# Patient Record
Sex: Female | Born: 1955
Health system: Southern US, Community
[De-identification: ages and names within clinical notes are randomized; demographics above are authoritative.]

## PROBLEM LIST (undated history)

## (undated) DIAGNOSIS — R32 Unspecified urinary incontinence: Secondary | ICD-10-CM

## (undated) DIAGNOSIS — I10 Essential (primary) hypertension: Secondary | ICD-10-CM

## (undated) DIAGNOSIS — E611 Iron deficiency: Secondary | ICD-10-CM

## (undated) DIAGNOSIS — Z6791 Unspecified blood type, Rh negative: Secondary | ICD-10-CM

## (undated) DIAGNOSIS — M1990 Unspecified osteoarthritis, unspecified site: Secondary | ICD-10-CM

## (undated) DIAGNOSIS — B019 Varicella without complication: Secondary | ICD-10-CM

## (undated) DIAGNOSIS — K219 Gastro-esophageal reflux disease without esophagitis: Secondary | ICD-10-CM

## (undated) HISTORY — DX: Unspecified osteoarthritis, unspecified site: M19.90

## (undated) HISTORY — PX: TUBAL LIGATION: SHX77

## (undated) HISTORY — DX: Varicella without complication: B01.9

## (undated) HISTORY — DX: Essential (primary) hypertension: I10

## (undated) HISTORY — DX: Unspecified blood type, rh negative: Z67.91

## (undated) HISTORY — DX: Gastro-esophageal reflux disease without esophagitis: K21.9

## (undated) HISTORY — DX: Iron deficiency: E61.1

## (undated) HISTORY — DX: Unspecified urinary incontinence: R32

---

## 2005-06-26 ENCOUNTER — Other Ambulatory Visit: Admission: RE | Admit: 2005-06-26 | Discharge: 2005-06-26 | Payer: Self-pay | Admitting: Family Medicine

## 2007-03-06 ENCOUNTER — Encounter: Admission: RE | Admit: 2007-03-06 | Discharge: 2007-03-06 | Payer: Self-pay | Admitting: Family Medicine

## 2007-04-02 ENCOUNTER — Encounter: Admission: RE | Admit: 2007-04-02 | Discharge: 2007-04-02 | Payer: Self-pay | Admitting: Family Medicine

## 2008-03-12 ENCOUNTER — Encounter: Admission: RE | Admit: 2008-03-12 | Discharge: 2008-03-12 | Payer: Self-pay | Admitting: Family Medicine

## 2009-03-14 ENCOUNTER — Encounter: Admission: RE | Admit: 2009-03-14 | Discharge: 2009-03-14 | Payer: Self-pay | Admitting: Family Medicine

## 2009-03-17 ENCOUNTER — Encounter: Admission: RE | Admit: 2009-03-17 | Discharge: 2009-03-17 | Payer: Self-pay | Admitting: Family Medicine

## 2010-03-20 ENCOUNTER — Encounter: Admission: RE | Admit: 2010-03-20 | Discharge: 2010-03-20 | Payer: Self-pay | Admitting: Family Medicine

## 2011-02-14 ENCOUNTER — Other Ambulatory Visit: Payer: Self-pay | Admitting: Family Medicine

## 2011-02-14 DIAGNOSIS — Z1231 Encounter for screening mammogram for malignant neoplasm of breast: Secondary | ICD-10-CM

## 2011-03-22 ENCOUNTER — Ambulatory Visit
Admission: RE | Admit: 2011-03-22 | Discharge: 2011-03-22 | Disposition: A | Discharge status: Home / Self Care | Payer: 59 | Source: Ambulatory Visit | Attending: Family Medicine | Admitting: Family Medicine

## 2011-03-22 DIAGNOSIS — Z1231 Encounter for screening mammogram for malignant neoplasm of breast: Secondary | ICD-10-CM

## 2011-03-23 ENCOUNTER — Other Ambulatory Visit: Payer: Self-pay | Admitting: Family Medicine

## 2011-03-23 DIAGNOSIS — N63 Unspecified lump in unspecified breast: Secondary | ICD-10-CM

## 2011-03-29 ENCOUNTER — Ambulatory Visit
Admission: RE | Admit: 2011-03-29 | Discharge: 2011-03-29 | Disposition: A | Discharge status: Home / Self Care | Payer: 59 | Source: Ambulatory Visit | Attending: Family Medicine | Admitting: Family Medicine

## 2011-03-29 ENCOUNTER — Other Ambulatory Visit: Payer: Self-pay | Admitting: Family Medicine

## 2011-03-29 DIAGNOSIS — N63 Unspecified lump in unspecified breast: Secondary | ICD-10-CM

## 2012-01-09 ENCOUNTER — Encounter: Payer: Self-pay | Admitting: Obstetrics and Gynecology

## 2012-01-09 ENCOUNTER — Ambulatory Visit (INDEPENDENT_AMBULATORY_CARE_PROVIDER_SITE_OTHER): Payer: 59 | Admitting: Obstetrics and Gynecology

## 2012-01-09 VITALS — BP 122/80 | Temp 98.9°F | Resp 16 | Ht 62.0 in | Wt 132.0 lb

## 2012-01-09 DIAGNOSIS — D219 Benign neoplasm of connective and other soft tissue, unspecified: Secondary | ICD-10-CM

## 2012-01-09 DIAGNOSIS — D259 Leiomyoma of uterus, unspecified: Secondary | ICD-10-CM

## 2012-01-09 NOTE — Patient Instructions (Addendum)
Call in 1 week for followup       Fibroids You have been diagnosed as having a fibroid. Fibroids are smooth muscle lumps (tumors) which can occur any place in a woman's body. They are usually in the womb (uterus). The most common problem (symptom) of fibroids is bleeding. Over time this may cause low red blood cells (anemia). Other symptoms include feelings of pressure and pain in the pelvis. The diagnosis (learning what is wrong) of fibroids is made by physical exam. Sometimes tests such as an ultrasound are used. This is helpful when fibroids are felt around the ovaries and to look for tumors. TREATMENT   Most fibroids do not need surgical or medical treatment. Sometimes a tissue sample (biopsy) of the lining of the uterus is done to rule out cancer. If there is no cancer and only a small amount of bleeding, the problem can be watched.   Hormonal treatment can improve the problem.   When surgery is needed, it can consist of removing the fibroid. Vaginal birth may not be possible after the removal of fibroids. This depends on where they are and the extent of surgery. When pregnancy occurs with fibroids it is usually normal.   Your caregiver can help decide which treatments are best for you.  HOME CARE INSTRUCTIONS   Do not use aspirin as this may increase bleeding problems.   If your periods (menses) are heavy, record the number of pads or tampons used per month. Bring this information to your caregiver. This can help them determine the best treatment for you.  SEEK IMMEDIATE MEDICAL CARE IF:  You have pelvic pain or cramps not controlled with medications, or experience a sudden increase in pain.   You have an increase of pelvic bleeding between and during menses.   You feel lightheaded or have fainting spells.   You develop worsening belly (abdominal) pain.  Document Released: 08/10/2000 Document Revised: 08/02/2011 Document Reviewed: 04/01/2008 [facility] Patient Information 2012  [place], [place].

## 2012-01-09 NOTE — Progress Notes (Signed)
Vag. Discharge:no Odor:no Fever:no Irreg.Periods:yes Dyspareunia:yes Dysuria:no Frequency:no Urgency:no Hematuria:no Kidney stones:no Constipation:no Diarrhea:no Rectal Bleeding: no Vomiting:no Nausea:no Pregnant:no Fibroids:yes Endometriosis:no Hx of Ovarian Cyst:no Hx IUD:no Hx STD-PID:no Appendectomy:no Gall Bladder Dz:no  Subjective:    Samantha Becker is a 56 y.o. female G4P4 who presents for annual exam.  The patient complains of a 3 month history of deep dyspareunia. She had had some vaginal dryness over the last year or so but had managed this symptom with vaginal lubrication. More recently however the patient has experienced severe pain on deep penetration. She saw her primary care physician at [facility] family practice who ordered an ultrasound that showed fibroids. Her last menstrual period begin on 113 and was very heavy and long her previous menstrual period had been in October of 2011. She had no intermenstrual bleeding she has noticed increased constipation since menopause began. She denies any rectal bleeding. She denies nausea or vomiting. She does have urinary frequency and some mild stress urinary incontinence.  The following portions of the patient's history were reviewed and updated as appropriate: allergies, current medications, past family history, past medical history, past social history, past surgical history and problem list.  Review of Systems Pertinent items are noted in HPI. Gastrointestinal:No change in bowel habits, no abdominal pain, no rectal bleeding Genitourinary:negative for dysuria, frequency, hematuria, nocturia and urinary incontinence    Objective:     BP 122/80  Temp 98.9 F (37.2 C)  Resp 16  Ht 5\' 2"  (1.575 m)  Wt 132 lb (59.875 kg)  BMI 24.14 kg/m2  Weight:  Wt Readings from Last 1 Encounters:  01/09/12 132 lb (59.875 kg)     BMI: Body mass index is 24.14 kg/(m^2). General Appearance: Alert, appropriate appearance for  age. No acute distress HEENT: Grossly normal Neck / Thyroid: Supple, no masses, nodes or enlargement Lungs: clear to auscultation bilaterally Back: No CVA tenderness Breast Exam: No masses or nodes.No dimpling, nipple retraction or discharge. Cardiovascular: Regular rate and rhythm. S1, S2, no murmur Gastrointestinal: Soft, non-tender, no masses or organomegaly Pelvic Exam: External genitalia: normal general appearance Vaginal: normal mucosa without prolapse or lesions Cervix: normal appearance Adnexa: non palpable Uterus: enlarged and nontender and mobile Rectovaginal: no masses Lymphatic Exam: Non-palpable nodes in neck, clavicular, axillary, or inguinal regions  Skin: no rash or abnormalities Neurologic: Normal gait and speech, no tremor  Psychiatric: Alert and oriented, appropriate affect.    Urinalysis:Not done      Assessment:    Fibroids by history with recent onset of dyspareunia. This is out of proportion to uterine size noted on pelvic examination.    Plan:    Release of information for ultrasound obtained at [facility] was sent. This will be reviewed and further followup recommended based on that finding.  pap Follow-up:  prn

## 2012-01-12 LAB — PAP IG AND HPV HIGH-RISK: HPV DNA High Risk: NOT DETECTED

## 2012-01-24 ENCOUNTER — Telehealth: Payer: Self-pay | Admitting: Obstetrics and Gynecology

## 2012-01-24 NOTE — Telephone Encounter (Signed)
chandra/epic 

## 2012-01-25 NOTE — Telephone Encounter (Signed)
Lm on vm to cb per telephone call.  

## 2012-01-25 NOTE — Telephone Encounter (Signed)
Tc from pt rgdg ultrasound results ordered by Dr.Meyers. Pt wants vh to review ultrasound and call back with any further recs. Will consult with vph and cb with recs. Pt agrees. Pt was informed that no cyst was found on ovaries and small fibroids present. Pt with h/o fibroids and voices understanding.

## 2012-01-29 NOTE — Telephone Encounter (Signed)
Will make vph aware.

## 2012-01-29 NOTE — Telephone Encounter (Signed)
Lm on vm to cb per vph recs.  

## 2012-01-29 NOTE — Telephone Encounter (Signed)
Tc from pt. Told pt recs from vph as follows: Ultrasound are very small and probably do not accounted for the Discomfort with intercourse. I would like to have the patient consider vaginal estrogen in the form of Vagifem 10 mcg vaginally 2 times per week for 6 weeks. If the patient would prefer to discuss the pros and cons of this recommendation please make an appointment for her if she wishes to proceed with this recommendation please call in a prescription for #8 with 3 refills and make an appointment for 6 weeks for her. Pt states,"this discomfort is coming from something that her husbands penis touches with intercourse. Pt has tried Premarin cream in past w/o improvement. Will consult with vph per recs. Pt agrees

## 2012-01-31 NOTE — Telephone Encounter (Signed)
Tc to pt per vph recs. Appt sched 02/01/12@11 :00 with vph for eval. Lm on pt's to make aware.

## 2012-02-01 ENCOUNTER — Encounter: Payer: Self-pay | Admitting: Obstetrics and Gynecology

## 2012-02-01 ENCOUNTER — Ambulatory Visit (INDEPENDENT_AMBULATORY_CARE_PROVIDER_SITE_OTHER): Payer: 59 | Admitting: Obstetrics and Gynecology

## 2012-02-01 VITALS — BP 124/72 | Temp 98.7°F | Ht 62.0 in | Wt 132.0 lb

## 2012-02-01 DIAGNOSIS — IMO0002 Reserved for concepts with insufficient information to code with codable children: Secondary | ICD-10-CM | POA: Insufficient documentation

## 2012-02-01 DIAGNOSIS — K5900 Constipation, unspecified: Secondary | ICD-10-CM

## 2012-02-01 MED ORDER — POLYETHYLENE GLYCOL 3350 17 GM/SCOOP PO POWD: 17.0000 g | Freq: Every day | ORAL | Status: AC

## 2012-02-01 MED ORDER — POLYETHYLENE GLYCOL 3350 17 GM/SCOOP PO POWD: 17.0000 g | Freq: Every day | ORAL | Status: DC

## 2012-02-01 NOTE — Patient Instructions (Signed)
Miralax over the counter daily.

## 2012-02-01 NOTE — Progress Notes (Signed)
Pt here to discuss dyspareunia. Present in supine position for intercourse, but not in the sitting or prone position. New bowel changes occurred at about the same time as onset of dyspareunia.  Now has daily BMs but they are hard pellets. Has had BM this am.  OBJECTIVE: BP 124/72  Temp(Src) 98.7 F (37.1 C) (Oral)  Ht 5\' 2"  (1.575 m)  Wt 132 lb (59.875 kg)  BMI 24.14 kg/m2 Pelvic exam: VULVA: normal appearing vulva with no masses, tenderness or lesions,    VAGINA: normal appearing vagina with normal color and discharge, no lesions,    CERVIX: normal appearing cervix without discharge or lesions, lesion present at 6 o'clock position, bluish in color, and pt states is tender   UTERUS: upper limits of normal size, irregular, mobile, tender to palpation of cervix,no uterosacral tenderness    ADNEXA: no masses,    RECTAL: normal rectal, no masses, no rectocele, firm stool palpated in the lumen and in more superior area of the sigmoid.  Procedure:  Excisional biopsy of blueish lesion with Kevorkian forceps, after prep with Betadine.  AgNO3 for hemostasis  IMPRESSION: Dyspareunia out of proportion to anatomic lesion of fibroids Cannot rule out comstipation with tender residual stool as etiology Cervical lesion, r/o endometriosis.  Excisional biopsy done.  RECOMMENDATIONS: Miralax daily. Decrease to qod if diarrhea occurs. Cx Bx done. F/U 4 weeks

## 2012-02-05 LAB — PATHOLOGY

## 2012-02-11 ENCOUNTER — Telehealth: Payer: Self-pay | Admitting: Obstetrics and Gynecology

## 2012-02-11 NOTE — Telephone Encounter (Signed)
Chandra/tst res. °

## 2012-02-12 NOTE — Telephone Encounter (Signed)
Lm on vm to cb per telephone call.  

## 2012-02-12 NOTE — Telephone Encounter (Signed)
Tc from pt. Pt told cervical biopsy=negative for malignancy. Will consult with vph rgdg any additional recs for cervical biopsy. Pt voices understanding.

## 2012-02-25 ENCOUNTER — Telehealth: Payer: Self-pay

## 2012-02-25 NOTE — Telephone Encounter (Signed)
Tc to pt per vph recs. Pt informed with discuss plan of care at 02-26-12 OV. Cervical biopsy was performed to determine if endometriosis present and it was not. Pt voices understanding.

## 2012-02-26 ENCOUNTER — Encounter: Payer: Self-pay | Admitting: Obstetrics and Gynecology

## 2012-02-26 ENCOUNTER — Ambulatory Visit (INDEPENDENT_AMBULATORY_CARE_PROVIDER_SITE_OTHER): Payer: 59 | Admitting: Obstetrics and Gynecology

## 2012-02-26 VITALS — BP 122/62 | Ht 62.0 in | Wt 134.0 lb

## 2012-02-26 DIAGNOSIS — D219 Benign neoplasm of connective and other soft tissue, unspecified: Secondary | ICD-10-CM

## 2012-02-26 DIAGNOSIS — D259 Leiomyoma of uterus, unspecified: Secondary | ICD-10-CM

## 2012-02-26 DIAGNOSIS — IMO0002 Reserved for concepts with insufficient information to code with codable children: Secondary | ICD-10-CM

## 2012-02-26 NOTE — Progress Notes (Signed)
Pt here to f/u on visit from 02/01/2012 for dyspareunia. States that she is still having the same issues even after improved bowel function  PATHOLOGY REPORT Specimen shows benign cervical mucosa. The subepithelial tissue shows benign endocervical glands, mild chronic inflammation, hemosiderin, and hemosiderin-laden macrophages. No definite endometrial glands or endometrial stromal cells are identified.   ULTRASOUND Small (7mm) fibroids, normal ovaries  IMPRESSION Deep dyspareunia of uncertain etiology No clear evidence of endometriosis though cervical biopsy suggestive.  RECOMMENDATION We had a prolonged discussion about these complex clinical issues and went over the various important aspects to consider. All questions were answered. Options of laparoscopy with resection of as much endometriosis as possible, and presumptive medical therapy of endometriosis reviewed.  The pt wants laparoscopy.  Risks of anesthesia, bleeding , infection, damage to adjacent organs, as well as the risk of failing to find an etiology for her dyspareunia were reviewed.  She gave the following schedule for timing her surgery: Teaching M-Th 7/22thru 8-2   [id number]   Starts back to [id number] Will schedule Printed information given

## 2012-02-27 ENCOUNTER — Telehealth: Payer: Self-pay | Admitting: Obstetrics and Gynecology

## 2012-02-27 ENCOUNTER — Other Ambulatory Visit: Payer: Self-pay | Admitting: Obstetrics and Gynecology

## 2012-02-27 NOTE — Telephone Encounter (Signed)
Operative Laparoscopy Scheduled for 03/04/12 @ 1:30 with [facility]. [employer] effective 08/27/09; pays 80/20 after a $2,850 deductible. Pre-op due $468.47; paid 02/27/12. Merita Norton Pridgen

## 2012-03-01 ENCOUNTER — Encounter (HOSPITAL_COMMUNITY): Payer: Self-pay | Admitting: Pharmacist

## 2012-03-03 ENCOUNTER — Encounter (HOSPITAL_COMMUNITY): Payer: Self-pay

## 2012-03-03 ENCOUNTER — Encounter (HOSPITAL_COMMUNITY)
Admission: RE | Admit: 2012-03-03 | Discharge: 2012-03-03 | Disposition: A | Discharge status: Home / Self Care | Payer: 59 | Source: Ambulatory Visit | Attending: Obstetrics and Gynecology | Admitting: Obstetrics and Gynecology

## 2012-03-03 LAB — SURGICAL PCR SCREEN
MRSA, PCR: NEGATIVE
Staphylococcus aureus: NEGATIVE

## 2012-03-03 LAB — CBC
MCH: 28.2 pg (ref 26.0–34.0)
MCV: 87.5 fL (ref 78.0–100.0)
Platelets: 230 10*3/uL (ref 150–400)
RDW: 13 % (ref 11.5–15.5)
WBC: 6.6 10*3/uL (ref 4.0–10.5)

## 2012-03-03 NOTE — H&P (Signed)
Samantha Becker is an 56 y.o. female. Who presents for further evaluation of deep dyspareunia of less than 6 months duration that has gradually worsened. She had her LMP 1/13, BUT PMP had been 10/11 with some vaginal dryness after that which responded to OTC lubricants.  The sharp pain noted on deep penetration is very different than that. Ultrasound ordered by her primary care physician showed a small uterine fibroid.  Improved bowel function with fiber addition has not helped.    Pertinent Gynecological History: Menses: see above Bleeding: None since 1/13 Contraception: tubal ligation DES exposure: unknown Blood transfusions: none Sexually transmitted diseases: no past history Previous GYN Procedures: 4 cesarean sections   Last mammogram: Left breast with simple cyst and both breasts heterogeneously dense Date: July 2012 Last pap: Normal with negative HR HPV May 2013 OB History: G4, P4   Menstrual History: Menarche age: 45 LMP  1/13, PMP 10/11    Past Medical History  Diagnosis Date  . Low iron   . Leaking of urine   . Chicken pox   . Mumps   . Arthritis   . Blood type, Rh negative     Past Surgical History  Procedure Date  . Cesarean section     Family History  Problem Relation Age of Onset  . Heart disease Father   . Hypertension Father   . Heart disease Mother   . Cancer Mother     Breast  . Hypertension Mother   . Arthritis Mother     in hands.    Social History:  reports that she has never smoked. She has never used smokeless tobacco. She reports that she drinks about 1.2 ounces of alcohol per week. She reports that she does not use illicit drugs.  Allergies: No Known Allergies  No prescriptions prior to admission    Review of Systems  Constitutional: Negative.   HENT: Negative.   Eyes: Negative.   Respiratory: Negative.   Cardiovascular: Negative.   Gastrointestinal: Negative.   Genitourinary:       Deep dyspareunia  Musculoskeletal:   Pelvic pain with intercourse can radiate into back  Skin: Negative.   Neurological: Negative.   Endo/Heme/Allergies: Negative.     Blood pressure 137/80, pulse 73, temperature 98.7 F (37.1 C), temperature source Oral, resp. rate 16, SpO2 98.00%. Physical Exam  Constitutional: She is oriented to person, place, and time. She appears well-developed and well-nourished.  HENT:  Head: Normocephalic and atraumatic.  Eyes: Conjunctivae and EOM are normal.  Neck: Normal range of motion. Neck supple.  Cardiovascular: Normal rate, regular rhythm and normal heart sounds.   Respiratory: Effort normal and breath sounds normal.  GI: Soft. Bowel sounds are normal.  Genitourinary: Vagina normal and uterus normal.       Cervix with blue lesion, negative for endometriosis on biopsy Posterior cervical tenderness to deep palpation No specific uterosacral nodulatity No rectovaginal septal masses  Musculoskeletal: Normal range of motion.  Neurological: She is alert and oriented to person, place, and time.  Skin: Skin is warm and dry.  Psychiatric: She has a normal mood and affect.    No results found for this or any previous visit (from the past 24 hour(s)).  PATHOLOGY REPORT cervical biopsy Specimen shows benign cervical mucosa. The subepithelial tissue shows benign endocervical glands, mild chronic inflammation, hemosiderin, and hemosiderin-laden macrophages. No definite endometrial glands or endometrial stromal cells are identified.   ULTRASOUND  Small (7mm) fibroids, normal ovaries   IMPRESSION  Deep dyspareunia of  uncertain etiology  No clear evidence of endometriosis though cervical biopsy suggestive.   RECOMMENDATION  We had a prolonged discussion about these complex clinical issues and went over the various important aspects to consider. All questions were answered.  Options of laparoscopy with resection of as much endometriosis as possible, and presumptive medical therapy of  endometriosis reviewed. The pt wants laparoscopy. Risks of anesthesia, bleeding , infection, damage to adjacent organs, as well as the risk of failing to find an etiology for her dyspareunia were reviewed.   Dejanay Wamboldt P 03/04/2012, 3:22 PM

## 2012-03-03 NOTE — Patient Instructions (Addendum)
YOUR PROCEDURE IS SCHEDULED ON:03/04/12  [facility] AT:1200 pm  USE DESK PHONE AND DIAL [id number] TO INFORM us OF YOUR ARRIVAL  CALL [phone] IF YOU HAVE ANY QUESTIONS OR PROBLEMS PRIOR TO YOUR ARRIVAL.  REMEMBER: DO NOT EAT AFTER MIDNIGHT : tonight   SPECIAL INSTRUCTIONS:clear liquids ok until 9am on Tuesday   YOU MAY BRUSH YOUR TEETH THE MORNING OF SURGERY   TAKE THESE MEDICINES THE DAY OF SURGERY WITH SIP OF WATER: none   DO NOT WEAR JEWELRY, EYE MAKEUP, LIPSTICK OR DARK FINGERNAIL POLISH DO NOT WEAR LOTIONS  DO NOT SHAVE FOR 48 HOURS PRIOR TO SURGERY  YOU WILL NOT BE ALLOWED TO DRIVE YOURSELF HOME.

## 2012-03-04 ENCOUNTER — Ambulatory Visit (HOSPITAL_COMMUNITY): Payer: 59 | Admitting: Anesthesiology

## 2012-03-04 ENCOUNTER — Encounter (HOSPITAL_COMMUNITY)
Admission: RE | Disposition: A | Discharge status: Home / Self Care | Payer: Self-pay | Source: Ambulatory Visit | Attending: Obstetrics and Gynecology

## 2012-03-04 ENCOUNTER — Ambulatory Visit (HOSPITAL_COMMUNITY)
Admission: RE | Admit: 2012-03-04 | Discharge: 2012-03-04 | Disposition: A | Discharge status: Home / Self Care | Payer: 59 | Source: Ambulatory Visit | Attending: Obstetrics and Gynecology | Admitting: Obstetrics and Gynecology

## 2012-03-04 ENCOUNTER — Encounter (HOSPITAL_COMMUNITY): Payer: Self-pay | Admitting: Anesthesiology

## 2012-03-04 ENCOUNTER — Encounter (HOSPITAL_COMMUNITY): Payer: Self-pay | Admitting: Obstetrics and Gynecology

## 2012-03-04 DIAGNOSIS — Z01818 Encounter for other preprocedural examination: Secondary | ICD-10-CM | POA: Insufficient documentation

## 2012-03-04 DIAGNOSIS — D259 Leiomyoma of uterus, unspecified: Secondary | ICD-10-CM | POA: Insufficient documentation

## 2012-03-04 DIAGNOSIS — IMO0002 Reserved for concepts with insufficient information to code with codable children: Secondary | ICD-10-CM

## 2012-03-04 DIAGNOSIS — Z01812 Encounter for preprocedural laboratory examination: Secondary | ICD-10-CM | POA: Insufficient documentation

## 2012-03-04 HISTORY — PX: LAPAROSCOPY: SHX197

## 2012-03-04 SURGERY — LAPAROSCOPY OPERATIVE
Anesthesia: General | Site: Abdomen | Wound class: Clean

## 2012-03-04 MED ORDER — DEXAMETHASONE SODIUM PHOSPHATE 10 MG/ML IJ SOLN
INTRAMUSCULAR | Status: AC
  Filled 2012-03-04: qty 1

## 2012-03-04 MED ORDER — ROCURONIUM BROMIDE 100 MG/10ML IV SOLN
INTRAVENOUS | Status: DC | PRN
  Administered 2012-03-04: 5 mg via INTRAVENOUS
  Administered 2012-03-04: 30 mg via INTRAVENOUS
  Administered 2012-03-04: 5 mg via INTRAVENOUS

## 2012-03-04 MED ORDER — GLYCOPYRROLATE 0.2 MG/ML IJ SOLN
INTRAMUSCULAR | Status: AC
  Filled 2012-03-04: qty 1

## 2012-03-04 MED ORDER — IBUPROFEN 600 MG PO TABS: ORAL_TABLET | ORAL | Status: DC

## 2012-03-04 MED ORDER — ONDANSETRON 4 MG PO TBDP
4.0000 mg | ORAL_TABLET | Freq: Once | ORAL | Status: AC
  Administered 2012-03-04: 4 mg via ORAL

## 2012-03-04 MED ORDER — FENTANYL CITRATE 0.05 MG/ML IJ SOLN
INTRAMUSCULAR | Status: DC | PRN
  Administered 2012-03-04: 150 ug via INTRAVENOUS
  Administered 2012-03-04 (×2): 50 ug via INTRAVENOUS

## 2012-03-04 MED ORDER — SCOPOLAMINE 1 MG/3DAYS TD PT72
1.0000 | MEDICATED_PATCH | TRANSDERMAL | Status: DC
  Administered 2012-03-04: 1.5 mg via TRANSDERMAL

## 2012-03-04 MED ORDER — ONDANSETRON HCL 4 MG/2ML IJ SOLN
INTRAMUSCULAR | Status: DC | PRN
  Administered 2012-03-04: 4 mg via INTRAVENOUS

## 2012-03-04 MED ORDER — KETOROLAC TROMETHAMINE 60 MG/2ML IM SOLN
INTRAMUSCULAR | Status: DC | PRN
  Administered 2012-03-04: 30 mg via INTRAMUSCULAR

## 2012-03-04 MED ORDER — SCOPOLAMINE 1 MG/3DAYS TD PT72
MEDICATED_PATCH | TRANSDERMAL | Status: AC
  Administered 2012-03-04: 1.5 mg via TRANSDERMAL
  Filled 2012-03-04: qty 1

## 2012-03-04 MED ORDER — PROPOFOL 10 MG/ML IV EMUL
INTRAVENOUS | Status: AC
  Filled 2012-03-04: qty 20

## 2012-03-04 MED ORDER — MIDAZOLAM HCL 2 MG/2ML IJ SOLN
INTRAMUSCULAR | Status: AC
  Filled 2012-03-04: qty 2

## 2012-03-04 MED ORDER — MIDAZOLAM HCL 5 MG/5ML IJ SOLN
INTRAMUSCULAR | Status: DC | PRN
  Administered 2012-03-04: 2 mg via INTRAVENOUS

## 2012-03-04 MED ORDER — ROCURONIUM BROMIDE 50 MG/5ML IV SOLN
INTRAVENOUS | Status: AC
  Filled 2012-03-04: qty 1

## 2012-03-04 MED ORDER — HYDROCODONE-ACETAMINOPHEN 5-500 MG PO TABS: 1.0000 | ORAL_TABLET | Freq: Four times a day (QID) | ORAL | Status: AC | PRN

## 2012-03-04 MED ORDER — ONDANSETRON 4 MG PO TBDP
ORAL_TABLET | ORAL | Status: AC
  Administered 2012-03-04: 4 mg via ORAL
  Filled 2012-03-04: qty 1

## 2012-03-04 MED ORDER — NEOSTIGMINE METHYLSULFATE 1 MG/ML IJ SOLN
INTRAMUSCULAR | Status: AC
  Filled 2012-03-04: qty 10

## 2012-03-04 MED ORDER — KETOROLAC TROMETHAMINE 30 MG/ML IJ SOLN
INTRAMUSCULAR | Status: DC | PRN
  Administered 2012-03-04: 30 mg via INTRAVENOUS

## 2012-03-04 MED ORDER — PROPOFOL 10 MG/ML IV EMUL
INTRAVENOUS | Status: DC | PRN
  Administered 2012-03-04: 150 mg via INTRAVENOUS

## 2012-03-04 MED ORDER — FENTANYL CITRATE 0.05 MG/ML IJ SOLN
INTRAMUSCULAR | Status: AC
  Filled 2012-03-04: qty 5

## 2012-03-04 MED ORDER — METOCLOPRAMIDE HCL 5 MG/ML IJ SOLN: 10.0000 mg | Freq: Once | INTRAMUSCULAR | Status: DC | PRN

## 2012-03-04 MED ORDER — MEPERIDINE HCL 25 MG/ML IJ SOLN: 6.2500 mg | INTRAMUSCULAR | Status: DC | PRN

## 2012-03-04 MED ORDER — ONDANSETRON HCL 4 MG/2ML IJ SOLN
INTRAMUSCULAR | Status: AC
  Filled 2012-03-04: qty 2

## 2012-03-04 MED ORDER — DEXAMETHASONE SODIUM PHOSPHATE 10 MG/ML IJ SOLN
INTRAMUSCULAR | Status: DC | PRN
  Administered 2012-03-04: 10 mg via INTRAVENOUS

## 2012-03-04 MED ORDER — LACTATED RINGERS IV SOLN
INTRAVENOUS | Status: DC
  Administered 2012-03-04 (×2): via INTRAVENOUS

## 2012-03-04 MED ORDER — GLYCOPYRROLATE 0.2 MG/ML IJ SOLN
INTRAMUSCULAR | Status: AC
  Filled 2012-03-04: qty 2

## 2012-03-04 MED ORDER — FENTANYL CITRATE 0.05 MG/ML IJ SOLN: 25.0000 ug | INTRAMUSCULAR | Status: DC | PRN

## 2012-03-04 MED ORDER — GLYCOPYRROLATE 0.2 MG/ML IJ SOLN
INTRAMUSCULAR | Status: DC | PRN
  Administered 2012-03-04: .8 mg via INTRAVENOUS
  Administered 2012-03-04: 0.1 mg via INTRAVENOUS

## 2012-03-04 MED ORDER — LIDOCAINE HCL (CARDIAC) 20 MG/ML IV SOLN
INTRAVENOUS | Status: DC | PRN
  Administered 2012-03-04: 80 mg via INTRAVENOUS

## 2012-03-04 MED ORDER — NEOSTIGMINE METHYLSULFATE 1 MG/ML IJ SOLN
INTRAMUSCULAR | Status: DC | PRN
  Administered 2012-03-04: 4 mg via INTRAVENOUS

## 2012-03-04 MED ORDER — BUPIVACAINE HCL (PF) 0.25 % IJ SOLN
INTRAMUSCULAR | Status: DC | PRN
  Administered 2012-03-04: 6 mL

## 2012-03-04 MED ORDER — LIDOCAINE HCL (CARDIAC) 20 MG/ML IV SOLN
INTRAVENOUS | Status: AC
  Filled 2012-03-04: qty 5

## 2012-03-04 MED ORDER — BUPIVACAINE HCL (PF) 0.25 % IJ SOLN
INTRAMUSCULAR | Status: AC
  Filled 2012-03-04: qty 30

## 2012-03-04 SURGICAL SUPPLY — 23 items
ADH SKN CLS APL DERMABOND .7 (GAUZE/BANDAGES/DRESSINGS) ×2
CABLE HIGH FREQUENCY MONO STRZ (ELECTRODE) ×1 IMPLANT
CHLORAPREP W/TINT 26ML (MISCELLANEOUS) ×2 IMPLANT
CLOTH BEACON ORANGE TIMEOUT ST (SAFETY) ×2 IMPLANT
CONTAINER PREFILL 10% NBF 15ML (MISCELLANEOUS) ×1 IMPLANT
DERMABOND ADVANCED (GAUZE/BANDAGES/DRESSINGS) ×2
DERMABOND ADVANCED .7 DNX12 (GAUZE/BANDAGES/DRESSINGS) IMPLANT
DRESSING TELFA 8X3 (GAUZE/BANDAGES/DRESSINGS) ×1 IMPLANT
GLOVE SURG SS PI 6.5 STRL IVOR (GLOVE) ×4 IMPLANT
GOWN PREVENTION PLUS LG XLONG (DISPOSABLE) ×6 IMPLANT
NS IRRIG 1000ML POUR BTL (IV SOLUTION) ×2 IMPLANT
PACK LAPAROSCOPY BASIN (CUSTOM PROCEDURE TRAY) ×2 IMPLANT
PROTECTOR NERVE ULNAR (MISCELLANEOUS) ×3 IMPLANT
SET IRRIG TUBING LAPAROSCOPIC (IRRIGATION / IRRIGATOR) IMPLANT
SUT VIC AB 3-0 PS2 18 (SUTURE)
SUT VIC AB 3-0 PS2 18XBRD (SUTURE) IMPLANT
SUT VICRYL 0 UR6 27IN ABS (SUTURE) ×2 IMPLANT
SUT VICRYL RAPIDE 3 0 (SUTURE) ×1 IMPLANT
TOWEL OR 17X24 6PK STRL BLUE (TOWEL DISPOSABLE) ×4 IMPLANT
TRAY FOLEY CATH 14FR (SET/KITS/TRAYS/PACK) ×2 IMPLANT
TROCAR BALL TOP DISP 5MM (ENDOMECHANICALS) ×2 IMPLANT
WARMER LAPAROSCOPE (MISCELLANEOUS) ×2 IMPLANT
WATER STERILE IRR 1000ML POUR (IV SOLUTION) ×2 IMPLANT

## 2012-03-04 NOTE — Anesthesia Preprocedure Evaluation (Signed)
Anesthesia Evaluation  Patient identified by MRN, date of birth, ID band Patient awake    Reviewed: Allergy & Precautions, H&P , NPO status , Patient's Chart, lab work & pertinent test results  Airway Mallampati: III TM Distance: >3 FB Neck ROM: Full    Dental No notable dental hx. (+) Teeth Intact   Pulmonary neg pulmonary ROS,  breath sounds clear to auscultation  Pulmonary exam normal       Cardiovascular negative cardio ROS  Rhythm:Regular Rate:Normal     Neuro/Psych negative neurological ROS  negative psych ROS   GI/Hepatic negative GI ROS, Neg liver ROS,   Endo/Other  negative endocrine ROS  Renal/GU negative Renal ROS   Dyspareunia     Musculoskeletal  (+) Arthritis -, Osteoarthritis,    Abdominal   Peds  Hematology negative hematology ROS (+)   Anesthesia Other Findings   Reproductive/Obstetrics negative OB ROS                           Anesthesia Physical Anesthesia Plan  ASA: II  Anesthesia Plan: General   Post-op Pain Management:    Induction: Intravenous  Airway Management Planned: Oral ETT  Additional Equipment:   Intra-op Plan:   Post-operative Plan: Extubation in OR  Informed Consent: I have reviewed the patients History and Physical, chart, labs and discussed the procedure including the risks, benefits and alternatives for the proposed anesthesia with the patient or authorized representative who has indicated his/her understanding and acceptance.   Dental advisory given  Plan Discussed with: CRNA, Anesthesiologist and Surgeon  Anesthesia Plan Comments:         Anesthesia Quick Evaluation

## 2012-03-04 NOTE — Op Note (Signed)
Diagnostic Laparoscopy Procedure Note  Indications: The patient is a 56 y.o. female with dyspareunia  Pre-operative Diagnosis: dyspareunia  Post-operative Diagnosis: same plus pelvic adhesions  Surgeon: Dierdre Forth P   Assistants: n/a  Anesthesia: General endotracheal anesthesia  ASA Class: 2  Procedure Details  The patient was seen in the Holding Room. The risks, benefits, complications, treatment options, and expected outcomes had been discussed with the patient, and she denied any further questions. The possibilities of reaction to medication, pulmonary aspiration, perforation of viscus, bleeding, recurrent infection, the need for additional procedures, failure to diagnose a condition, and creating a complication requiring transfusion or operation were discussed with the patient. The patient concurred with the proposed plan, giving informed consent. The patient was taken to the Operating Room, identified as [place] and the procedure verified as Diagnostic Laparoscopy. A Time Out was held and the above information confirmed.  After induction of general anesthesia, the patient was placed in modified dorsal lithotomy position where she was prepped, draped, and catheterized in the normal, sterile fashion with a Foley catheter remaining in place.  The cervix was visualized and an intrauterine manipulator was placed. A subumbilicall incision was then performed, and taken down to the fascia. The fascia was incised and marked with a suture of 0 Vicryl. The peritoneum was entered and the [facility] cannula placed into the peritoneal cavity under direct visualization.A pneumoperitoneum was established. The above findings were noted. B. Adhesion connecting the anterior abdominal wall to the anterior fundus of the uterus was lysed and removed with hemostasis achieved with monopolar cautery. The posterior cul-de-sac was carefully evaluated and a biopsy taken at the uterosacral ligament at  its midline. The stasis was likewise achieved with cautery and 10 cc of quarter percent Marcaine was instilled.  Following the procedure the umbilical sheath was removed after intra-abdominal carbon dioxide was expressed. The fascial incision was closed with the marking sutures of 0 Vicryl in a figure-of-eight fashion. The skin was closed with subcuticular sutures of 4-0 Vicryl. The intrauterine manipulator was then removed.  Instrument, sponge, and needle counts were correct prior to abdominal closure and at the conclusion of the case.   Findings: The anterior cul-de-sac contain fine adhesions between the bladder flap and the anterior abdominal wall. The uterus at the fundus contained a single adhesion suspending the anterior uterus from the anterior abdominal wall. The adnexa::  Both ovaries appeared within normal limits without any significant adhesions or stigmata of endometriosis. Cul-de-sac posteriorly contained no evidence of endometriosis or adhesions.  Estimated Blood Loss:  Minimal         Drains: none         Total IV Fluids:         Specimens: posterior cul-de-sac peritoneal biopsy and anterior uterine adhesion            Complications:  None; patient tolerated the procedure well.         Disposition: PACU - hemodynamically stable. I anticipate the patient being discharged home after postoperative period         Condition: stable

## 2012-03-04 NOTE — Anesthesia Procedure Notes (Signed)
Procedure Name: Intubation Date/Time: 03/04/2012 1:45 PM Performed by: Graciela Husbands Pre-anesthesia Checklist: Emergency Drugs available, Timeout performed, Suction available, Patient identified and Patient being monitored Patient Re-evaluated:Patient Re-evaluated prior to inductionOxygen Delivery Method: Circle system utilized Preoxygenation: Pre-oxygenation with 100% oxygen Intubation Type: IV induction Ventilation: Oral airway inserted - appropriate to patient size and Mask ventilation without difficulty Grade View: Grade I Tube type: Oral Tube size: 7.0 mm Number of attempts: 1 Airway Equipment and Method: Stylet and Video-laryngoscopy (Glide Scope Blade 3) Placement Confirmation: ETT inserted through vocal cords under direct vision,  positive ETCO2 and breath sounds checked- equal and bilateral Secured at: 20 cm Tube secured with: Tape Dental Injury: Teeth and Oropharynx as per pre-operative assessment  Difficulty Due To: Difficulty was unanticipated, Difficult Airway- due to anterior larynx and Difficult Airway- due to dentition

## 2012-03-04 NOTE — Transfer of Care (Signed)
Immediate Anesthesia Transfer of Care Note  Patient: [name] Samantha Becker  Procedure(s) Performed: Procedure(s) (LRB): LAPAROSCOPY OPERATIVE (N/A) LYSIS OF ADHESION (N/A)  Patient Location: PACU  Anesthesia Type: General  Level of Consciousness: awake, alert  and oriented  Airway & Oxygen Therapy: Patient Spontanous Breathing and Patient connected to nasal cannula oxygen  Post-op Assessment: Report given to PACU RN and Post -op Vital signs reviewed and stable  Post vital signs: Reviewed and stable  Complications: No apparent anesthesia complications

## 2012-03-05 ENCOUNTER — Other Ambulatory Visit: Payer: Self-pay | Admitting: Family Medicine

## 2012-03-05 ENCOUNTER — Encounter (HOSPITAL_COMMUNITY): Payer: Self-pay | Admitting: Obstetrics and Gynecology

## 2012-03-05 DIAGNOSIS — Z1231 Encounter for screening mammogram for malignant neoplasm of breast: Secondary | ICD-10-CM

## 2012-03-05 NOTE — Anesthesia Postprocedure Evaluation (Signed)
  Anesthesia Post-op Note  Patient: Merchandiser, retail  Procedure(s) Performed: Procedure(s) (LRB): LAPAROSCOPY OPERATIVE (N/A) LYSIS OF ADHESION (N/A)  Patient Location: PACU  Anesthesia Type: General  Level of Consciousness: awake, alert  and oriented  Airway and Oxygen Therapy: Patient Spontanous Breathing  Post-op Pain: mild  Post-op Assessment: Post-op Vital signs reviewed, Patient's Cardiovascular Status Stable, Respiratory Function Stable, Patent Airway, No signs of Nausea or vomiting and Pain level controlled  Post-op Vital Signs: Reviewed and stable  Complications: No apparent anesthesia complications

## 2012-03-18 ENCOUNTER — Encounter: Payer: Self-pay | Admitting: Obstetrics and Gynecology

## 2012-03-18 ENCOUNTER — Ambulatory Visit (INDEPENDENT_AMBULATORY_CARE_PROVIDER_SITE_OTHER): Payer: 59 | Admitting: Obstetrics and Gynecology

## 2012-03-18 VITALS — BP 110/78 | HR 72 | Wt 133.0 lb

## 2012-03-18 DIAGNOSIS — N809 Endometriosis, unspecified: Secondary | ICD-10-CM | POA: Insufficient documentation

## 2012-03-18 NOTE — Progress Notes (Signed)
POST OP VISIT Surgery: laparoscopy  Date: 03/04/2012  SUBJECTIVE:Eating a regular diet without difficulty. Bowel movements are normal.  Pain is controlled without any medications.  Bladder function is returned to normal. Vaginal bleeding: none Vaginal discharge: none.    OBJECTIVE: BP 110/78  Pulse 72  Wt 133 lb (60.328 kg)   Pelvic exam: normal external genitalia, vulva, vagina, cervix, uterus and adnexa.  Specifically there was no tenderness on cervical motion or posterior cervical palpation REPORT OF SURGICAL PATHOLOGY FINAL DIAGNOSIS Diagnosis Cul-de-sac biopsy, posterior, and adhesion - BENIGN SOFT TISSUE WITH EXTENSIVE FIBROSIS AND HEMOSIDERIN DEPOSITION, SEE COMMENT. - NO ATYPIA OR MALIGNANCY PRESENT. Microscopic Comment Although not diagnostic, given the presence of extensive fibrosis and hemosiderin deposition, the morphologic features are suspicious for fibrotic ("burned out") endometriotic implant. (CRR:gt, 03/05/12) [place] RUND DO Pathologist, Electronic Signature  ASSESSMENT:  Probable endometriosis with dyspareunia, now s/p excision  RECOMMENDATION: Options reviewed.  1) Observation only for now       2) Begin therapy with Provera or other progestational agent for endometriosis.       Pt wants #2).  She will F/U ifor aex in 12/2012, but will call before then if her symptoms recur for Provera RX.  If Rx needed, she will F/U 3 mos after starting meds

## 2012-04-14 ENCOUNTER — Inpatient Hospital Stay: Admission: RE | Admit: 2012-04-14 | Payer: 59 | Source: Ambulatory Visit

## 2012-05-05 ENCOUNTER — Ambulatory Visit
Admission: RE | Admit: 2012-05-05 | Discharge: 2012-05-05 | Disposition: A | Discharge status: Home / Self Care | Payer: 59 | Source: Ambulatory Visit | Attending: Family Medicine | Admitting: Family Medicine

## 2012-05-05 DIAGNOSIS — Z1231 Encounter for screening mammogram for malignant neoplasm of breast: Secondary | ICD-10-CM

## 2014-01-12 ENCOUNTER — Other Ambulatory Visit: Payer: Self-pay

## 2014-01-12 DIAGNOSIS — Z1231 Encounter for screening mammogram for malignant neoplasm of breast: Secondary | ICD-10-CM

## 2014-02-02 ENCOUNTER — Ambulatory Visit
Admission: RE | Admit: 2014-02-02 | Discharge: 2014-02-02 | Disposition: A | Discharge status: Home / Self Care | Payer: 59 | Source: Ambulatory Visit

## 2014-02-02 DIAGNOSIS — Z1231 Encounter for screening mammogram for malignant neoplasm of breast: Secondary | ICD-10-CM

## 2014-06-28 ENCOUNTER — Encounter: Payer: Self-pay | Admitting: Obstetrics and Gynecology

## 2015-02-08 ENCOUNTER — Other Ambulatory Visit: Payer: Self-pay | Admitting: Family Medicine

## 2015-02-08 ENCOUNTER — Other Ambulatory Visit: Payer: Self-pay

## 2015-02-08 DIAGNOSIS — Z1231 Encounter for screening mammogram for malignant neoplasm of breast: Secondary | ICD-10-CM

## 2015-02-09 ENCOUNTER — Encounter (INDEPENDENT_AMBULATORY_CARE_PROVIDER_SITE_OTHER): Payer: Self-pay

## 2015-02-09 ENCOUNTER — Ambulatory Visit
Admission: RE | Admit: 2015-02-09 | Discharge: 2015-02-09 | Disposition: A | Discharge status: Home / Self Care | Payer: 59 | Source: Ambulatory Visit

## 2015-02-09 DIAGNOSIS — Z1231 Encounter for screening mammogram for malignant neoplasm of breast: Secondary | ICD-10-CM

## 2015-04-27 ENCOUNTER — Other Ambulatory Visit: Payer: Self-pay | Admitting: Gastroenterology

## 2015-04-29 ENCOUNTER — Other Ambulatory Visit: Payer: Self-pay | Admitting: Gastroenterology

## 2015-04-29 DIAGNOSIS — R1013 Epigastric pain: Secondary | ICD-10-CM

## 2015-05-06 ENCOUNTER — Ambulatory Visit
Admission: RE | Admit: 2015-05-06 | Discharge: 2015-05-06 | Disposition: A | Discharge status: Home / Self Care | Payer: 59 | Source: Ambulatory Visit | Attending: Gastroenterology | Admitting: Gastroenterology

## 2015-05-06 DIAGNOSIS — R1013 Epigastric pain: Secondary | ICD-10-CM

## 2016-02-29 ENCOUNTER — Other Ambulatory Visit: Payer: Self-pay | Admitting: Family Medicine

## 2016-02-29 DIAGNOSIS — Z1231 Encounter for screening mammogram for malignant neoplasm of breast: Secondary | ICD-10-CM

## 2016-03-08 ENCOUNTER — Ambulatory Visit
Admission: RE | Admit: 2016-03-08 | Discharge: 2016-03-08 | Disposition: A | Discharge status: Home / Self Care | Payer: 59 | Source: Ambulatory Visit | Attending: Family Medicine | Admitting: Family Medicine

## 2016-03-08 DIAGNOSIS — Z1231 Encounter for screening mammogram for malignant neoplasm of breast: Secondary | ICD-10-CM

## 2017-02-19 ENCOUNTER — Other Ambulatory Visit: Payer: Self-pay | Admitting: Family Medicine

## 2017-02-19 DIAGNOSIS — Z1231 Encounter for screening mammogram for malignant neoplasm of breast: Secondary | ICD-10-CM

## 2017-03-11 ENCOUNTER — Ambulatory Visit
Admission: RE | Admit: 2017-03-11 | Discharge: 2017-03-11 | Disposition: A | Discharge status: Home / Self Care | Payer: 59 | Source: Ambulatory Visit | Attending: Family Medicine | Admitting: Family Medicine

## 2017-03-11 DIAGNOSIS — Z1231 Encounter for screening mammogram for malignant neoplasm of breast: Secondary | ICD-10-CM

## 2017-03-20 DIAGNOSIS — N952 Postmenopausal atrophic vaginitis: Secondary | ICD-10-CM | POA: Insufficient documentation

## 2017-03-20 DIAGNOSIS — Z124 Encounter for screening for malignant neoplasm of cervix: Secondary | ICD-10-CM | POA: Diagnosis not present

## 2017-03-20 DIAGNOSIS — Z01419 Encounter for gynecological examination (general) (routine) without abnormal findings: Secondary | ICD-10-CM | POA: Diagnosis not present

## 2017-03-21 DIAGNOSIS — M6281 Muscle weakness (generalized): Secondary | ICD-10-CM | POA: Diagnosis not present

## 2017-03-28 DIAGNOSIS — M6281 Muscle weakness (generalized): Secondary | ICD-10-CM | POA: Diagnosis not present

## 2017-03-28 DIAGNOSIS — N816 Rectocele: Secondary | ICD-10-CM | POA: Diagnosis not present

## 2017-04-25 DIAGNOSIS — M6281 Muscle weakness (generalized): Secondary | ICD-10-CM | POA: Diagnosis not present

## 2017-05-02 DIAGNOSIS — M6281 Muscle weakness (generalized): Secondary | ICD-10-CM | POA: Diagnosis not present

## 2017-08-15 DIAGNOSIS — L821 Other seborrheic keratosis: Secondary | ICD-10-CM | POA: Diagnosis not present

## 2017-08-15 DIAGNOSIS — L570 Actinic keratosis: Secondary | ICD-10-CM | POA: Diagnosis not present

## 2017-09-23 DIAGNOSIS — L821 Other seborrheic keratosis: Secondary | ICD-10-CM | POA: Diagnosis not present

## 2017-09-23 DIAGNOSIS — L820 Inflamed seborrheic keratosis: Secondary | ICD-10-CM | POA: Diagnosis not present

## 2017-09-23 DIAGNOSIS — Z808 Family history of malignant neoplasm of other organs or systems: Secondary | ICD-10-CM | POA: Diagnosis not present

## 2017-09-23 DIAGNOSIS — B351 Tinea unguium: Secondary | ICD-10-CM | POA: Diagnosis not present

## 2018-01-16 DIAGNOSIS — L570 Actinic keratosis: Secondary | ICD-10-CM | POA: Diagnosis not present

## 2018-01-27 ENCOUNTER — Other Ambulatory Visit: Payer: Self-pay | Admitting: Family Medicine

## 2018-01-27 DIAGNOSIS — Z1231 Encounter for screening mammogram for malignant neoplasm of breast: Secondary | ICD-10-CM

## 2018-03-01 NOTE — Progress Notes (Signed)
Roshawnda Pecora is a 62 y.o. female is here to [employer].   Patient Care Team: Briscoe Deutscher, DO as PCP - General (Family Medicine)   History of Present Illness:   HPI: Hx of OA. Needs Voltaren for hand pain. Exercises regularly. Eats well.   Health Maintenance Due  Topic Date Due  . PAP SMEAR  01/09/2015  . TETANUS/TDAP  02/10/2017   Depression screen PHQ 2/9 03/03/2018  Decreased Interest 0  Down, Depressed, Hopeless 0  PHQ - 2 Score 0    PMHx, SurgHx, SocialHx, Medications, and Allergies were reviewed in the Visit Navigator and updated as appropriate.   Past Medical History:  Diagnosis Date  . Arthritis   . Blood type, Rh negative   . Chicken pox   . GERD (gastroesophageal reflux disease)   . Leaking of urine   . Low iron   . Mumps      Past Surgical History:  Procedure Laterality Date  . CESAREAN SECTION     4 cs  . LAPAROSCOPY  03/04/2012   Procedure: LAPAROSCOPY OPERATIVE;  Surgeon: Eldred Manges, MD;  Location: [facility];  Service: Gynecology;  Laterality: N/A;  Peritoneum Biopsy  . TUBAL LIGATION Bilateral      Family History  Problem Relation Age of Onset  . Heart disease Father   . Hypertension Father   . Cancer Father   . Stroke Father   . Heart disease Mother   . Hypertension Mother   . Arthritis Mother   . Breast cancer Mother   . Osteoporosis Mother   . Arthritis Sister   . Asthma Sister     Social History   Tobacco Use  . Smoking status: Never Smoker  . Smokeless tobacco: Never Used  Substance Use Topics  . Alcohol use: Yes    Alcohol/week: 2.4 oz    Types: 2 Glasses of wine, 2 Cans of beer per week  . Drug use: No    Current Medications and Allergies:   Current Outpatient Medications:  .  aspirin EC 81 MG tablet, Take 1 tablet (81 mg total) by mouth daily., Disp: 30 tablet, Rfl: 3 .  conjugated estrogens (PREMARIN) vaginal cream, Premarin 0.625 mg/gram vaginal cream  Insert 0.5 applicatorsful twice a week by  vaginal route., Disp: , Rfl:  .  diclofenac sodium (VOLTAREN) 1 % GEL, Apply 2 g topically 4 (four) times daily., Disp: 100 g, Rfl: 2  No Known Allergies Review of Systems:   Pertinent items are noted in the HPI. Otherwise, ROS is negative.  Vitals:   Vitals:   03/03/18 1359  BP: 124/82  Pulse: 65  Temp: 98.3 F (36.8 C)  TempSrc: Oral  SpO2: 96%  Weight: 139 lb 6.4 oz (63.2 kg)  Height: 5\' 2"  (1.575 m)     Body mass index is 25.5 kg/m.  Physical Exam:   Physical Exam  Constitutional: She is oriented to person, place, and time. She appears well-developed and well-nourished. No distress.  HENT:  Head: Normocephalic and atraumatic.  Right Ear: External ear normal.  Left Ear: External ear normal.  Nose: Nose normal.  Mouth/Throat: Oropharynx is clear and moist.  Eyes: Pupils are equal, round, and reactive to light. Conjunctivae and EOM are normal.  Neck: Normal range of motion. Neck supple. No thyromegaly present.  Cardiovascular: Normal rate, regular rhythm, normal heart sounds and intact distal pulses.  Pulmonary/Chest: Effort normal and breath sounds normal.  Abdominal: Soft. Bowel sounds are normal.  Musculoskeletal: Normal  range of motion.  Lymphadenopathy:    She has no cervical adenopathy.  Neurological: She is alert and oriented to person, place, and time.  Skin: Skin is warm and dry. Capillary refill takes less than 2 seconds.  Psychiatric: She has a normal mood and affect. Her behavior is normal.  Nursing note and vitals reviewed.   Assessment and Plan:   Peggy was seen today for establish care.  Diagnoses and all orders for this visit:  Routine physical examination  Anemia, unspecified type -     CBC with Differential/Platelet -     Comprehensive metabolic panel  Screening for lipid disorders -     Lipid panel  Primary osteoarthritis of both hands -     diclofenac sodium (VOLTAREN) 1 % GEL; Apply 2 g topically 4 (four) times daily.  Other  orders -     aspirin EC 81 MG tablet; Take 1 tablet (81 mg total) by mouth daily.    . Reviewed expectations re: course of current medical issues. . Discussed self-management of symptoms. . Outlined signs and symptoms indicating need for more acute intervention. . Patient verbalized understanding and all questions were answered. Marland Kitchen Health Maintenance issues including appropriate healthy diet, exercise, and smoking avoidance were discussed with patient. . See orders for this visit as documented in the electronic medical record. . Patient received an After Visit Summary.  Briscoe Deutscher, DO [place], [facility] 03/05/2018

## 2018-03-03 ENCOUNTER — Ambulatory Visit (INDEPENDENT_AMBULATORY_CARE_PROVIDER_SITE_OTHER): Payer: 59 | Admitting: Family Medicine

## 2018-03-03 ENCOUNTER — Encounter: Payer: Self-pay | Admitting: Family Medicine

## 2018-03-03 VITALS — BP 124/82 | HR 65 | Temp 98.3°F | Ht 62.0 in | Wt 139.4 lb

## 2018-03-03 DIAGNOSIS — M19041 Primary osteoarthritis, right hand: Secondary | ICD-10-CM

## 2018-03-03 DIAGNOSIS — Z0000 Encounter for general adult medical examination without abnormal findings: Secondary | ICD-10-CM

## 2018-03-03 DIAGNOSIS — D649 Anemia, unspecified: Secondary | ICD-10-CM | POA: Diagnosis not present

## 2018-03-03 DIAGNOSIS — Z13220 Encounter for screening for lipoid disorders: Secondary | ICD-10-CM

## 2018-03-03 DIAGNOSIS — M19042 Primary osteoarthritis, left hand: Secondary | ICD-10-CM

## 2018-03-03 LAB — CBC WITH DIFFERENTIAL/PLATELET
Basophils Absolute: 0 10*3/uL (ref 0.0–0.1)
Basophils Relative: 0.5 % (ref 0.0–3.0)
Eosinophils Absolute: 0.1 10*3/uL (ref 0.0–0.7)
Eosinophils Relative: 1 % (ref 0.0–5.0)
HCT: 37.6 % (ref 36.0–46.0)
Hemoglobin: 12.8 g/dL (ref 12.0–15.0)
Lymphocytes Relative: 26.7 % (ref 12.0–46.0)
Lymphs Abs: 2 10*3/uL (ref 0.7–4.0)
MCHC: 34 g/dL (ref 30.0–36.0)
MCV: 86.5 fl (ref 78.0–100.0)
Monocytes Absolute: 0.8 10*3/uL (ref 0.1–1.0)
Monocytes Relative: 10.2 % (ref 3.0–12.0)
Neutro Abs: 4.6 10*3/uL (ref 1.4–7.7)
Neutrophils Relative %: 61.6 % (ref 43.0–77.0)
Platelets: 262 10*3/uL (ref 150.0–400.0)
RBC: 4.34 Mil/uL (ref 3.87–5.11)
RDW: 12.8 % (ref 11.5–15.5)
WBC: 7.4 10*3/uL (ref 4.0–10.5)

## 2018-03-03 LAB — COMPREHENSIVE METABOLIC PANEL
ALT: 17 U/L (ref 0–35)
AST: 17 U/L (ref 0–37)
Albumin: 4.4 g/dL (ref 3.5–5.2)
Alkaline Phosphatase: 60 U/L (ref 39–117)
BUN: 15 mg/dL (ref 6–23)
CO2: 32 mEq/L (ref 19–32)
Calcium: 9.3 mg/dL (ref 8.4–10.5)
Chloride: 96 mEq/L (ref 96–112)
Creatinine, Ser: 0.77 mg/dL (ref 0.40–1.20)
GFR: 81.26 mL/min (ref >60.00)
Glucose, Bld: 81 mg/dL (ref 70–99)
Potassium: 3.7 mEq/L (ref 3.5–5.1)
Sodium: 134 mEq/L — ABNORMAL LOW (ref 135–145)
Total Bilirubin: 0.4 mg/dL (ref 0.2–1.2)
Total Protein: 6.9 g/dL (ref 6.0–8.3)

## 2018-03-03 LAB — LIPID PANEL
Cholesterol: 238 mg/dL — ABNORMAL HIGH (ref 0–200)
HDL: 56.4 mg/dL (ref >39.00)
LDL Cholesterol: 149 mg/dL — ABNORMAL HIGH (ref 0–99)
NonHDL: 181.23
Total CHOL/HDL Ratio: 4
Triglycerides: 159 mg/dL — ABNORMAL HIGH (ref 0.0–149.0)
VLDL: 31.8 mg/dL (ref 0.0–40.0)

## 2018-03-03 MED ORDER — DICLOFENAC SODIUM 1 % TD GEL: 2.0000 g | Freq: Four times a day (QID) | TRANSDERMAL | 2 refills | Status: DC

## 2018-03-03 MED ORDER — ASPIRIN EC 81 MG PO TBEC: 81.0000 mg | DELAYED_RELEASE_TABLET | Freq: Every day | ORAL | 3 refills | Status: DC

## 2018-03-05 ENCOUNTER — Encounter: Payer: Self-pay | Admitting: Family Medicine

## 2018-03-12 ENCOUNTER — Ambulatory Visit
Admission: RE | Admit: 2018-03-12 | Discharge: 2018-03-12 | Disposition: A | Discharge status: Home / Self Care | Payer: 59 | Source: Ambulatory Visit | Attending: Family Medicine | Admitting: Family Medicine

## 2018-03-12 DIAGNOSIS — Z1231 Encounter for screening mammogram for malignant neoplasm of breast: Secondary | ICD-10-CM | POA: Diagnosis not present

## 2018-06-12 ENCOUNTER — Ambulatory Visit (INDEPENDENT_AMBULATORY_CARE_PROVIDER_SITE_OTHER): Payer: 59 | Admitting: Family Medicine

## 2018-06-12 ENCOUNTER — Encounter: Payer: Self-pay | Admitting: Family Medicine

## 2018-06-12 VITALS — BP 120/82 | HR 71 | Temp 97.6°F | Ht 62.0 in | Wt 129.2 lb

## 2018-06-12 DIAGNOSIS — L309 Dermatitis, unspecified: Secondary | ICD-10-CM

## 2018-06-12 MED ORDER — BETAMETHASONE SOD PHOS & ACET 6 (3-3) MG/ML IJ SUSP: 12.0000 mg | Freq: Once | INTRAMUSCULAR | Status: DC

## 2018-06-12 MED ORDER — TRIAMCINOLONE ACETONIDE 0.5 % EX CREA: 1.0000 "application " | TOPICAL_CREAM | Freq: Two times a day (BID) | CUTANEOUS | 0 refills | Status: DC

## 2018-06-12 MED ORDER — METHYLPREDNISOLONE ACETATE 80 MG/ML IJ SUSP
80.0000 mg | Freq: Once | INTRAMUSCULAR | Status: AC
  Administered 2018-06-12: 80 mg via INTRAMUSCULAR

## 2018-06-12 NOTE — Progress Notes (Signed)
Patient: Samantha Becker MRN: [id number] DOB: 28-Jan-1956 PCP: Briscoe Deutscher, DO     Subjective:  Chief Complaint  Patient presents with  . rash on neck    poss allergic rxn to sunscreen?    HPI: The patient is a 62 y.o. female who presents today for rash. Rash started on Sunday afternoon. She was eating lunch outside and put on sunscreen sample from derm. By the time she got to the car, she was breaking out in a rash. It is where she put the sunscreen. She did put the sunscreen on her face and arms and no rash was in this area. Rash has not changed at all since Sunday. She started to use a px strength steroid cream Tuesday night and it doesn't seem to have helped at all. It is very itchy in nature. It looks and feels like sun poisoning, but doesn't hurt at all. She denies any sun burn. No other new products, plant contact, foods, etc... No one else has this in her house. No new otc medication or px medication has been started.   Review of Systems  Constitutional: Negative for fatigue and fever.  Respiratory: Negative for cough and shortness of breath.   Cardiovascular: Negative for chest pain.  Gastrointestinal: Negative for abdominal pain, nausea and vomiting.  Skin: Positive for rash.       Rash on chest, neck and upper back    Allergies Patient has No Known Allergies.  Past Medical History Patient  has a past medical history of Arthritis, Blood type, Rh negative, Chicken pox, GERD (gastroesophageal reflux disease), Leaking of urine, Low iron, and Mumps.  Surgical History Patient  has a past surgical history that includes Cesarean section; laparoscopy (03/04/2012); and Tubal ligation (Bilateral).  Family History Pateint's family history includes Arthritis in her mother and sister; Asthma in her sister; Breast cancer in her mother; Cancer in her father; Heart disease in her father and mother; Hypertension in her father and mother; Osteoporosis in her mother; Stroke in her  father.  Social History Patient  reports that she has never smoked. She has never used smokeless tobacco. She reports that she drinks about 4.0 standard drinks of alcohol per week. She reports that she does not use drugs.    Objective: Vitals:   06/12/18 0939  BP: 120/82  Pulse: 71  Temp: 97.6 F (36.4 C)  TempSrc: Oral  SpO2: 97%  Weight: 129 lb 3.2 oz (58.6 kg)  Height: 5\' 2"  (1.575 m)    Body mass index is 23.63 kg/m.  Physical Exam  Constitutional: She appears well-developed and well-nourished.  Skin: Skin is warm and dry. Rash (erythematous/pink maculopapular rash on anterior chest, neck and posterior neck ) noted.  Vitals reviewed.      Assessment/plan: 1. Dermatitis Appears to be contact dermatitis. Stop using sun screen she used. Will do higher strength of topical steroid and discussed not to use on face and stop after 10 days. Steroid shot today. If not better in 2-3 days please let us know. Side effects of injection discussed.   - methylPREDNISolone acetate (DEPO-MEDROL) injection 80 mg      Return if symptoms worsen or fail to improve.   Orma Flaming, MD [place]   06/12/2018

## 2018-06-13 ENCOUNTER — Ambulatory Visit (INDEPENDENT_AMBULATORY_CARE_PROVIDER_SITE_OTHER): Payer: 59 | Admitting: Surgical

## 2018-06-13 ENCOUNTER — Encounter: Payer: Self-pay | Admitting: Surgical

## 2018-06-13 DIAGNOSIS — Z23 Encounter for immunization: Secondary | ICD-10-CM | POA: Diagnosis not present

## 2018-06-13 NOTE — Progress Notes (Signed)
Patient comes in today for a Tdap and a flu shot. Tdap given in left deltoid and Flu given in right deltoid. Patient tolerated well.

## 2018-08-02 IMAGING — MG 2D DIGITAL SCREENING BILATERAL MAMMOGRAM WITH CAD AND ADJUNCT TO
9 of 12 series · 9 of 28 positions shown · non-contrast
Comparison: Previous exam(s).

CLINICAL DATA: Screening.

EXAM:
2D DIGITAL SCREENING BILATERAL MAMMOGRAM WITH CAD AND ADJUNCT TOMO

[L MLO]
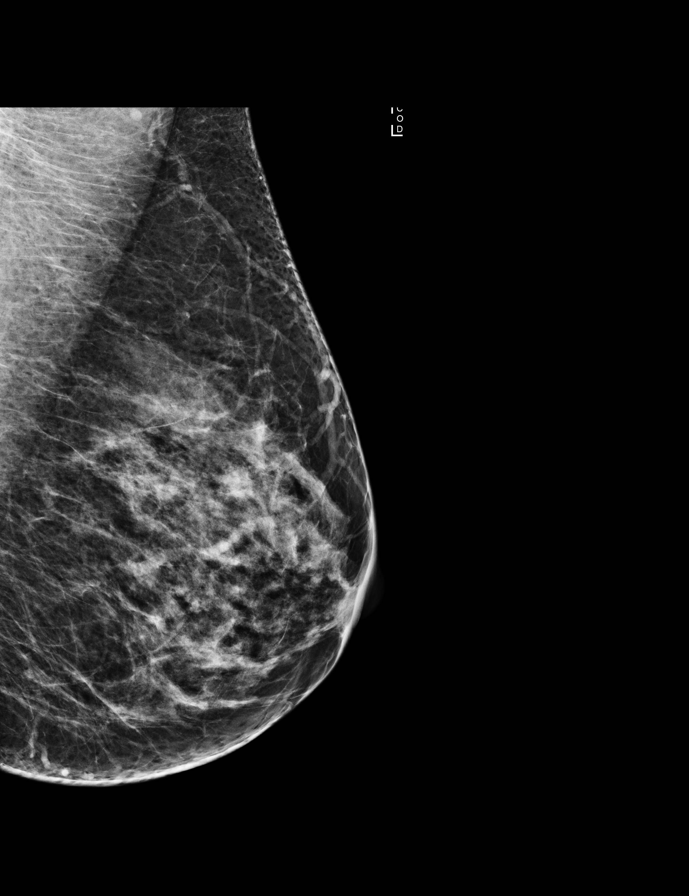

[L CC synth-2D]
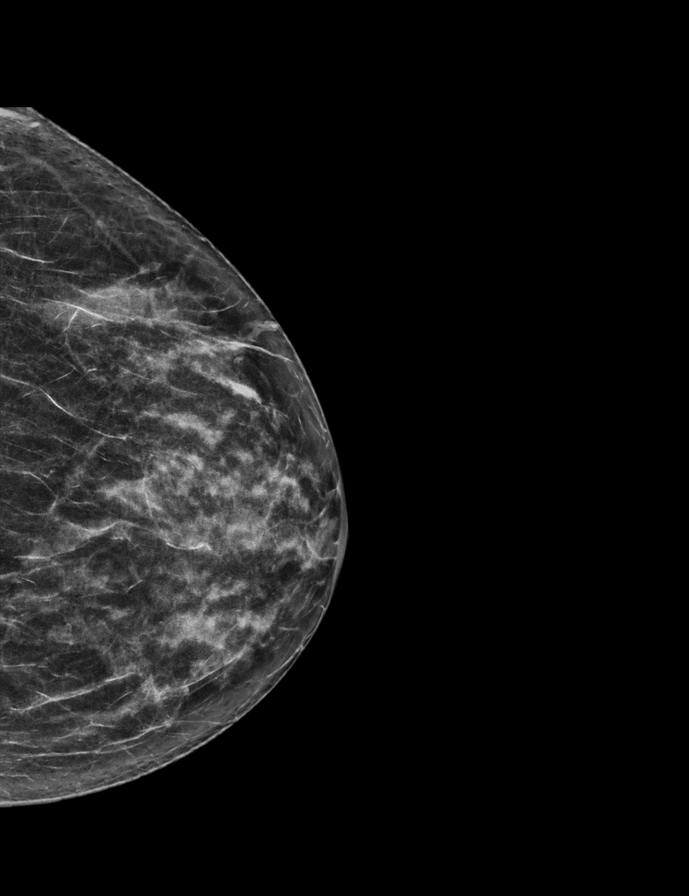

[L CC]
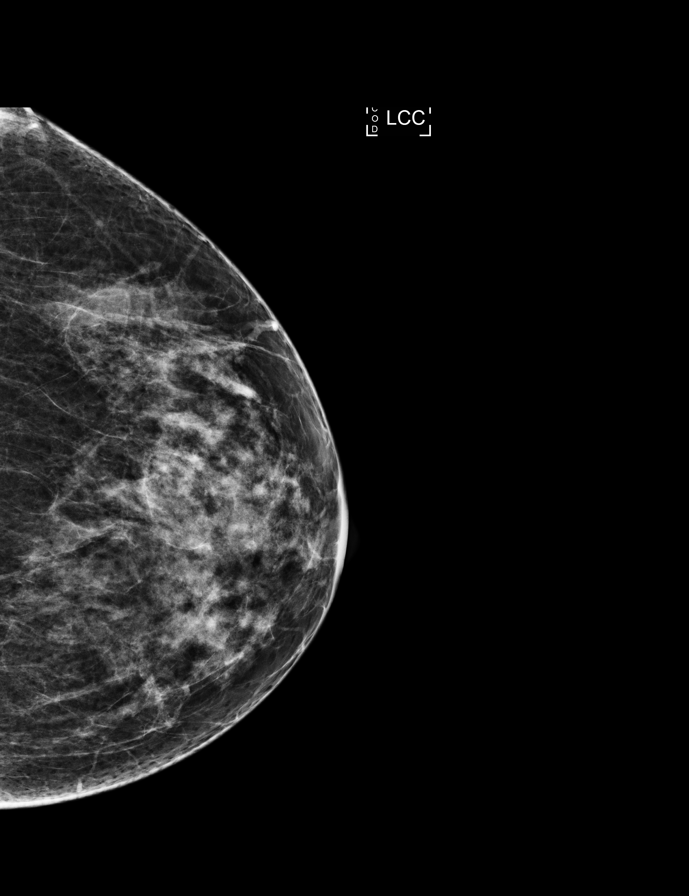

[R CC]
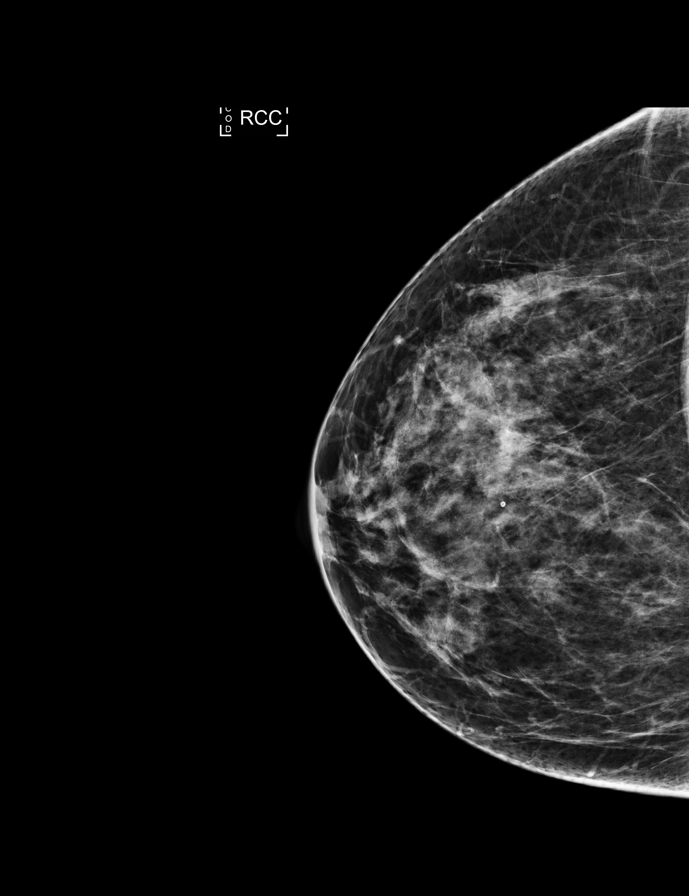

[R MLO]
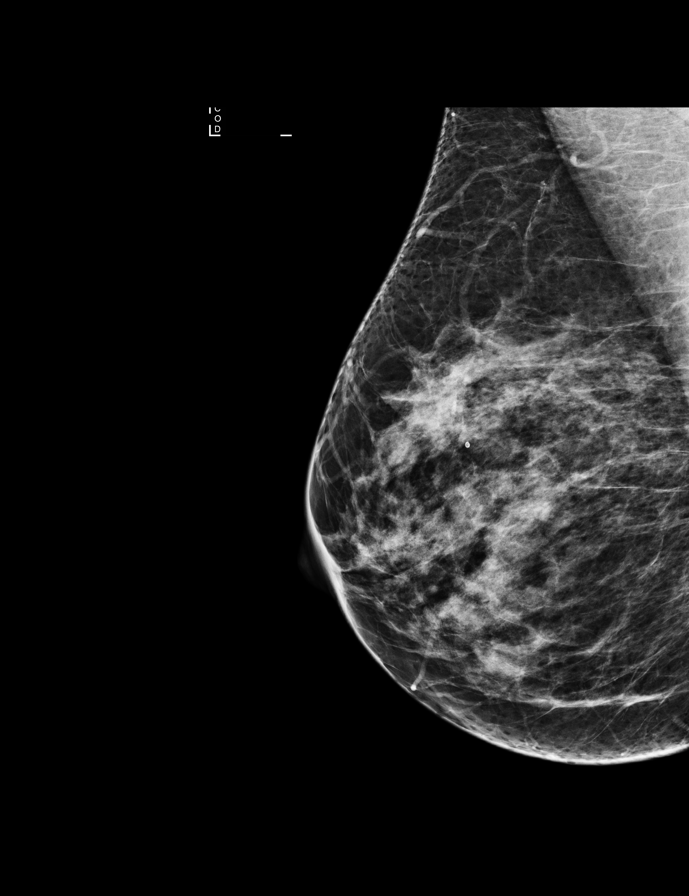

[L MLO synth-2D]
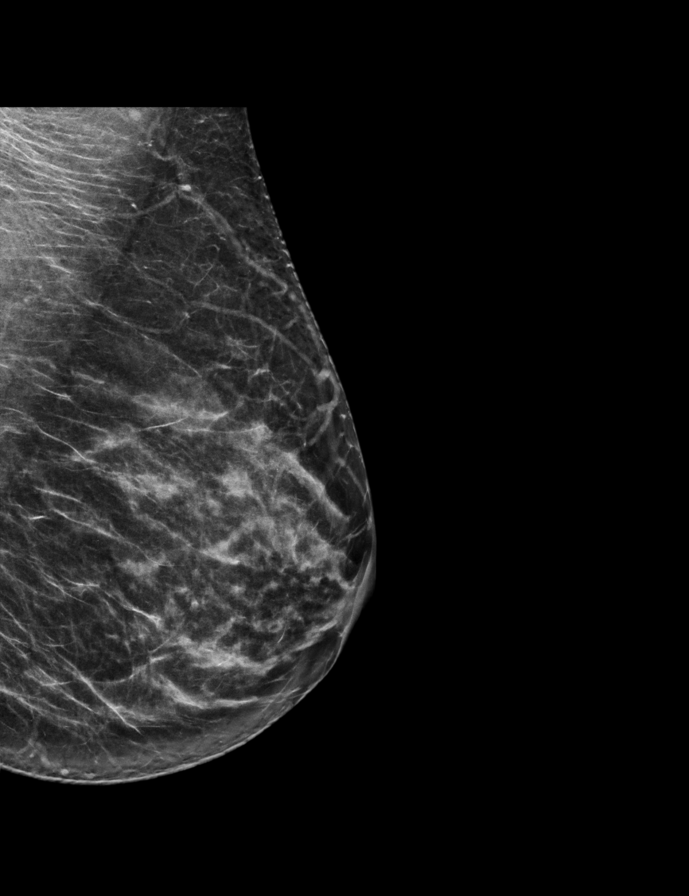

[R CC synth-2D]
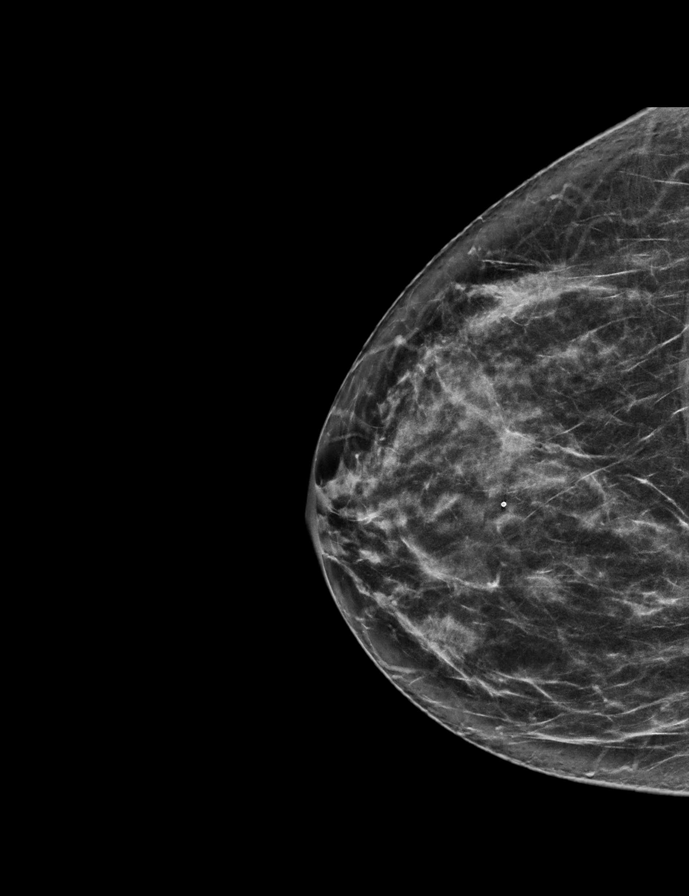

[R MLO synth-2D]
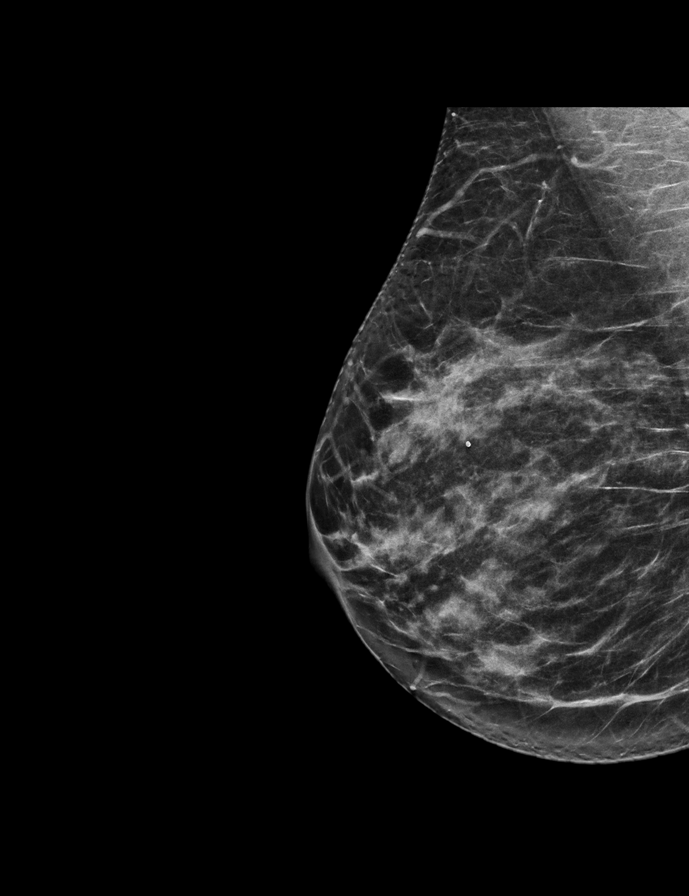

[L CC tomo · tomo slice 29/58.0]
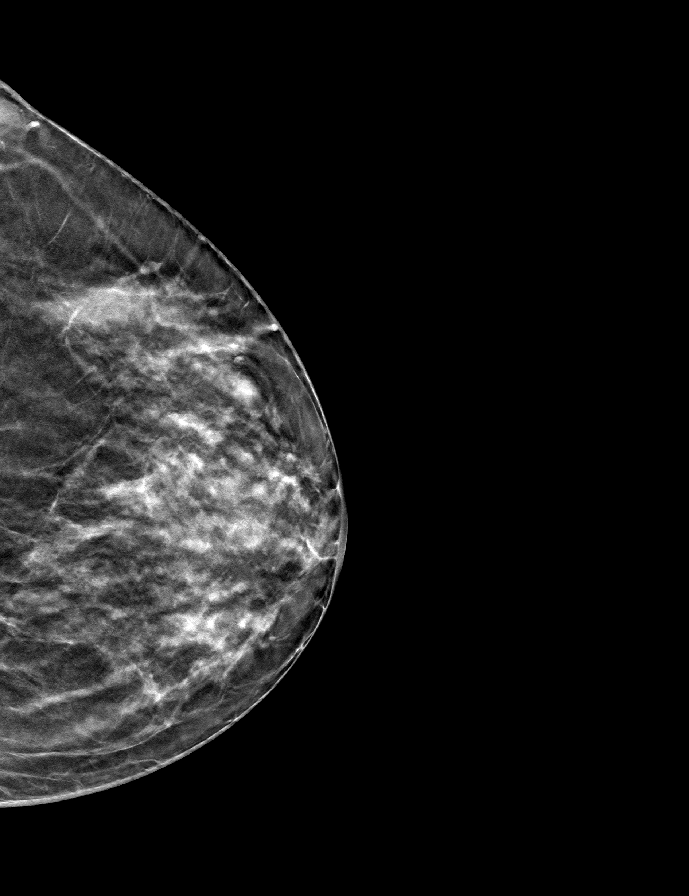

[9 of 28 positions shown; findings below may reference images not displayed]

ACR Breast Density Category c: The breast tissue is heterogeneously
dense, which may obscure small masses.
FINDINGS: There are no findings suspicious for malignancy. Images were
processed with CAD.
IMPRESSION: No mammographic evidence of malignancy. A result letter of this
screening mammogram will be mailed directly to the patient.

RECOMMENDATION:
Screening mammogram in one year. (Code:TN-[id number])

BI-RADS CATEGORY  1: Negative.

## 2019-02-09 ENCOUNTER — Encounter: Payer: Self-pay | Admitting: Family Medicine

## 2019-02-09 ENCOUNTER — Other Ambulatory Visit: Payer: Self-pay

## 2019-02-09 ENCOUNTER — Ambulatory Visit (INDEPENDENT_AMBULATORY_CARE_PROVIDER_SITE_OTHER): Payer: 59 | Admitting: Family Medicine

## 2019-02-09 VITALS — BP 110/78 | HR 67 | Temp 98.4°F | Ht 62.0 in | Wt 128.4 lb

## 2019-02-09 DIAGNOSIS — M76899 Other specified enthesopathies of unspecified lower limb, excluding foot: Secondary | ICD-10-CM | POA: Diagnosis not present

## 2019-02-09 DIAGNOSIS — S76019A Strain of muscle, fascia and tendon of unspecified hip, initial encounter: Secondary | ICD-10-CM

## 2019-02-09 MED ORDER — DICLOFENAC SODIUM 75 MG PO TBEC
75.0000 mg | DELAYED_RELEASE_TABLET | Freq: Two times a day (BID) | ORAL | 0 refills | Status: DC
Start: 2019-02-09 — End: 2019-02-19

## 2019-02-09 NOTE — Patient Instructions (Signed)
It was very nice to see you today!  I think you have strained and inflamed several muscle groups in your hips and pelvis including your hip abductors and hip flexors.  Please work on the exercises.  Please take the diclofenac twice daily for the next 2 weeks.  It is okay for you to gradually return to exercise.  Please stop any activity that makes her symptoms worse.  It is okay for you to ice any areas that are specifically painful.  Please let us know if your symptoms worsen or do not improve in the next couple of weeks.  Take care, Dr Jerline Pain

## 2019-02-09 NOTE — Progress Notes (Signed)
   Chief Complaint:  Samantha Becker is a 63 y.o. female who presents for same day appointment with a chief complaint of groin Becker.   Assessment/Plan:  Groin Becker/hip flexor strain/hip abductor strain No red flags.  She has quite a bit of tenderness and weakness with resisted flexion and abduction at her hips bilaterally.  Will start home exercise program and handout was given.  Will start diclofenac 75 mg twice daily for the next 2 weeks.  Recommended ice as needed.  Discussed activities to avoid.  Discussed reasons to return to care.  Follow-up as needed.     Subjective:  HPI:  Groin Becker, acute problem Started 3 weeks ago.  Located in bilateral hips and groins however mostly in right hip and groin over the past week or so.  No obvious injuries or precipitating events.  She has been running more frequently which she thinks could have contributed.  Left was much more severe however is improved over the past week.  She is taking Advil which helps a little bit.  No weakness or numbness.  No other treatments tried.  No other obvious alleviating or aggravating factors.  No other obvious alleviating or aggravating factors.    ROS: Per HPI  PMH: She reports that she has never smoked. She has never used smokeless tobacco. She reports current alcohol use of about 4.0 standard drinks of alcohol per week. She reports that she does not use drugs.      Objective:  Physical Exam: BP 110/78 (BP Location: Left Arm, Patient Position: Sitting, Cuff Size: Normal)   Pulse 67   Temp 98.4 F (36.9 C) (Oral)   Ht 5\' 2"  (1.575 m)   Wt 128 lb 6.1 oz (58.2 kg)   SpO2 97%   BMI 23.48 kg/m   Gen: NAD, resting comfortably MSK: -Back: No deformities.  Slightly tender to palpation along lower lumbar paraspinal muscles -Right lower extremity: No deformities.  Very weak hip flexor strength secondary to Becker.  She is able to hold her leg up against gravity however is not able to stand any with resistance.   Hip abductor also very weak however able to withstand slight amount of resistance.  Neurovascular intact distally. -Left lower extremity: No deformities.  Week hip flexors but is able to withstand slight resistance.  Hip abductors also weak and able to stand slight amount of assistance.  Neurovascular intact distally.      Samantha Becker. Samantha Pain, MD 02/09/2019 2:45 PM

## 2019-02-19 ENCOUNTER — Other Ambulatory Visit: Payer: Self-pay | Admitting: Family Medicine

## 2019-02-24 ENCOUNTER — Other Ambulatory Visit: Payer: Self-pay | Admitting: Family Medicine

## 2019-02-24 DIAGNOSIS — Z1231 Encounter for screening mammogram for malignant neoplasm of breast: Secondary | ICD-10-CM

## 2019-04-08 ENCOUNTER — Ambulatory Visit: Payer: 59

## 2019-04-16 ENCOUNTER — Other Ambulatory Visit: Payer: Self-pay

## 2019-04-16 ENCOUNTER — Ambulatory Visit
Admission: RE | Admit: 2019-04-16 | Discharge: 2019-04-16 | Disposition: A | Discharge status: Home / Self Care | Payer: 59 | Source: Ambulatory Visit | Attending: Family Medicine | Admitting: Family Medicine

## 2019-04-16 DIAGNOSIS — Z1231 Encounter for screening mammogram for malignant neoplasm of breast: Secondary | ICD-10-CM

## 2019-05-29 ENCOUNTER — Ambulatory Visit: Payer: 59

## 2019-06-10 ENCOUNTER — Encounter: Payer: Self-pay | Admitting: Family Medicine

## 2019-06-10 ENCOUNTER — Ambulatory Visit (INDEPENDENT_AMBULATORY_CARE_PROVIDER_SITE_OTHER): Payer: 59

## 2019-06-10 ENCOUNTER — Other Ambulatory Visit: Payer: Self-pay

## 2019-06-10 DIAGNOSIS — Z23 Encounter for immunization: Secondary | ICD-10-CM | POA: Diagnosis not present

## 2019-08-10 ENCOUNTER — Telehealth: Payer: Self-pay | Admitting: General Practice

## 2019-08-10 NOTE — Telephone Encounter (Signed)
Pt says that she usually get Rx through her GYN but she has retired so pt would like to know if PCP office would prescribe?   Medication : conjugated estrogens (PREMARIN) vaginal cream     Pharmacy:  [id number] - [place], [place] Phone:  [phone]  Fax:  [phone]

## 2019-08-10 NOTE — Telephone Encounter (Signed)
Contact patient to schedule acute visit if needed for medicine and TOC appt

## 2019-08-12 ENCOUNTER — Other Ambulatory Visit: Payer: Self-pay

## 2019-08-13 ENCOUNTER — Ambulatory Visit (INDEPENDENT_AMBULATORY_CARE_PROVIDER_SITE_OTHER): Payer: 59 | Admitting: Physician Assistant

## 2019-08-13 ENCOUNTER — Encounter: Payer: Self-pay | Admitting: Physician Assistant

## 2019-08-13 VITALS — BP 130/80 | HR 69 | Temp 97.5°F | Ht 62.0 in | Wt 129.2 lb

## 2019-08-13 DIAGNOSIS — H6983 Other specified disorders of Eustachian tube, bilateral: Secondary | ICD-10-CM

## 2019-08-13 DIAGNOSIS — M25512 Pain in left shoulder: Secondary | ICD-10-CM

## 2019-08-13 DIAGNOSIS — N952 Postmenopausal atrophic vaginitis: Secondary | ICD-10-CM

## 2019-08-13 MED ORDER — PREMARIN 0.625 MG/GM VA CREA: TOPICAL_CREAM | VAGINAL | 2 refills | Status: DC

## 2019-08-13 NOTE — Progress Notes (Signed)
Samantha Becker is a 63 y.o. female is here for Transfer of care.  I acted as a Education administrator for [employer], PA-C Anselmo Pickler, LPN  History of Present Illness:   Chief Complaint  Patient presents with  . Transfer of care    from Dr. Juleen China  . Rt ear problem    HPI   Pt is transferring care from Dr. Juleen China.  Atrophic vaginitis Pt would like a refill on her Premarin vaginal cream was prescribed by her GYN but he retired. Has been on this medication for the past 4-5 years. Would like to continue.   Ear problem Pt c/o pressure in Rt ear x 2 months, using Flonase with no relief. Decreased hearing. No fever, chills, SOB, cough, runny nose, sinus pressure.  L shoulder pain Has pain to anterior shoulder, specifically with overhead movements. Denies trauma. Notices the pain when she lifts her grandbaby. Denies weakness.  There are no preventive care reminders to display for this patient.  Past Medical History:  Diagnosis Date  . Arthritis   . Blood type, Rh negative   . Chicken pox   . GERD (gastroesophageal reflux disease)   . Leaking of urine   . Low iron   . Mumps      Social History   Socioeconomic History  . Marital status: Married    Spouse name: Not on file  . Number of children: Not on file  . Years of education: Not on file  . Highest education level: Not on file  Occupational History  . Occupation: TEACHER     Employer: [employer]  Tobacco Use  . Smoking status: Never Smoker  . Smokeless tobacco: Never Used  Substance and Sexual Activity  . Alcohol use: Yes    Alcohol/week: 4.0 standard drinks    Types: 2 Glasses of wine, 2 Cans of beer per week  . Drug use: No  . Sexual activity: Yes  Other Topics Concern  . Not on file  Social History Narrative  . Not on file   Social Determinants of Health   Financial Resource Strain:   . Difficulty of Paying Living Expenses: Not on file  Food Insecurity:   . Worried About Charity fundraiser  in the Last Year: Not on file  . Ran Out of Food in the Last Year: Not on file  Transportation Needs:   . Lack of Transportation (Medical): Not on file  . Lack of Transportation (Non-Medical): Not on file  Physical Activity:   . Days of Exercise per Week: Not on file  . Minutes of Exercise per Session: Not on file  Stress:   . Feeling of Stress : Not on file  Social Connections:   . Frequency of Communication with Friends and Family: Not on file  . Frequency of Social Gatherings with Friends and Family: Not on file  . Attends Religious Services: Not on file  . Active Member of Clubs or Organizations: Not on file  . Attends Archivist Meetings: Not on file  . Marital Status: Not on file  Intimate Partner Violence:   . Fear of Current or Ex-Partner: Not on file  . Emotionally Abused: Not on file  . Physically Abused: Not on file  . Sexually Abused: Not on file    Past Surgical History:  Procedure Laterality Date  . CESAREAN SECTION     4 cs  . LAPAROSCOPY  03/04/2012   Procedure: LAPAROSCOPY OPERATIVE;  Surgeon: Eldred Manges, MD;  Location: [facility];  Service: Gynecology;  Laterality: N/A;  Peritoneum Biopsy  . TUBAL LIGATION Bilateral     Family History  Problem Relation Age of Onset  . Heart disease Father   . Hypertension Father   . Cancer Father   . Stroke Father   . Heart disease Mother   . Hypertension Mother   . Arthritis Mother   . Breast cancer Mother   . Osteoporosis Mother   . Arthritis Sister   . Asthma Sister     PMHx, SurgHx, SocialHx, FamHx, Medications, and Allergies were reviewed in the Visit Navigator and updated as appropriate.   Patient Active Problem List   Diagnosis Date Noted  . Atrophic vaginitis 03/20/2017  . Endometriosis 03/18/2012  . Dyspareunia 02/01/2012    Social History   Tobacco Use  . Smoking status: Never Smoker  . Smokeless tobacco: Never Used  Substance Use Topics  . Alcohol use: Yes    Alcohol/week: 4.0  standard drinks    Types: 2 Glasses of wine, 2 Cans of beer per week  . Drug use: No    Current Medications and Allergies:    Current Outpatient Medications:  .  aspirin EC 81 MG tablet, Take 1 tablet (81 mg total) by mouth daily., Disp: 30 tablet, Rfl: 3 .  conjugated estrogens (PREMARIN) vaginal cream, Premarin 0.625 mg/gram vaginal cream  Insert 0.5 applicatorsful twice a week by vaginal route., Disp: 42.5 g, Rfl: 2 .  diclofenac (VOLTAREN) 75 MG EC tablet, TAKE 1 TABLET BY MOUTH TWICE A DAY, Disp: 30 tablet, Rfl: 0 .  terbinafine (LAMISIL) 250 MG tablet, Take 250 mg by mouth daily., Disp: , Rfl:   No Known Allergies  Review of Systems   ROS  Negative unless otherwise specified per HPI.  Vitals:   Vitals:   08/13/19 0851  BP: 130/80  Pulse: 69  Temp: (!) 97.5 F (36.4 C)  TempSrc: Temporal  SpO2: 99%  Weight: 129 lb 4 oz (58.6 kg)  Height: 5\' 2"  (1.575 m)     Body mass index is 23.64 kg/m.   Physical Exam:    Physical Exam Vitals and nursing note reviewed.  Constitutional:      General: She is not in acute distress.    Appearance: She is well-developed. She is not ill-appearing or toxic-appearing.  HENT:     Right Ear: Ear canal and external ear normal. A middle ear effusion is present. Tympanic membrane is not perforated, erythematous or retracted.     Left Ear: Ear canal and external ear normal. A middle ear effusion is present. Tympanic membrane is not perforated, erythematous or retracted.  Cardiovascular:     Rate and Rhythm: Normal rate and regular rhythm.     Pulses: Normal pulses.     Heart sounds: Normal heart sounds, S1 normal and S2 normal.     Comments: No LE edema Pulmonary:     Effort: Pulmonary effort is normal.     Breath sounds: Normal breath sounds.  Musculoskeletal:     Comments: Shoulder exam: No deformity of shoulders on inspection. Pain elicited with forward flexion of L arm.   Skin:    General: Skin is warm and dry.    Neurological:     Mental Status: She is alert.     GCS: GCS eye subscore is 4. GCS verbal subscore is 5. GCS motor subscore is 6.  Psychiatric:        Speech: Speech normal.  Behavior: Behavior normal. Behavior is cooperative.      Assessment and Plan:    Garnette was seen today for transfer of care and rt ear problem.  Diagnoses and all orders for this visit:  Atrophic vaginitis Will refill premarin vaginal cream. Is aware of risks (including but not limited to gyn cancer) with this medication but would like to continue.  Dysfunction of both eustachian tubes No red flags on exam. Will trial pseudoephedrine to see if this can help with her congestion. If fails treatment, will consider oral prednisone.  Acute pain of left shoulder No red flags on discussion. She is not interested in physical therapy or xray at this time. Exercises provided, follow-up if no improvement of symptoms or if any worsening.  Other orders -     conjugated estrogens (PREMARIN) vaginal cream; Premarin 0.625 mg/gram vaginal cream  Insert 0.5 applicatorsful twice a week by vaginal route.   . Reviewed expectations re: course of current medical issues. . Discussed self-management of symptoms. . Outlined signs and symptoms indicating need for more acute intervention. . Patient verbalized understanding and all questions were answered. . See orders for this visit as documented in the electronic medical record. . Patient received an After Visit Summary.  CMA or LPN served as scribe during this visit. History, Physical, and Plan performed by medical provider. The above documentation has been reviewed and is accurate and complete.   Inda Coke, PA-C [place] [place] 08/13/2019  Follow-up: No follow-ups on file.

## 2019-08-13 NOTE — Patient Instructions (Signed)
It was great to see you!  Premarin refill sent  Try the exercises for your shoulder -- if no improvement, let me know and we will get you to sports medicine  Try sudafed (Pseudoephedrine), dosing is dependent on what you purchase: Immediate release: 60 mg every 4 to 6 hours; Extended release: 120 mg every 12 hours or 240 mg every 24 hours; maximum: 240 mg per 24 hours   Let's follow-up at your convenience for a physical, sooner if you have concerns.  Take care,  Inda Coke PA-C

## 2019-08-31 ENCOUNTER — Encounter: Payer: Self-pay | Admitting: Physician Assistant

## 2019-09-01 ENCOUNTER — Other Ambulatory Visit: Payer: Self-pay | Admitting: Physician Assistant

## 2019-09-01 MED ORDER — PREDNISONE 20 MG PO TABS: 40.0000 mg | ORAL_TABLET | Freq: Every day | ORAL | 0 refills | Status: DC

## 2019-09-07 ENCOUNTER — Other Ambulatory Visit: Payer: Self-pay

## 2019-09-08 ENCOUNTER — Ambulatory Visit (INDEPENDENT_AMBULATORY_CARE_PROVIDER_SITE_OTHER): Payer: 59 | Admitting: Physician Assistant

## 2019-09-08 ENCOUNTER — Encounter: Payer: Self-pay | Admitting: Physician Assistant

## 2019-09-08 VITALS — BP 122/80 | HR 74 | Temp 98.8°F | Ht 62.0 in | Wt 127.5 lb

## 2019-09-08 DIAGNOSIS — R0981 Nasal congestion: Secondary | ICD-10-CM | POA: Diagnosis not present

## 2019-09-08 MED ORDER — AMOXICILLIN-POT CLAVULANATE 875-125 MG PO TABS: 1.0000 | ORAL_TABLET | Freq: Two times a day (BID) | ORAL | 0 refills | Status: DC

## 2019-09-08 NOTE — Progress Notes (Signed)
Samantha Becker is a 64 y.o. female here for a follow up of a pre-existing problem.  I acted as a Education administrator for [employer], PA-C Anselmo Pickler, LPN  History of Present Illness:   Chief Complaint  Patient presents with  . Sinus Problem    HPI   Sinus problem Pt c/o sinus congestion x 2 weeks, and still having pressure in ears. Was Tx with prednisone at last visit 12/17 and it did not help at all. She is using Flonase occasionally says it drys out her nose too much. Denies fever or chills, cough. Symptoms are mostly R-sided.  Past Medical History:  Diagnosis Date  . Arthritis   . Blood type, Rh negative   . Chicken pox   . GERD (gastroesophageal reflux disease)   . Leaking of urine   . Low iron   . Mumps      Social History   Socioeconomic History  . Marital status: Married    Spouse name: Not on file  . Number of children: Not on file  . Years of education: Not on file  . Highest education level: Not on file  Occupational History  . Occupation: TEACHER     Employer: [employer]  Tobacco Use  . Smoking status: Never Smoker  . Smokeless tobacco: Never Used  Substance and Sexual Activity  . Alcohol use: Yes    Alcohol/week: 4.0 standard drinks    Types: 2 Glasses of wine, 2 Cans of beer per week  . Drug use: No  . Sexual activity: Yes  Other Topics Concern  . 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 Difficulty of Paying Living Expenses: Not on file  Food Insecurity:   . Worried About Charity fundraiser in the Last Year: Not on file  . Ran Out of Food in the Last Year: Not on file  Transportation Needs:   . Lack of Transportation (Medical): Not on file  . Lack of Transportation (Non-Medical): Not on file  Physical Activity:   . Days of Exercise per Week: Not on file  . Minutes of Exercise per Session: Not on file  Stress:   . Feeling  of Stress : Not on file  Social Connections:   . Frequency of Communication with Friends and Family: Not on file  . Frequency of Social Gatherings with Friends and Family: Not on file  . Attends Religious Services: Not on file  . Active Member of Clubs or Organizations: Not on file  . Attends Archivist Meetings: Not on file  . Marital Status: Not on file  Intimate Partner Violence:   . Fear of Current or Ex-Partner: Not on file  . Emotionally Abused: Not on file  . Physically Abused: Not on file  . Sexually Abused: Not on file    Past Surgical History:  Procedure Laterality Date  . CESAREAN SECTION     4 cs  . LAPAROSCOPY  03/04/2012   Procedure: LAPAROSCOPY OPERATIVE;  Surgeon: Eldred Manges, MD;  Location: [facility];  Service: Gynecology;  Laterality: N/A;  Peritoneum Biopsy  . TUBAL LIGATION Bilateral     Family History  Problem Relation Age of Onset  . Heart disease Father   . Hypertension Father   . Cancer Father   . Stroke Father   . Heart disease Mother   . Hypertension Mother   .  Arthritis Mother   . Breast cancer Mother   . Osteoporosis Mother   . Arthritis Sister   . Asthma Sister     No Known Allergies  Current Medications:   Current Outpatient Medications:  .  aspirin EC 81 MG tablet, Take 1 tablet (81 mg total) by mouth daily., Disp: 30 tablet, Rfl: 3 .  conjugated estrogens (PREMARIN) vaginal cream, Premarin 0.625 mg/gram vaginal cream  Insert 0.5 applicatorsful twice a week by vaginal route., Disp: 42.5 g, Rfl: 2 .  diclofenac Sodium (VOLTAREN) 1 % GEL, Apply 2 g topically as needed., Disp: , Rfl:  .  fluticasone (FLONASE) 50 MCG/ACT nasal spray, Place 1 spray into both nostrils as needed for allergies or rhinitis., Disp: , Rfl:  .  terbinafine (LAMISIL) 250 MG tablet, Take 250 mg by mouth daily., Disp: , Rfl:  .  amoxicillin-clavulanate (AUGMENTIN) 875-125 MG tablet, Take 1 tablet by mouth 2 (two) times daily., Disp: 20 tablet, Rfl: 0    Review of Systems:   ROS Negative unless otherwise specified per HPI.  Vitals:   Vitals:   09/08/19 1013  BP: 122/80  Pulse: 74  Temp: 98.8 F (37.1 C)  TempSrc: Temporal  SpO2: 96%  Weight: 127 lb 8 oz (57.8 kg)  Height: 5\' 2"  (1.575 m)     Body mass index is 23.32 kg/m.  Physical Exam:   Physical Exam Vitals and nursing note reviewed.  Constitutional:      General: She is not in acute distress.    Appearance: She is well-developed. She is not ill-appearing or toxic-appearing.  HENT:     Head: Normocephalic.     Right Ear: Ear canal and external ear normal. A middle ear effusion is present. Tympanic membrane is not injected, scarred or perforated.     Left Ear: Ear canal and external ear normal. Tympanic membrane is not injected, scarred or perforated.     Nose:     Right Sinus: Maxillary sinus tenderness and frontal sinus tenderness present.     Left Sinus: No maxillary sinus tenderness or frontal sinus tenderness.     Mouth/Throat:     Lips: Pink.     Mouth: Mucous membranes are moist.  Cardiovascular:     Rate and Rhythm: Normal rate and regular rhythm.     Pulses: Normal pulses.     Heart sounds: Normal heart sounds, S1 normal and S2 normal.     Comments: No LE edema Pulmonary:     Effort: Pulmonary effort is normal.     Breath sounds: Normal breath sounds.  Skin:    General: Skin is warm and dry.  Neurological:     Mental Status: She is alert.     GCS: GCS eye subscore is 4. GCS verbal subscore is 5. GCS motor subscore is 6.  Psychiatric:        Speech: Speech normal.        Behavior: Behavior normal. Behavior is cooperative.     Assessment and Plan:   Candid was seen today for sinus problem.  Diagnoses and all orders for this visit:  Sinus congestion  Other orders -     amoxicillin-clavulanate (AUGMENTIN) 875-125 MG tablet; Take 1 tablet by mouth 2 (two) times daily.   Concern for sinus infection. Given duration of symptoms, will  treat with oral augmentin per orders. Worsening precautions advised. No red flags on exam.  . Reviewed expectations re: course of current medical issues. . Discussed self-management of symptoms. . Outlined  signs and symptoms indicating need for more acute intervention. . Patient verbalized understanding and all questions were answered. . See orders for this visit as documented in the electronic medical record. . Patient received an After-Visit Summary.  CMA or LPN served as scribe during this visit. History, Physical, and Plan performed by medical provider. The above documentation has been reviewed and is accurate and complete.   Inda Coke, PA-C

## 2019-09-08 NOTE — Patient Instructions (Addendum)
It was great to see you!  Use medication as prescribed: augmentin Push fluids and get plenty of rest. Please call us if you are not improving as expected, or if you have high fevers (>101.5) or difficulty swallowing or worsening productive cough.  Call clinic with questions.  I hope you start feeling better soon!

## 2019-09-09 ENCOUNTER — Encounter: Payer: 59 | Admitting: Physician Assistant

## 2019-10-08 ENCOUNTER — Ambulatory Visit: Payer: 59 | Attending: Internal Medicine

## 2019-10-08 DIAGNOSIS — Z20822 Contact with and (suspected) exposure to covid-19: Secondary | ICD-10-CM

## 2019-10-09 LAB — NOVEL CORONAVIRUS, NAA: SARS-CoV-2, NAA: NOT DETECTED

## 2020-03-03 ENCOUNTER — Other Ambulatory Visit: Payer: Self-pay | Admitting: Physician Assistant

## 2020-03-03 DIAGNOSIS — Z1231 Encounter for screening mammogram for malignant neoplasm of breast: Secondary | ICD-10-CM

## 2020-04-18 ENCOUNTER — Ambulatory Visit
Admission: RE | Admit: 2020-04-18 | Discharge: 2020-04-18 | Disposition: A | Discharge status: Home / Self Care | Payer: 59 | Source: Ambulatory Visit | Attending: Physician Assistant | Admitting: Physician Assistant

## 2020-04-18 ENCOUNTER — Other Ambulatory Visit: Payer: Self-pay

## 2020-04-18 DIAGNOSIS — Z1231 Encounter for screening mammogram for malignant neoplasm of breast: Secondary | ICD-10-CM

## 2020-08-18 DIAGNOSIS — H8101 Meniere's disease, right ear: Secondary | ICD-10-CM | POA: Insufficient documentation

## 2020-08-18 DIAGNOSIS — H905 Unspecified sensorineural hearing loss: Secondary | ICD-10-CM | POA: Insufficient documentation

## 2021-02-01 ENCOUNTER — Other Ambulatory Visit: Payer: Self-pay | Admitting: Physician Assistant

## 2021-02-01 DIAGNOSIS — Z1231 Encounter for screening mammogram for malignant neoplasm of breast: Secondary | ICD-10-CM

## 2021-03-07 ENCOUNTER — Encounter: Payer: Self-pay | Admitting: Physician Assistant

## 2021-04-21 ENCOUNTER — Other Ambulatory Visit: Payer: Self-pay

## 2021-04-21 ENCOUNTER — Ambulatory Visit
Admission: RE | Admit: 2021-04-21 | Discharge: 2021-04-21 | Disposition: A | Discharge status: Home / Self Care | Payer: 59 | Source: Ambulatory Visit | Attending: Physician Assistant | Admitting: Physician Assistant

## 2021-04-21 DIAGNOSIS — Z1231 Encounter for screening mammogram for malignant neoplasm of breast: Secondary | ICD-10-CM

## 2021-04-25 ENCOUNTER — Other Ambulatory Visit: Payer: Self-pay

## 2021-04-25 ENCOUNTER — Ambulatory Visit (INDEPENDENT_AMBULATORY_CARE_PROVIDER_SITE_OTHER): Payer: 59 | Admitting: Physician Assistant

## 2021-04-25 ENCOUNTER — Encounter: Payer: Self-pay | Admitting: Physician Assistant

## 2021-04-25 VITALS — BP 120/80 | HR 73 | Temp 98.3°F | Ht 62.0 in | Wt 130.0 lb

## 2021-04-25 DIAGNOSIS — Z0001 Encounter for general adult medical examination with abnormal findings: Secondary | ICD-10-CM

## 2021-04-25 DIAGNOSIS — N952 Postmenopausal atrophic vaginitis: Secondary | ICD-10-CM

## 2021-04-25 DIAGNOSIS — Z13220 Encounter for screening for lipoid disorders: Secondary | ICD-10-CM

## 2021-04-25 DIAGNOSIS — Z136 Encounter for screening for cardiovascular disorders: Secondary | ICD-10-CM

## 2021-04-25 DIAGNOSIS — R200 Anesthesia of skin: Secondary | ICD-10-CM

## 2021-04-25 DIAGNOSIS — M47818 Spondylosis without myelopathy or radiculopathy, sacral and sacrococcygeal region: Secondary | ICD-10-CM | POA: Diagnosis not present

## 2021-04-25 DIAGNOSIS — R202 Paresthesia of skin: Secondary | ICD-10-CM

## 2021-04-25 LAB — COMPREHENSIVE METABOLIC PANEL
ALT: 17 U/L (ref 0–35)
AST: 19 U/L (ref 0–37)
Albumin: 4.5 g/dL (ref 3.5–5.2)
Alkaline Phosphatase: 66 U/L (ref 39–117)
BUN: 14 mg/dL (ref 6–23)
CO2: 27 mEq/L (ref 19–32)
Calcium: 9.3 mg/dL (ref 8.4–10.5)
Chloride: 99 mEq/L (ref 96–112)
Creatinine, Ser: 0.86 mg/dL (ref 0.40–1.20)
GFR: 72.01 mL/min (ref >60.00)
Glucose, Bld: 101 mg/dL — ABNORMAL HIGH (ref 70–99)
Potassium: 3.9 mEq/L (ref 3.5–5.1)
Sodium: 134 mEq/L — ABNORMAL LOW (ref 135–145)
Total Bilirubin: 0.4 mg/dL (ref 0.2–1.2)
Total Protein: 7.3 g/dL (ref 6.0–8.3)

## 2021-04-25 LAB — CBC WITH DIFFERENTIAL/PLATELET
Basophils Absolute: 0.1 10*3/uL (ref 0.0–0.1)
Basophils Relative: 0.7 % (ref 0.0–3.0)
Eosinophils Absolute: 0.2 10*3/uL (ref 0.0–0.7)
Eosinophils Relative: 2.4 % (ref 0.0–5.0)
HCT: 38 % (ref 36.0–46.0)
Hemoglobin: 13.1 g/dL (ref 12.0–15.0)
Lymphocytes Relative: 27.2 % (ref 12.0–46.0)
Lymphs Abs: 1.9 10*3/uL (ref 0.7–4.0)
MCHC: 34.6 g/dL (ref 30.0–36.0)
MCV: 85.3 fl (ref 78.0–100.0)
Monocytes Absolute: 0.6 10*3/uL (ref 0.1–1.0)
Monocytes Relative: 9.4 % (ref 3.0–12.0)
Neutro Abs: 4.1 10*3/uL (ref 1.4–7.7)
Neutrophils Relative %: 60.3 % (ref 43.0–77.0)
Platelets: 273 10*3/uL (ref 150.0–400.0)
RBC: 4.45 Mil/uL (ref 3.87–5.11)
RDW: 13.3 % (ref 11.5–15.5)
WBC: 6.9 10*3/uL (ref 4.0–10.5)

## 2021-04-25 LAB — LIPID PANEL
Cholesterol: 195 mg/dL (ref 0–200)
HDL: 46.5 mg/dL (ref >39.00)
LDL Cholesterol: 111 mg/dL — ABNORMAL HIGH (ref 0–99)
NonHDL: 148.48
Total CHOL/HDL Ratio: 4
Triglycerides: 186 mg/dL — ABNORMAL HIGH (ref 0.0–149.0)
VLDL: 37.2 mg/dL (ref 0.0–40.0)

## 2021-04-25 MED ORDER — PREMARIN 0.625 MG/GM VA CREA: TOPICAL_CREAM | VAGINAL | 2 refills | Status: DC

## 2021-04-25 NOTE — Progress Notes (Signed)
Subjective:    Samantha Becker is a 65 y.o. female and is here for a comprehensive physical exam.   HPI   Health Maintenance Due  Topic Date Due   INFLUENZA VACCINE  03/27/2021    Acute Concerns: Right Arm Numbness/Tingling She is having  tinging and numbness in her right hand and fingers for 1 month. Her symptoms worsen with driving and sitting for lengthy periods of time. She sleeps on both her side at night. Denies any neck pain.   Right lower back pain:  She has some right side lower back pain that has gradual worsened this year. She has seen a chiropractor in the past and has taken NSAIDs to relief her pain. In addition, she completes stretches and has seen a physical therapist. Tolerated physical therapy instructed regiment well. However, she her self treatments have not helped so she would like to be referred to another one for further evaluation   Chronic Issues: Vaginal Dryness:  Permarin 0.625 mg for her vaginal dryness. Tolerating well without adverse affects. Denies any pelvic pain or vaginal pain, denies unusual vaginal bleeding  Health Maintenance: Immunizations -- Will hold off on Shingrix due to insurance but otherwise willing  Colonoscopy -- counseled, she reports was done about 6 years ago -- we do not have record of this Mammogram -- UTD last completed on 04/21/21 PAP --Last completed on 01/09/12. No longer want PAP smear exam Diet -- Drinks alcohol occasionally and reasonably healthy diet  Caffeine intake -- 1-2 cups of coffee per day Sleep habits -- 6-8 hours per night without any complaints  Exercise -- Yoga, walking, and body pump(x 1  per week)  Weight -- Stable Mood -- No apparent issues or mood changes since her last visit  Weight history: Wt Readings from Last 10 Encounters:  04/25/21 130 lb (59 kg)  09/08/19 127 lb 8 oz (57.8 kg)  08/13/19 129 lb 4 oz (58.6 kg)  02/09/19 128 lb 6.1 oz (58.2 kg)  06/12/18 129 lb 3.2 oz (58.6 kg)   03/03/18 139 lb 6.4 oz (63.2 kg)  03/18/12 133 lb (60.3 kg)  03/03/12 134 lb (60.8 kg)  02/26/12 134 lb (60.8 kg)  02/01/12 132 lb (59.9 kg)   Body mass index is 23.78 kg/m. No LMP recorded (lmp unknown). Patient is postmenopausal. Alcohol use:  reports current alcohol use of about 4.0 standard drinks per week. Tobacco use:  Tobacco Use: Low Risk    Smoking Tobacco Use: Never   Smokeless Tobacco Use: Never     Depression screen PHQ 2/9 04/25/2021  Decreased Interest 0  Down, Depressed, Hopeless 0  PHQ - 2 Score 0     Other providers/specialists: Patient Care Team: Inda Coke, [place] as PCP - General (Physician Assistant)    PMHx, SurgHx, SocialHx, Medications, and Allergies were reviewed in the Visit Navigator and updated as appropriate.   Past Medical History:  Diagnosis Date   Arthritis    Blood type, Rh negative    Chicken pox    GERD (gastroesophageal reflux disease)    Leaking of urine    Low iron    Mumps      Past Surgical History:  Procedure Laterality Date   CESAREAN SECTION     4 cs   LAPAROSCOPY  03/04/2012   Procedure: LAPAROSCOPY OPERATIVE;  Surgeon: Eldred Manges, MD;  Location: [facility];  Service: Gynecology;  Laterality: N/A;  Peritoneum Biopsy   TUBAL LIGATION Bilateral  Family History  Problem Relation Age of Onset   Heart disease Father    Hypertension Father    Cancer Father    Stroke Father    Heart disease Mother    Hypertension Mother    Arthritis Mother    Breast cancer Mother    Osteoporosis Mother    Arthritis Sister    Asthma Sister     Social History   Tobacco Use   Smoking status: Never   Smokeless tobacco: Never  Substance Use Topics   Alcohol use: Yes    Alcohol/week: 4.0 standard drinks    Types: 2 Glasses of wine, 2 Cans of beer per week   Drug use: No    Review of Systems:   Review of Systems  Constitutional:  Negative for chills, fever, malaise/fatigue and weight loss.  HENT:  Negative for  hearing loss, sinus pain and sore throat.   Respiratory:  Negative for cough and hemoptysis.   Cardiovascular:  Negative for chest pain, palpitations, leg swelling and PND.  Gastrointestinal:  Negative for abdominal pain, constipation, diarrhea, heartburn, nausea and vomiting.  Genitourinary:  Negative for dysuria, frequency and urgency.  Musculoskeletal:  Negative for back pain, myalgias and neck pain.  Skin:  Negative for itching and rash.  Neurological:  Negative for dizziness, tingling, seizures and headaches.  Endo/Heme/Allergies:  Negative for polydipsia.  Psychiatric/Behavioral:  Negative for depression. The patient is not nervous/anxious.    Objective:   BP 120/80 (BP Location: Left Arm, Patient Position: Sitting, Cuff Size: Normal)   Pulse 73   Temp 98.3 F (36.8 C) (Temporal)   Ht '5\' 2"'$  (1.575 m)   Wt 130 lb (59 kg)   LMP  (LMP Unknown)   SpO2 97%   BMI 23.78 kg/m  Body mass index is 23.78 kg/m.   General Appearance:    Alert, cooperative, no distress, appears stated age  Head:    Normocephalic, without obvious abnormality, atraumatic  Eyes:    PERRL, conjunctiva/corneas clear, EOM's intact, fundi    benign, both eyes  Ears:    Normal TM's and external ear canals, both ears  Nose:   Nares normal, septum midline, mucosa normal, no drainage    or sinus tenderness  Throat:   Lips, mucosa, and tongue normal; teeth and gums normal  Neck:   Supple, symmetrical, trachea midline, no adenopathy;    thyroid:  no enlargement/tenderness/nodules; no carotid   bruit or JVD  Back:     Symmetric, no curvature, ROM normal, no CVA tenderness  Lungs:     Clear to auscultation bilaterally, respirations unlabored  Chest Wall:    No tenderness or deformity   Heart:    Regular rate and rhythm, S1 and S2 normal, no murmur, rub or gallop  Breast Exam:    No tenderness, masses, or nipple abnormality  Abdomen:     Soft, non-tender, bowel sounds active all four quadrants,    no masses, no  organomegaly  Genitalia:    Deferred  Extremities:   Extremities normal, atraumatic, no cyanosis or edema  Pulses:   2+ and symmetric all extremities  Skin:   Skin color, texture, turgor normal, no rashes or lesions  Lymph nodes:   Cervical, supraclavicular, and axillary nodes normal  Neurologic:   CNII-XII intact, normal strength, sensation and reflexes    throughout    Assessment/Plan:   There are no diagnoses linked to this encounter.   1. Encounter for general adult medical examination with abnormal findings  Today patient counseled on age appropriate routine health concerns for screening and prevention, each reviewed and up to date or declined. Immunizations reviewed and up to date or declined. Labs ordered and reviewed. Risk factors for depression reviewed and negative. Hearing function and visual acuity are intact. ADLs screened and addressed as needed. Functional ability and level of safety reviewed and appropriate. Education, counseling and referrals performed based on assessed risks today. Patient provided with a copy of personalized plan for preventive services.   2. Atrophic vaginitis Continue premarin  3. SI joint arthritis Referral to PT  4. Numbness and tingling in right hand Suspect possible ulnar compression Strategies reviewed, handout provided If no improvement, follow-up  5. Encounter for lipid screening for cardiovascular disease Update blood work and provide recommendaitons accordingly    Patient Counseling: '[x]'$    Nutrition: Stressed importance of moderation in sodium/caffeine intake, saturated fat and cholesterol, caloric balance, sufficient intake of fresh fruits, vegetables, fiber, calcium, iron, and 1 mg of folate supplement per day (for females capable of pregnancy).  '[x]'$    Stressed the importance of regular exercise.   '[x]'$    Substance Abuse: Discussed cessation/primary prevention of tobacco, alcohol, or other drug use; driving or other dangerous  activities under the influence; availability of treatment for abuse.   '[x]'$    Injury prevention: Discussed safety belts, safety helmets, smoke detector, smoking near bedding or upholstery.   '[x]'$    Sexuality: Discussed sexually transmitted diseases, partner selection, use of condoms, avoidance of unintended pregnancy  and contraceptive alternatives.  '[x]'$    Dental health: Discussed importance of regular tooth brushing, flossing, and dental visits.  '[x]'$    Health maintenance and immunizations reviewed. Please refer to Health maintenance section.    I,Alexis Bryant,acting as a Education administrator for [employer], PA.,have documented all relevant documentation on the behalf of Inda Coke, PA,as directed by  Inda Coke, PA while in the presence of Inda Coke, [place].  I, Inda Coke, [place], have reviewed all documentation for this visit. The documentation on 04/25/21 for the exam, diagnosis, procedures, and orders are all accurate and complete.  Inda Coke, PA-C [facility]

## 2021-04-25 NOTE — Progress Notes (Deleted)
SCRIBE STATEMENT   Subjective:    Samantha Becker is a 65 y.o. female and is here for a comprehensive physical exam.   HPI  Health Maintenance Due  Topic Date Due   COVID-19 Vaccine (1) Never done   Zoster Vaccines- Shingrix (1 of 2) Never done   PAP SMEAR-Modifier  01/09/2015   INFLUENZA VACCINE  03/27/2021    Acute Concerns:   Chronic Issues:   Health Maintenance: Immunizations -- *** Colonoscopy -- *** Mammogram -- *** PAP -- *** Bone Density -- *** Diet -- *** Caffeine intake -- *** Sleep habits -- *** Exercise -- *** Weight --    Mood -- *** Weight history: Wt Readings from Last 10 Encounters:  09/08/19 127 lb 8 oz (57.8 kg)  08/13/19 129 lb 4 oz (58.6 kg)  02/09/19 128 lb 6.1 oz (58.2 kg)  06/12/18 129 lb 3.2 oz (58.6 kg)  03/03/18 139 lb 6.4 oz (63.2 kg)  03/18/12 133 lb (60.3 kg)  03/03/12 134 lb (60.8 kg)  02/26/12 134 lb (60.8 kg)  02/01/12 132 lb (59.9 kg)  01/09/12 132 lb (59.9 kg)   There is no height or weight on file to calculate BMI. No LMP recorded (lmp unknown). Patient has had a hysterectomy. Alcohol use:  reports current alcohol use of about 4.0 standard drinks per week. Tobacco use:  Tobacco Use: Low Risk    Smoking Tobacco Use: Never   Smokeless Tobacco Use: Never     Depression screen PHQ 2/9 08/13/2019  Decreased Interest 0  Down, Depressed, Hopeless 0  PHQ - 2 Score 0     Other providers/specialists: Patient Care Team: Inda Coke, [place] as PCP - General (Physician Assistant)    PMHx, SurgHx, SocialHx, Medications, and Allergies were reviewed in the Visit Navigator and updated as appropriate.   Past Medical History:  Diagnosis Date   Arthritis    Blood type, Rh negative    Chicken pox    GERD (gastroesophageal reflux disease)    Leaking of urine    Low iron    Mumps      Past Surgical History:  Procedure Laterality Date   CESAREAN SECTION     4 cs   LAPAROSCOPY  03/04/2012   Procedure:  LAPAROSCOPY OPERATIVE;  Surgeon: Eldred Manges, MD;  Location: [facility];  Service: Gynecology;  Laterality: N/A;  Peritoneum Biopsy   TUBAL LIGATION Bilateral      Family History  Problem Relation Age of Onset   Heart disease Father    Hypertension Father    Cancer Father    Stroke Father    Heart disease Mother    Hypertension Mother    Arthritis Mother    Breast cancer Mother    Osteoporosis Mother    Arthritis Sister    Asthma Sister     Social History   Tobacco Use   Smoking status: Never   Smokeless tobacco: Never  Substance Use Topics   Alcohol use: Yes    Alcohol/week: 4.0 standard drinks    Types: 2 Glasses of wine, 2 Cans of beer per week   Drug use: No    Review of Systems:   ROS  Objective:   LMP  (LMP Unknown)  There is no height or weight on file to calculate BMI.   General Appearance:    Alert, cooperative, no distress, appears stated age  Head:    Normocephalic, without obvious abnormality, atraumatic  Eyes:    PERRL, conjunctiva/corneas clear,  EOM's intact, fundi    benign, both eyes  Ears:    Normal TM's and external ear canals, both ears  Nose:   Nares normal, septum midline, mucosa normal, no drainage    or sinus tenderness  Throat:   Lips, mucosa, and tongue normal; teeth and gums normal  Neck:   Supple, symmetrical, trachea midline, no adenopathy;    thyroid:  no enlargement/tenderness/nodules; no carotid   bruit or JVD  Back:     Symmetric, no curvature, ROM normal, no CVA tenderness  Lungs:     Clear to auscultation bilaterally, respirations unlabored  Chest Wall:    No tenderness or deformity   Heart:    Regular rate and rhythm, S1 and S2 normal, no murmur, rub or gallop  Breast Exam:    No tenderness, masses, or nipple abnormality  Abdomen:     Soft, non-tender, bowel sounds active all four quadrants,    no masses, no organomegaly  Genitalia:    Normal female without lesion, discharge or tenderness  Extremities:   Extremities  normal, atraumatic, no cyanosis or edema  Pulses:   2+ and symmetric all extremities  Skin:   Skin color, texture, turgor normal, no rashes or lesions  Lymph nodes:   Cervical, supraclavicular, and axillary nodes normal  Neurologic:   CNII-XII intact, normal strength, sensation and reflexes    throughout    Assessment/Plan:   There are no diagnoses linked to this encounter.    Patient Counseling: '[x]'$    Nutrition: Stressed importance of moderation in sodium/caffeine intake, saturated fat and cholesterol, caloric balance, sufficient intake of fresh fruits, vegetables, fiber, calcium, iron, and 1 mg of folate supplement per day (for females capable of pregnancy).  '[x]'$    Stressed the importance of regular exercise.   '[x]'$    Substance Abuse: Discussed cessation/primary prevention of tobacco, alcohol, or other drug use; driving or other dangerous activities under the influence; availability of treatment for abuse.   '[x]'$    Injury prevention: Discussed safety belts, safety helmets, smoke detector, smoking near bedding or upholstery.   '[x]'$    Sexuality: Discussed sexually transmitted diseases, partner selection, use of condoms, avoidance of unintended pregnancy  and contraceptive alternatives.  '[x]'$    Dental health: Discussed importance of regular tooth brushing, flossing, and dental visits.  '[x]'$    Health maintenance and immunizations reviewed. Please refer to Health maintenance section.   ***  Inda Coke, PA-C [place]

## 2021-04-25 NOTE — Patient Instructions (Signed)
It was great to see you!  Schedule an appointment with Lyndee Hensen for PT after your visit.  For your tingling: ?For patients with ulnar neuropathy at the elbow, the following measures may be helpful:  Patients should avoid leaning on the elbows when seated or driving and should avoid prolonged elbow bending. Practical suggestions include using the other hand or a headset when on the telephone and avoiding sitting with the arms crossed.  The use of a soft foam elbow pad may reduce inadvertent compression of the ulnar nerve at the elbow. To prevent excessive or prolonged elbow flexion, splints that limit flexion to 45 to 90 degrees can be used at night, Patients may wrap the affected elbow with a towel at night to limit flexion, since a towel wrap is usually better tolerated than more rigid braces.   If no improvement let me know  We will update blood work today  If a referral was placed today, you will be contacted for an appointment. Please note that routine referrals can sometimes take up to 3-4 weeks to process. Please call our office if you haven't heard anything after this time frame.  Take care,  Inda Coke PA-C

## 2021-05-08 ENCOUNTER — Ambulatory Visit: Payer: 59 | Admitting: Physical Therapy

## 2021-05-19 ENCOUNTER — Ambulatory Visit: Payer: 59 | Admitting: Physical Therapy

## 2021-05-24 ENCOUNTER — Ambulatory Visit (INDEPENDENT_AMBULATORY_CARE_PROVIDER_SITE_OTHER): Payer: 59 | Admitting: Physical Therapy

## 2021-05-24 ENCOUNTER — Encounter: Payer: Self-pay | Admitting: Physical Therapy

## 2021-05-24 ENCOUNTER — Other Ambulatory Visit: Payer: Self-pay

## 2021-05-24 DIAGNOSIS — M25521 Pain in right elbow: Secondary | ICD-10-CM | POA: Diagnosis not present

## 2021-05-24 DIAGNOSIS — G8929 Other chronic pain: Secondary | ICD-10-CM

## 2021-05-24 DIAGNOSIS — M545 Low back pain, unspecified: Secondary | ICD-10-CM

## 2021-05-24 DIAGNOSIS — M62830 Muscle spasm of back: Secondary | ICD-10-CM | POA: Diagnosis not present

## 2021-05-24 NOTE — Patient Instructions (Signed)
Access Code: [email or web address] URL: [email or web address] Date: 05/24/2021 Prepared by: Lyndee Hensen  Exercises Supine Posterior Pelvic Tilt - 2 x daily - 1-2 sets - 10 reps Supine March - 1 x daily - 2 sets - 10 reps Supine Bridge - 1 x daily - 1-2 sets - 10 reps Supine Pectoralis Stretch - 1 x daily - 2 sets - 10 reps Supine Figure 4 Piriformis Stretch - 2 x daily - 3 reps - 30 hold Cat Cow - 2 x daily - 1 sets - 10 reps

## 2021-05-24 NOTE — Therapy (Signed)
[facility] [address] [place], [place], [address] Phone: [phone]   Fax:  [phone]  Physical Therapy Evaluation  Patient Details  Name: Samantha Becker MRN: [id number] Date of Birth: 05-13-1956 Referring Provider (PT): Inda Coke   Encounter Date: 05/24/2021   PT End of Session - 05/24/21 2119     Visit Number 1    Number of Visits 12    Date for PT Re-Evaluation 07/05/21    Authorization Type UHC    PT Start Time 0805    PT Stop Time 0845    PT Time Calculation (min) 40 min    Activity Tolerance Patient tolerated treatment well    Behavior During Therapy WFL for tasks assessed/performed             Past Medical History:  Diagnosis Date   Arthritis    Blood type, Rh negative    Chicken pox    GERD (gastroesophageal reflux disease)    Leaking of urine    Low iron    Mumps     Past Surgical History:  Procedure Laterality Date   CESAREAN SECTION     4 cs   LAPAROSCOPY  03/04/2012   Procedure: LAPAROSCOPY OPERATIVE;  Surgeon: Eldred Manges, MD;  Location: [facility];  Service: Gynecology;  Laterality: N/A;  Peritoneum Biopsy   TUBAL LIGATION Bilateral     There were no vitals filed for this visit.    Subjective Assessment - 05/24/21 0809     Subjective Pt states chronic SI pain. Likes walking and yoga. Has increased pain with most activities recently, caring for grandchildren, exercie, and IADLs. She has seen chiropractor  weekly in the last month or so. She also has L shoulder pain, UT , shoulder blade, tightness that has been longstanding,   Also notes tingling/pain in R arm with prolonged elbow flexion, does improve some with position changes.    Pertinent History Chronic SI pain, 4 c-sections.    Limitations Lifting;Standing;House hold activities    Currently in Pain? Yes    Pain Score 7     Pain Location Back    Pain Orientation Right    Pain Descriptors / Indicators Aching    Pain Type  Chronic pain    Pain Onset More than a month ago    Pain Frequency Intermittent    Aggravating Factors  increased activity, driving, sleeping,    Multiple Pain Sites Yes    Pain Score 5    Pain Location Shoulder    Pain Orientation Left    Pain Descriptors / Indicators Aching    Pain Type Acute pain    Pain Onset More than a month ago    Pain Frequency Intermittent                [facility] PT Assessment - 05/24/21 0001       Assessment   Medical Diagnosis R SI pain, L UT pain    Referring Provider (PT) Inda Coke    Hand Dominance Right      Precautions   Precautions None      Balance Screen   Has the patient fallen in the past 6 months No      Prior Function   Level of Independence Independent      Cognition   Overall Cognitive Status Within Functional Limits for tasks assessed      ROM / Strength   AROM / PROM / Strength AROM;Strength  AROM   Overall AROM Comments Lumbar: mild limitation for flex/ext;  Hips: WNL; Shoulders: WNL, Cervical: WNL; R elbow: WNL      Strength   Overall Strength Comments Hips: 4+/5, Core: significant instability and weakness seen with activities and testing today;  Shoulders: 4+/5      Palpation   Palpation comment Full ROM/Strength of R elbow, no increased pain with short duration flexion position;  L shoulder GHJ testing negative;  L UT, levator, rhomobid with painful trigger points;  Pain in R SI, R glute                        Objective measurements completed on examination: See above findings.       [facility] Adult PT Treatment/Exercise - 05/24/21 0001       Exercises   Exercises Lumbar;Shoulder      Lumbar Exercises: Stretches   Pelvic Tilt 20 reps    Piriformis Stretch 3 reps;30 seconds    Piriformis Stretch Limitations supine fig 4;      Lumbar Exercises: Supine   Bent Knee Raise 15 reps    Bridge 15 reps      Lumbar Exercises: Quadruped   Madcat/Old Horse 10 reps      Shoulder Exercises:  Stretch   Other Shoulder Stretches Supine pec stretch with nerve glide on R; x10;                     PT Education - 05/24/21 2119     Education Details PT POC, Exam findings, HEP    Person(s) Educated Patient    Methods Explanation;Demonstration;Tactile cues;Verbal cues;Handout    Comprehension Verbalized understanding;Returned demonstration;Verbal cues required;Tactile cues required;Need further instruction              PT Short Term Goals - 05/24/21 2130       PT SHORT TERM GOAL #1   Title Pt to be independent with initial HEP    Time 2    Period Weeks    Status New    Target Date 06/07/21               PT Long Term Goals - 05/24/21 2130       PT LONG TERM GOAL #1   Title Pt to be independent with final HEP    Time 6    Period Weeks    Status New    Target Date 07/05/21      PT LONG TERM GOAL #2   Title Pt to report decreased pain in R SI with standing , bending, and walking activities    Time 6    Period Weeks    Status New    Target Date 07/05/21      PT LONG TERM GOAL #3   Title Pt to demo decreased muscle tension and pain in L UT region, to 0-2/10 to improve abilityfor lifting, carrying, and IADLs.    Time 6    Period Weeks    Status New    Target Date 07/05/21      PT LONG TERM GOAL #4   Title Pt to demo improved strength and stability of core muscles, to be [facility], with improved control, and  minimal postural changes seen with stability exercises.    Time 6    Period Weeks    Status New    Target Date 07/05/21  Plan - 05/24/21 2123     Clinical Impression Statement Pt presents with primary complaint of increased pain in R SI, L shoulder/neck, and R elbow. Pt with most pain in R SI/low back, very chronic. She does have much core weakness and instability seen with testing today. She is active, but has lack of effective HEP for her dx. Pt also has pain in R elbow, with paresthesias with prolonged elbow  flexion, consistent with cubital tunnel syndrome. Pt with pain, and muscle tightness in L UT, levator and into rhomboid. She has increased pain with IADLS, caring for and holding grandchildren, and most functional activities. Pt to benefit from skilled PT to improve deficits and pain.    Personal Factors and Comorbidities Time since onset of injury/illness/exacerbation    Examination-Activity Limitations Lift;Stand;Bend;Transfers;Carry;Squat    Examination-Participation Restrictions Cleaning;Meal Prep;Yard Work;Community Activity;Driving;Shop    Stability/Clinical Decision Making Stable/Uncomplicated    Clinical Decision Making Low    Rehab Potential Good    PT Frequency 2x / week    PT Duration 6 weeks    PT Treatment/Interventions ADLs/Self Care Home Management;Cryotherapy;Scientist, product/process development;Iontophoresis 4mg /ml Dexamethasone;Moist Heat;Traction;Ultrasound;Balance training;Therapeutic exercise;Therapeutic activities;Stair training;Gait training;Neuromuscular re-education;Patient/family education;Orthotic Fit/Training;Manual techniques;Passive range of motion;Energy conservation;Taping;Vasopneumatic Device;Spinal Manipulations;Joint Manipulations    Consulted and Agree with Plan of Care Patient             Patient will benefit from skilled therapeutic intervention in order to improve the following deficits and impairments:  Pain, Decreased mobility, Increased muscle spasms, Decreased strength, Decreased activity tolerance, Impaired flexibility  Visit Diagnosis: Chronic right-sided low back pain without sciatica  Muscle spasm of back  Pain in right elbow     Problem List Patient Active Problem List   Diagnosis Date Noted   Atrophic vaginitis 03/20/2017   Endometriosis 03/18/2012   Dyspareunia 02/01/2012   Lyndee Hensen, PT, DPT 9:41 PM  05/24/21    [facility] [place], [place], [address] Phone:  [phone]   Fax:  [phone]  Name: Samantha Becker MRN: [id number] Date of Birth: 1956/07/03

## 2021-05-29 ENCOUNTER — Other Ambulatory Visit: Payer: Self-pay

## 2021-05-29 ENCOUNTER — Ambulatory Visit (INDEPENDENT_AMBULATORY_CARE_PROVIDER_SITE_OTHER): Payer: 59 | Admitting: Physical Therapy

## 2021-05-29 DIAGNOSIS — G8929 Other chronic pain: Secondary | ICD-10-CM

## 2021-05-29 DIAGNOSIS — M545 Low back pain, unspecified: Secondary | ICD-10-CM

## 2021-05-29 DIAGNOSIS — M25521 Pain in right elbow: Secondary | ICD-10-CM

## 2021-05-29 DIAGNOSIS — M62830 Muscle spasm of back: Secondary | ICD-10-CM | POA: Diagnosis not present

## 2021-05-31 ENCOUNTER — Ambulatory Visit (INDEPENDENT_AMBULATORY_CARE_PROVIDER_SITE_OTHER): Payer: 59 | Admitting: Physical Therapy

## 2021-05-31 ENCOUNTER — Other Ambulatory Visit: Payer: Self-pay

## 2021-05-31 DIAGNOSIS — M545 Low back pain, unspecified: Secondary | ICD-10-CM

## 2021-05-31 DIAGNOSIS — M25521 Pain in right elbow: Secondary | ICD-10-CM

## 2021-05-31 DIAGNOSIS — G8929 Other chronic pain: Secondary | ICD-10-CM

## 2021-05-31 DIAGNOSIS — M62830 Muscle spasm of back: Secondary | ICD-10-CM | POA: Diagnosis not present

## 2021-05-31 NOTE — Therapy (Signed)
[facility] [address] [place], [place], [address] Phone: [phone]   Fax:  [phone]  Physical Therapy Treatment  Patient Details  Name: Samantha Becker MRN: [id number] Date of Birth: [birth year] Referring Provider (PT): Inda Coke   Encounter Date: 05/31/2021   PT End of Session - 05/31/21 1408     Visit Number 3    Number of Visits 12    Date for PT Re-Evaluation 07/05/21    Authorization Type UHC    PT Start Time 1018    PT Stop Time 1100    PT Time Calculation (min) 42 min    Activity Tolerance Patient tolerated treatment well    Behavior During Therapy WFL for tasks assessed/performed             Past Medical History:  Diagnosis Date   Arthritis    Blood type, Rh negative    Chicken pox    GERD (gastroesophageal reflux disease)    Leaking of urine    Low iron    Mumps     Past Surgical History:  Procedure Laterality Date   CESAREAN SECTION     4 cs   LAPAROSCOPY  03/04/2012   Procedure: LAPAROSCOPY OPERATIVE;  Surgeon: Eldred Manges, MD;  Location: [facility];  Service: Gynecology;  Laterality: N/A;  Peritoneum Biopsy   TUBAL LIGATION Bilateral     There were no vitals filed for this visit.   Subjective Assessment - 05/31/21 1407     Subjective Pt states soreness in R SI today. Shoulder/neck pain doing better since last visit.    Currently in Pain? Yes    Pain Score 6     Pain Location Back    Pain Orientation Right    Pain Descriptors / Indicators Aching    Pain Type Chronic pain    Pain Onset More than a month ago    Pain Frequency Intermittent    Pain Score 2    Pain Location Shoulder    Pain Orientation Right;Left    Pain Descriptors / Indicators Aching    Pain Type Acute pain    Pain Onset More than a month ago    Pain Frequency Intermittent                               [facility] Adult PT Treatment/Exercise - 05/31/21 1411       Exercises   Exercises  Lumbar;Shoulder      Lumbar Exercises: Stretches   Double Knee to Chest Stretch 3 reps;30 seconds    Lower Trunk Rotation 5 reps;10 seconds    Pelvic Tilt 20 reps    Piriformis Stretch 3 reps;30 seconds    Piriformis Stretch Limitations seated      Lumbar Exercises: Standing   Other Standing Lumbar Exercises Paloff press Bl TB x 15 ibil;    Other Standing Lumbar Exercises shoulder elevation with TA and education for optimal lumbar posture. x 10;      Lumbar Exercises: Supine   Clam 20 reps    Clam Limitations with TA  x10 alternating, x10 bil;    Bridge 15 reps    Straight Leg Raise 10 reps      Lumbar Exercises: Sidelying   Hip Abduction 20 reps      Shoulder Exercises: Stretch   Other Shoulder Stretches Ulnar nerve glide x 15;    Other Shoulder Stretches seated ulnar nerve glide  x 10; Upper trap and levator stretches 30 sec x 3 bil;      Modalities   Modalities Moist Heat      Moist Heat Therapy   Number Minutes Moist Heat 10 Minutes    Moist Heat Location Hip      Manual Therapy   Manual Therapy Joint mobilization;Soft tissue mobilization;Manual Traction;Passive ROM    Manual therapy comments skilled palpation and monitoring of soft tissue with dry needling    Soft tissue mobilization DTM/TPR to R glute, glute med, min, piriformis    Passive ROM manual UT and levator stretches    Manual Traction long leg distraction on R for lumbar pump x 2 min;              Trigger Point Dry Needling - 05/31/21 0001     Consent Given? Yes    Education Handout Provided Yes    Muscles Treated Back/Hip Gluteus minimus;Gluteus medius;Piriformis    Gluteus Minimus Response Palpable increased muscle length   R   Gluteus Medius Response Twitch response elicited;Palpable increased muscle length   R   Piriformis Response Palpable increased muscle length   R                    PT Short Term Goals - 05/24/21 2130       PT SHORT TERM GOAL #1   Title Pt to be independent  with initial HEP    Time 2    Period Weeks    Status New    Target Date 06/07/21               PT Long Term Goals - 05/24/21 2130       PT LONG TERM GOAL #1   Title Pt to be independent with final HEP    Time 6    Period Weeks    Status New    Target Date 07/05/21      PT LONG TERM GOAL #2   Title Pt to report decreased pain in R SI with standing , bending, and walking activities    Time 6    Period Weeks    Status New    Target Date 07/05/21      PT LONG TERM GOAL #3   Title Pt to demo decreased muscle tension and pain in L UT region, to 0-2/10 to improve abilityfor lifting, carrying, and IADLs.    Time 6    Period Weeks    Status New    Target Date 07/05/21      PT LONG TERM GOAL #4   Title Pt to demo improved strength and stability of core muscles, to be [facility], with improved control, and  minimal postural changes seen with stability exercises.    Time 6    Period Weeks    Status New    Target Date 07/05/21                   Plan - 05/31/21 1409     Clinical Impression Statement Pt with much tenderness in R glute today, addressed with DTM/TPR to same area. Education and practice today for TA and core control in supine, as well as in standing position. Pt to benefit from continued release of muscle tension in R glute for decreased pain, as well as progressive strengthening for hips, core, and postural muscles.    Personal Factors and Comorbidities Time since onset of injury/illness/exacerbation    Examination-Activity Limitations Lift;Stand;Bend;Transfers;Carry;Squat  Examination-Participation Restrictions Cleaning;Meal Prep;Yard Work;Community Activity;Driving;Shop    Stability/Clinical Decision Making Stable/Uncomplicated    Rehab Potential Good    PT Frequency 2x / week    PT Duration 6 weeks    PT Treatment/Interventions ADLs/Self Care Home Management;Cryotherapy;Scientist, product/process development;Iontophoresis 4mg /ml Dexamethasone;Moist  Heat;Traction;Ultrasound;Balance training;Therapeutic exercise;Therapeutic activities;Stair training;Gait training;Neuromuscular re-education;Patient/family education;Orthotic Fit/Training;Manual techniques;Passive range of motion;Energy conservation;Taping;Vasopneumatic Device;Spinal Manipulations;Joint Manipulations    Consulted and Agree with Plan of Care Patient             Patient will benefit from skilled therapeutic intervention in order to improve the following deficits and impairments:  Pain, Decreased mobility, Increased muscle spasms, Decreased strength, Decreased activity tolerance, Impaired flexibility  Visit Diagnosis: Chronic right-sided low back pain without sciatica  Muscle spasm of back  Pain in right elbow     Problem List Patient Active Problem List   Diagnosis Date Noted   Atrophic vaginitis 03/20/2017   Endometriosis 03/18/2012   Dyspareunia 02/01/2012   Lyndee Hensen, PT, DPT 2:16 PM  05/31/21   [facility] [place], [place], [address] Phone: [phone]   Fax:  [phone]  Name: Samantha Becker MRN: [id number] Date of Birth: 1955-11-01

## 2021-05-31 NOTE — Therapy (Signed)
[facility] [address] [place], [place], [address] Phone: [phone]   Fax:  [phone]  Physical Therapy Treatment  Patient Details  Name: Samantha Becker MRN: [id number] Date of Birth: [birth year] Referring Provider (PT): Inda Coke   Encounter Date: 05/29/2021   PT End of Session - 05/31/21 1401     Visit Number 2    Number of Visits 12    Date for PT Re-Evaluation 07/05/21    Authorization Type UHC    PT Start Time 1017    PT Stop Time 1100    PT Time Calculation (Becker) 43 Becker    Activity Tolerance Patient tolerated treatment well    Behavior During Therapy WFL for tasks assessed/performed             Past Medical History:  Diagnosis Date   Arthritis    Blood type, Rh negative    Chicken pox    GERD (gastroesophageal reflux disease)    Leaking of urine    Low iron    Mumps     Past Surgical History:  Procedure Laterality Date   CESAREAN SECTION     4 cs   LAPAROSCOPY  03/04/2012   Procedure: LAPAROSCOPY OPERATIVE;  Surgeon: Eldred Manges, MD;  Location: [facility];  Service: Gynecology;  Laterality: N/A;  Peritoneum Biopsy   TUBAL LIGATION Bilateral     There were no vitals filed for this visit.   Subjective Assessment - 05/31/21 1401     Subjective Pt states soreness in shoulder today.    Patient Stated Goals decreased pain    Currently in Pain? Yes    Pain Score 5     Pain Location Back    Pain Orientation Right    Pain Descriptors / Indicators Aching    Pain Type Chronic pain    Pain Onset More than a month ago    Pain Frequency Intermittent    Pain Score 5    Pain Location Shoulder    Pain Orientation Left    Pain Descriptors / Indicators Aching    Pain Type Acute pain    Pain Onset More than a month ago    Pain Frequency Intermittent                               [facility] Adult PT Treatment/Exercise - 05/31/21 1406       Exercises   Exercises  Lumbar;Shoulder      Lumbar Exercises: Stretches   Pelvic Tilt 20 reps    Piriformis Stretch 3 reps;30 seconds    Piriformis Stretch Limitations supine fig 4;      Lumbar Exercises: Supine   Clam 20 reps    Clam Limitations with TA  x10 alternating, x10 bil;    Bent Knee Raise 15 reps    Bridge 15 reps    Straight Leg Raise 10 reps      Lumbar Exercises: Quadruped   Madcat/Old Horse 10 reps      Shoulder Exercises: Stretch   Other Shoulder Stretches Supine pec stretch with nerve glide on R; x10;    Other Shoulder Stretches seated ulnar nerve glide x 10; Upper trap and levator stretches 30 sec x 3 bil;      Manual Therapy   Manual Therapy Joint mobilization;Soft tissue mobilization;Manual Traction;Passive ROM    Soft tissue mobilization STM/ TPR to bil UT, levator, into rhomobids,  Passive ROM manual UT and levator stretches    Manual Traction Light cervical distraction 10 sec x 8 ;                       PT Short Term Goals - 05/24/21 2130       PT SHORT TERM GOAL #1   Title Pt to be independent with initial HEP    Time 2    Period Weeks    Status New    Target Date 06/07/21               PT Long Term Goals - 05/24/21 2130       PT LONG TERM GOAL #1   Title Pt to be independent with final HEP    Time 6    Period Weeks    Status New    Target Date 07/05/21      PT LONG TERM GOAL #2   Title Pt to report decreased pain in R SI with standing , bending, and walking activities    Time 6    Period Weeks    Status New    Target Date 07/05/21      PT LONG TERM GOAL #3   Title Pt to demo decreased muscle tension and pain in L UT region, to 0-2/10 to improve abilityfor lifting, carrying, and IADLs.    Time 6    Period Weeks    Status New    Target Date 07/05/21      PT LONG TERM GOAL #4   Title Pt to demo improved strength and stability of core muscles, to be [facility], with improved control, and  minimal postural changes seen with stability  exercises.    Time 6    Period Weeks    Status New    Target Date 07/05/21                   Plan - 05/31/21 1402     Clinical Impression Statement Pt with soreness in bil UT, levator and rhomboid region, addressed with manual and ther ex today. Added upper trap and levator stretches for muscle tightenss. Pt able to progress core/hip stabilization, with difficulty due to weakness, but no pain. Pt to benefit from continued strengthening and education on optimal lumbar/neutral posture.    Personal Factors and Comorbidities Time since onset of injury/illness/exacerbation    Examination-Activity Limitations Lift;Stand;Bend;Transfers;Carry;Squat    Examination-Participation Restrictions Cleaning;Meal Prep;Yard Work;Community Activity;Driving;Shop    Stability/Clinical Decision Making Stable/Uncomplicated    Rehab Potential Good    PT Frequency 2x / week    PT Duration 6 weeks    PT Treatment/Interventions ADLs/Self Care Home Management;Cryotherapy;Scientist, product/process development;Iontophoresis 4mg /ml Dexamethasone;Moist Heat;Traction;Ultrasound;Balance training;Therapeutic exercise;Therapeutic activities;Stair training;Gait training;Neuromuscular re-education;Patient/family education;Orthotic Fit/Training;Manual techniques;Passive range of motion;Energy conservation;Taping;Vasopneumatic Device;Spinal Manipulations;Joint Manipulations    Consulted and Agree with Plan of Care Patient             Patient will benefit from skilled therapeutic intervention in order to improve the following deficits and impairments:  Pain, Decreased mobility, Increased muscle spasms, Decreased strength, Decreased activity tolerance, Impaired flexibility  Visit Diagnosis: Chronic right-sided low back pain without sciatica  Muscle spasm of back  Pain in right elbow     Problem List Patient Active Problem List   Diagnosis Date Noted   Atrophic vaginitis 03/20/2017   Endometriosis  03/18/2012   Dyspareunia 02/01/2012    Lyndee Hensen, PT, DPT 2:07 PM  05/31/21  [facility] [address] [place], [place], [address] Phone: [phone]   Fax:  [phone]  Name: Samantha Becker MRN: [id number] Date of Birth: [birth year]

## 2021-06-05 ENCOUNTER — Encounter: Payer: Self-pay | Admitting: Physical Therapy

## 2021-06-05 ENCOUNTER — Other Ambulatory Visit: Payer: Self-pay

## 2021-06-05 ENCOUNTER — Ambulatory Visit (INDEPENDENT_AMBULATORY_CARE_PROVIDER_SITE_OTHER): Payer: 59 | Admitting: Physical Therapy

## 2021-06-05 DIAGNOSIS — M62830 Muscle spasm of back: Secondary | ICD-10-CM

## 2021-06-05 DIAGNOSIS — M545 Low back pain, unspecified: Secondary | ICD-10-CM

## 2021-06-05 DIAGNOSIS — G8929 Other chronic pain: Secondary | ICD-10-CM | POA: Diagnosis not present

## 2021-06-05 DIAGNOSIS — M25521 Pain in right elbow: Secondary | ICD-10-CM | POA: Diagnosis not present

## 2021-06-05 NOTE — Therapy (Signed)
[facility] [address] [place], [place], [address] Phone: [phone]   Fax:  [phone]  Physical Therapy Treatment  Patient Details  Name: Samantha Becker MRN: [id number] Date of Birth: 12/17/1955 Referring Provider (PT): Inda Coke   Encounter Date: 06/05/2021   PT End of Session - 06/05/21 1329     Visit Number 4    Number of Visits 12    Date for PT Re-Evaluation 07/05/21    Authorization Type UHC    PT Start Time 1107    PT Stop Time 1145    PT Time Calculation (min) 38 min    Activity Tolerance Patient tolerated treatment well    Behavior During Therapy WFL for tasks assessed/performed             Past Medical History:  Diagnosis Date   Arthritis    Blood type, Rh negative    Chicken pox    GERD (gastroesophageal reflux disease)    Leaking of urine    Low iron    Mumps     Past Surgical History:  Procedure Laterality Date   CESAREAN SECTION     4 cs   LAPAROSCOPY  03/04/2012   Procedure: LAPAROSCOPY OPERATIVE;  Surgeon: Eldred Manges, MD;  Location: [facility];  Service: Gynecology;  Laterality: N/A;  Peritoneum Biopsy   TUBAL LIGATION Bilateral     There were no vitals filed for this visit.   Subjective Assessment - 06/05/21 1116     Subjective Pt states decreased pain in hip pain since needling last visit. Shoulders also feeling better.    Currently in Pain? Yes    Pain Score 4     Pain Location Back    Pain Orientation Right    Pain Descriptors / Indicators Aching    Pain Type Chronic pain    Pain Onset More than a month ago    Pain Frequency Intermittent    Pain Score 0    Pain Location Shoulder    Pain Orientation Right;Left                               [facility] Adult PT Treatment/Exercise - 06/05/21 0001       Lumbar Exercises: Stretches   Piriformis Stretch 3 reps;30 seconds    Piriformis Stretch Limitations seated and supine      Lumbar Exercises:  Standing   Other Standing Lumbar Exercises shoulder elevation with TA and education for optimal lumbar posture. x 10;      Lumbar Exercises: Supine   Clam 20 reps    Clam Limitations with TA  x10 alternating, x10 bil; blue TB    Bridge 15 reps      Lumbar Exercises: Sidelying   Clam 10 reps    Hip Abduction 20 reps      Lumbar Exercises: Quadruped   Plank high plank 30 sec x 3 with educaiton on form.      Shoulder Exercises: Standing   External Rotation 15 reps    Theraband Level (Shoulder External Rotation) Level 2 (Red)    Row 20 reps    Theraband Level (Shoulder Row) Level 3 (Green)      Manual Therapy   Soft tissue mobilization DTM/TPR to R glute, glute med, min, piriformis                       PT Short Term  Goals - 05/24/21 2130       PT SHORT TERM GOAL #1   Title Pt to be independent with initial HEP    Time 2    Period Weeks    Status New    Target Date 06/07/21               PT Long Term Goals - 05/24/21 2130       PT LONG TERM GOAL #1   Title Pt to be independent with final HEP    Time 6    Period Weeks    Status New    Target Date 07/05/21      PT LONG TERM GOAL #2   Title Pt to report decreased pain in R SI with standing , bending, and walking activities    Time 6    Period Weeks    Status New    Target Date 07/05/21      PT LONG TERM GOAL #3   Title Pt to demo decreased muscle tension and pain in L UT region, to 0-2/10 to improve abilityfor lifting, carrying, and IADLs.    Time 6    Period Weeks    Status New    Target Date 07/05/21      PT LONG TERM GOAL #4   Title Pt to demo improved strength and stability of core muscles, to be [facility], with improved control, and  minimal postural changes seen with stability exercises.    Time 6    Period Weeks    Status New    Target Date 07/05/21                   Plan - 06/05/21 1330     Clinical Impression Statement Pt still has tenderness in R glute region with  palpation today, but somewhat decreased from last visit. Did have good pain relief from DN last visit, may benefit from future sessoins with that. Continued focus on hip and core strength and stability, pt challenged with this on R>L due to weakness. Plan to progress strenth as tolerated and continue manual as needed for pain.    Personal Factors and Comorbidities Time since onset of injury/illness/exacerbation    Examination-Activity Limitations Lift;Stand;Bend;Transfers;Carry;Squat    Examination-Participation Restrictions Cleaning;Meal Prep;Yard Work;Community Activity;Driving;Shop    Stability/Clinical Decision Making Stable/Uncomplicated    Rehab Potential Good    PT Frequency 2x / week    PT Duration 6 weeks    PT Treatment/Interventions ADLs/Self Care Home Management;Cryotherapy;Scientist, product/process development;Iontophoresis 4mg /ml Dexamethasone;Moist Heat;Traction;Ultrasound;Balance training;Therapeutic exercise;Therapeutic activities;Stair training;Gait training;Neuromuscular re-education;Patient/family education;Orthotic Fit/Training;Manual techniques;Passive range of motion;Energy conservation;Taping;Vasopneumatic Device;Spinal Manipulations;Joint Manipulations    Consulted and Agree with Plan of Care Patient             Patient will benefit from skilled therapeutic intervention in order to improve the following deficits and impairments:  Pain, Decreased mobility, Increased muscle spasms, Decreased strength, Decreased activity tolerance, Impaired flexibility  Visit Diagnosis: Chronic right-sided low back pain without sciatica  Muscle spasm of back  Pain in right elbow     Problem List Patient Active Problem List   Diagnosis Date Noted   Atrophic vaginitis 03/20/2017   Endometriosis 03/18/2012   Dyspareunia 02/01/2012    Lyndee Hensen, PT, DPT 1:32 PM  06/05/21   [facility] [place], [place],  [address] Phone: [phone]   Fax:  [phone]  Name: Samantha Becker MRN: [id number] Date of Birth: 11-12-1955

## 2021-06-07 ENCOUNTER — Encounter: Payer: Self-pay | Admitting: Physical Therapy

## 2021-06-07 ENCOUNTER — Ambulatory Visit (INDEPENDENT_AMBULATORY_CARE_PROVIDER_SITE_OTHER): Payer: 59 | Admitting: Physical Therapy

## 2021-06-07 ENCOUNTER — Other Ambulatory Visit: Payer: Self-pay

## 2021-06-07 DIAGNOSIS — M62830 Muscle spasm of back: Secondary | ICD-10-CM

## 2021-06-07 DIAGNOSIS — G8929 Other chronic pain: Secondary | ICD-10-CM

## 2021-06-07 DIAGNOSIS — M545 Low back pain, unspecified: Secondary | ICD-10-CM | POA: Diagnosis not present

## 2021-06-07 DIAGNOSIS — M25521 Pain in right elbow: Secondary | ICD-10-CM | POA: Diagnosis not present

## 2021-06-07 NOTE — Therapy (Signed)
[facility] [address] [place], [place], [address] Phone: [phone]   Fax:  [phone]  Physical Therapy Treatment  Patient Details  Name: Samantha Becker MRN: [id number] Date of Birth: [birth year] Referring Provider (PT): Inda Coke   Encounter Date: 06/07/2021   PT End of Session - 06/07/21 0915     Visit Number 5    Number of Visits 12    Date for PT Re-Evaluation 07/05/21    Authorization Type UHC    PT Start Time 0803    PT Stop Time 0855    PT Time Calculation (min) 52 min    Activity Tolerance Patient tolerated treatment well    Behavior During Therapy WFL for tasks assessed/performed             Past Medical History:  Diagnosis Date   Arthritis    Blood type, Rh negative    Chicken pox    GERD (gastroesophageal reflux disease)    Leaking of urine    Low iron    Mumps     Past Surgical History:  Procedure Laterality Date   CESAREAN SECTION     4 cs   LAPAROSCOPY  03/04/2012   Procedure: LAPAROSCOPY OPERATIVE;  Surgeon: Eldred Manges, MD;  Location: [facility];  Service: Gynecology;  Laterality: N/A;  Peritoneum Biopsy   TUBAL LIGATION Bilateral     There were no vitals filed for this visit.   Subjective Assessment - 06/07/21 0914     Subjective Pt states soreness in R hip/glute after barre class yesterday.    Currently in Pain? Yes    Pain Location Hip    Pain Orientation Right    Pain Descriptors / Indicators Aching    Pain Type Chronic pain    Pain Onset More than a month ago    Pain Frequency Intermittent                               [facility] Adult PT Treatment/Exercise - 06/07/21 0001       Lumbar Exercises: Stretches   Hip Flexor Stretch 3 reps;30 seconds    Hip Flexor Stretch Limitations kneeling    Piriformis Stretch 3 reps;30 seconds    Piriformis Stretch Limitations seated      Lumbar Exercises: Aerobic   Recumbent Bike L2 x 6 min;      Lumbar  Exercises: Standing   Other Standing Lumbar Exercises Hip abd and ext x 10 ea- review for form for  barre class.    Other Standing Lumbar Exercises --      Lumbar Exercises: Supine   Clam --    Clam Limitations --    Bridge --      Lumbar Exercises: Sidelying   Clam 15 reps    Hip Abduction 15 reps      Lumbar Exercises: Quadruped   Plank --      Shoulder Exercises: Standing   External Rotation 15 reps    Theraband Level (Shoulder External Rotation) Level 2 (Red)    Row --    Theraband Level (Shoulder Row) --      Moist Heat Therapy   Number Minutes Moist Heat 10 Minutes    Moist Heat Location Hip      Manual Therapy   Manual Therapy Joint mobilization    Manual therapy comments skilled palpation and monitoring of soft tissue with dry needling    Joint  Mobilization R hip, Inf, and lateral mobs with strap, Post mobs, LAD for hip .    Soft tissue mobilization DTM/TPR to R glute, glute med, min, piriformis    Passive ROM manual stretching for R piriformis and hip ER              Trigger Point Dry Needling - 06/07/21 0001     Consent Given? Yes    Education Handout Provided Previously provided    Muscles Treated Back/Hip Gluteus minimus;Gluteus medius;Piriformis    Gluteus Minimus Response Palpable increased muscle length   R   Gluteus Medius Response Palpable increased muscle length   R   Piriformis Response Palpable increased muscle length;Twitch response elicited   R                    PT Short Term Goals - 05/24/21 2130       PT SHORT TERM GOAL #1   Title Pt to be independent with initial HEP    Time 2    Period Weeks    Status New    Target Date 06/07/21               PT Long Term Goals - 05/24/21 2130       PT LONG TERM GOAL #1   Title Pt to be independent with final HEP    Time 6    Period Weeks    Status New    Target Date 07/05/21      PT LONG TERM GOAL #2   Title Pt to report decreased pain in R SI with standing , bending,  and walking activities    Time 6    Period Weeks    Status New    Target Date 07/05/21      PT LONG TERM GOAL #3   Title Pt to demo decreased muscle tension and pain in L UT region, to 0-2/10 to improve abilityfor lifting, carrying, and IADLs.    Time 6    Period Weeks    Status New    Target Date 07/05/21      PT LONG TERM GOAL #4   Title Pt to demo improved strength and stability of core muscles, to be [facility], with improved control, and  minimal postural changes seen with stability exercises.    Time 6    Period Weeks    Status New    Target Date 07/05/21                   Plan - 06/07/21 0915     Clinical Impression Statement Pt with soreness in R glute today, tightness and trigger points noted, DN and DTM done to address, due to previous + response. She does have stiffness in R hip joint vs L, mobilizations done today to improve. Discussed continued strengthening for hip abd and rotation, but not over doing activity with exercise if hip is getting sore.    Personal Factors and Comorbidities Time since onset of injury/illness/exacerbation    Examination-Activity Limitations Lift;Stand;Bend;Transfers;Carry;Squat    Examination-Participation Restrictions Cleaning;Meal Prep;Yard Work;Community Activity;Driving;Shop    Stability/Clinical Decision Making Stable/Uncomplicated    Rehab Potential Good    PT Frequency 2x / week    PT Duration 6 weeks    PT Treatment/Interventions ADLs/Self Care Home Management;Cryotherapy;Scientist, product/process development;Iontophoresis 4mg /ml Dexamethasone;Moist Heat;Traction;Ultrasound;Balance training;Therapeutic exercise;Therapeutic activities;Stair training;Gait training;Neuromuscular re-education;Patient/family education;Orthotic Fit/Training;Manual techniques;Passive range of motion;Energy conservation;Taping;Vasopneumatic Device;Spinal Manipulations;Joint Manipulations    Consulted and Agree with Plan of  Care Patient              Patient will benefit from skilled therapeutic intervention in order to improve the following deficits and impairments:  Pain, Decreased mobility, Increased muscle spasms, Decreased strength, Decreased activity tolerance, Impaired flexibility  Visit Diagnosis: Chronic right-sided low back pain without sciatica  Muscle spasm of back  Pain in right elbow     Problem List Patient Active Problem List   Diagnosis Date Noted   Atrophic vaginitis 03/20/2017   Endometriosis 03/18/2012   Dyspareunia 02/01/2012   Lyndee Hensen, PT, DPT 9:19 AM  06/07/21    [facility] [place], [place], [address] Phone: [phone]   Fax:  [phone]  Name: Samantha Becker MRN: [id number] Date of Birth: 29-Jan-1956

## 2021-06-12 ENCOUNTER — Encounter: Payer: Self-pay | Admitting: Physical Therapy

## 2021-06-12 ENCOUNTER — Ambulatory Visit (INDEPENDENT_AMBULATORY_CARE_PROVIDER_SITE_OTHER): Payer: 59 | Admitting: Physical Therapy

## 2021-06-12 ENCOUNTER — Other Ambulatory Visit: Payer: Self-pay

## 2021-06-12 DIAGNOSIS — M62830 Muscle spasm of back: Secondary | ICD-10-CM | POA: Diagnosis not present

## 2021-06-12 DIAGNOSIS — M25521 Pain in right elbow: Secondary | ICD-10-CM | POA: Diagnosis not present

## 2021-06-12 DIAGNOSIS — M545 Low back pain, unspecified: Secondary | ICD-10-CM | POA: Diagnosis not present

## 2021-06-12 DIAGNOSIS — G8929 Other chronic pain: Secondary | ICD-10-CM

## 2021-06-13 NOTE — Therapy (Signed)
[facility] [address] [place], [place], [address] Phone: [phone]   Fax:  [phone]  Physical Therapy Treatment  Patient Details  Name: Samantha Becker MRN: [id number] Date of Birth: 08/27/1956 Referring Provider (PT): Inda Coke   Encounter Date: 06/12/2021   PT End of Session - 06/12/21 0806     Visit Number 6    Number of Visits 12    Date for PT Re-Evaluation 07/05/21    Authorization Type UHC    PT Start Time 0803    PT Stop Time 0845    PT Time Calculation (min) 42 min    Activity Tolerance Patient tolerated treatment well    Behavior During Therapy WFL for tasks assessed/performed             Past Medical History:  Diagnosis Date   Arthritis    Blood type, Rh negative    Chicken pox    GERD (gastroesophageal reflux disease)    Leaking of urine    Low iron    Mumps     Past Surgical History:  Procedure Laterality Date   CESAREAN SECTION     4 cs   LAPAROSCOPY  03/04/2012   Procedure: LAPAROSCOPY OPERATIVE;  Surgeon: Eldred Manges, MD;  Location: [facility];  Service: Gynecology;  Laterality: N/A;  Peritoneum Biopsy   TUBAL LIGATION Bilateral     There were no vitals filed for this visit.   Subjective Assessment - 06/12/21 0800     Subjective Pt states improving pain in glute, thinks dry needling was helpful.  Did not do much exercise over the weekend. Did ride in the car for a long time with much less pain, and also was able to sleep with less pain. Place at top of R shoulder is still sore .    Currently in Pain? Yes    Pain Location Hip    Pain Orientation Right    Pain Descriptors / Indicators Aching    Pain Type Chronic pain    Pain Onset More than a month ago    Pain Frequency Intermittent    Pain Score 3    Pain Location Shoulder    Pain Orientation Right    Pain Descriptors / Indicators Aching    Pain Type Acute pain    Pain Onset More than a month ago    Pain Frequency  Intermittent                               [facility] Adult PT Treatment/Exercise - 06/13/21 0001       Lumbar Exercises: Stretches   Hip Flexor Stretch 3 reps;30 seconds    Hip Flexor Stretch Limitations kneeling    Piriformis Stretch 3 reps;30 seconds    Piriformis Stretch Limitations seated      Lumbar Exercises: Aerobic   Recumbent Bike L2 x 5 min;      Lumbar Exercises: Standing   Other Standing Lumbar Exercises Hip abd x10,  x10 bil with YTB    Other Standing Lumbar Exercises Fwd and lateral step ups x 10 ea on R;      Lumbar Exercises: Supine   Bridge with clamshell 20 reps    Bridge with Ball Squeeze Limitations Blue TB      Lumbar Exercises: Sidelying   Clam 15 reps    Hip Abduction 15 reps      Shoulder Exercises: Standing  External Rotation 15 reps    Theraband Level (Shoulder External Rotation) Level 2 (Red)      Shoulder Exercises: Stretch   Corner Stretch 3 reps;30 seconds    Corner Stretch Limitations doorway    Other Shoulder Stretches review for upper trap stretch for HEP      Manual Therapy   Manual Therapy Joint mobilization    Joint Mobilization R hip, Inf and Post mobs, LAD for hip, LAD for R shoulder, inf and post mobs gr 3 for R GHJ.    Soft tissue mobilization TPR to R glute, glute med, min, piriformis;   STM/DTM R UT, scalenes,    Passive ROM manual stretching for R piriformis and hip ER,  R UT,                       PT Short Term Goals - 06/12/21 0807       PT SHORT TERM GOAL #1   Title Pt to be independent with initial HEP    Time 2    Period Weeks    Status Achieved    Target Date 06/07/21               PT Long Term Goals - 05/24/21 2130       PT LONG TERM GOAL #1   Title Pt to be independent with final HEP    Time 6    Period Weeks    Status New    Target Date 07/05/21      PT LONG TERM GOAL #2   Title Pt to report decreased pain in R SI with standing , bending, and walking activities     Time 6    Period Weeks    Status New    Target Date 07/05/21      PT LONG TERM GOAL #3   Title Pt to demo decreased muscle tension and pain in L UT region, to 0-2/10 to improve abilityfor lifting, carrying, and IADLs.    Time 6    Period Weeks    Status New    Target Date 07/05/21      PT LONG TERM GOAL #4   Title Pt to demo improved strength and stability of core muscles, to be [facility], with improved control, and  minimal postural changes seen with stability exercises.    Time 6    Period Weeks    Status New    Target Date 07/05/21                   Plan - 06/13/21 0851     Clinical Impression Statement Pt with decreasing pain in R glute. Does have tenderness and tightness with palpation today, but improved from previous sessions. Showing improvements with ability for strengthenig as well as pain with activity. Pt to benefit from continued strengthening. Minimal pain with movement of R shoulder today, does have pain with palpation of superior/anterior shoulder, and around clavicle. Updated HEP to include stretches for this.    Personal Factors and Comorbidities Time since onset of injury/illness/exacerbation    Examination-Activity Limitations Lift;Stand;Bend;Transfers;Carry;Squat    Examination-Participation Restrictions Cleaning;Meal Prep;Yard Work;Community Activity;Driving;Shop    Stability/Clinical Decision Making Stable/Uncomplicated    Rehab Potential Good    PT Frequency 2x / week    PT Duration 6 weeks    PT Treatment/Interventions ADLs/Self Care Home Management;Cryotherapy;Scientist, product/process development;Iontophoresis 4mg /ml Dexamethasone;Moist Heat;Traction;Ultrasound;Balance training;Therapeutic exercise;Therapeutic activities;Stair training;Gait training;Neuromuscular re-education;Patient/family education;Orthotic Fit/Training;Manual techniques;Passive range of motion;Energy conservation;Taping;Vasopneumatic Device;Spinal  Manipulations;Joint Manipulations     Consulted and Agree with Plan of Care Patient             Patient will benefit from skilled therapeutic intervention in order to improve the following deficits and impairments:  Pain, Decreased mobility, Increased muscle spasms, Decreased strength, Decreased activity tolerance, Impaired flexibility  Visit Diagnosis: Chronic right-sided low back pain without sciatica  Muscle spasm of back  Pain in right elbow     Problem List Patient Active Problem List   Diagnosis Date Noted   Atrophic vaginitis 03/20/2017   Endometriosis 03/18/2012   Dyspareunia 02/01/2012    Lyndee Hensen, PT, DPT 8:53 AM  06/13/21    [facility] [place], [place], [address] Phone: [phone]   Fax:  [phone]  Name: Samantha Becker MRN: [id number] Date of Birth: July 16, 1956

## 2021-06-14 ENCOUNTER — Encounter: Payer: 59 | Admitting: Physical Therapy

## 2021-06-19 ENCOUNTER — Ambulatory Visit (INDEPENDENT_AMBULATORY_CARE_PROVIDER_SITE_OTHER): Payer: 59 | Admitting: Physical Therapy

## 2021-06-19 ENCOUNTER — Encounter: Payer: Self-pay | Admitting: Physical Therapy

## 2021-06-19 ENCOUNTER — Other Ambulatory Visit: Payer: Self-pay

## 2021-06-19 DIAGNOSIS — M62830 Muscle spasm of back: Secondary | ICD-10-CM

## 2021-06-19 DIAGNOSIS — G8929 Other chronic pain: Secondary | ICD-10-CM

## 2021-06-19 DIAGNOSIS — M545 Low back pain, unspecified: Secondary | ICD-10-CM | POA: Diagnosis not present

## 2021-06-19 NOTE — Therapy (Signed)
[facility] [address] [place], [place], [address] Phone: [phone]   Fax:  [phone]  Physical Therapy Treatment  Patient Details  Name: Samantha Becker MRN: [id number] Date of Birth: [birth year] Referring Provider (PT): Inda Coke   Encounter Date: 06/19/2021   PT End of Session - 06/19/21 1201     Visit Number 7    Number of Visits 12    Date for PT Re-Evaluation 07/05/21    Authorization Type UHC    PT Start Time 0805    PT Stop Time 0845    PT Time Calculation (min) 40 min    Activity Tolerance Patient tolerated treatment well    Behavior During Therapy WFL for tasks assessed/performed             Past Medical History:  Diagnosis Date   Arthritis    Blood type, Rh negative    Chicken pox    GERD (gastroesophageal reflux disease)    Leaking of urine    Low iron    Mumps     Past Surgical History:  Procedure Laterality Date   CESAREAN SECTION     4 cs   LAPAROSCOPY  03/04/2012   Procedure: LAPAROSCOPY OPERATIVE;  Surgeon: Eldred Manges, MD;  Location: [facility];  Service: Gynecology;  Laterality: N/A;  Peritoneum Biopsy   TUBAL LIGATION Bilateral     There were no vitals filed for this visit.   Subjective Assessment - 06/19/21 1154     Subjective Pt states much improved pain in the last week. Was able to go to yoga, barre, and walk, with only sight soreness after increased activity. She thinks she is doing very well. She requests today to be her last appt, due to insurance issue/bills that she is receiving.    Currently in Pain? No/denies    Pain Score 0-No pain                               [facility] Adult PT Treatment/Exercise - 06/19/21 0001       Lumbar Exercises: Stretches   Hip Flexor Stretch --    Hip Flexor Stretch Limitations --    Piriformis Stretch 3 reps;30 seconds    Piriformis Stretch Limitations seated      Lumbar Exercises: Aerobic   Recumbent Bike  L2 x 7  min;      Lumbar Exercises: Standing   Other Standing Lumbar Exercises Hip abd  x15 bil with YTB    Other Standing Lumbar Exercises lateral step ups x 15 on R;      Lumbar Exercises: Supine   Bridge with clamshell --    Bridge with [employer] Limitations --      Lumbar Exercises: Sidelying   Clam 15 reps    Hip Abduction 15 reps      Shoulder Exercises: Standing   External Rotation 15 reps    Theraband Level (Shoulder External Rotation) Level 2 (Red)    Row 20 reps    Theraband Level (Shoulder Row) Level 3 (Green)    Other Standing Exercises wall push ups x 20      Shoulder Exercises: Stretch   Corner Stretch 3 reps;30 seconds    Corner Stretch Limitations doorway    Other Shoulder Stretches --      Manual Therapy   Manual Therapy Joint mobilization    Joint Mobilization --    Soft tissue mobilization  TPR to R glute, glute med, min, piriformis;    Passive ROM --                     PT Education - 06/19/21 1200     Education Details updated HEP, discussed importance of continued strengthening.    Becker(s) Educated Patient    Methods Explanation;Demonstration;Tactile cues;Verbal cues;Handout    Comprehension Verbalized understanding;Returned demonstration;Verbal cues required;Tactile cues required;Need further instruction              PT Short Term Goals - 06/12/21 0807       PT SHORT TERM GOAL #1   Title Pt to be independent with initial HEP    Time 2    Period Weeks    Status Achieved    Target Date 06/07/21               PT Long Term Goals - 06/19/21 1207       PT LONG TERM GOAL #1   Title Pt to be independent with final HEP    Time 6    Period Weeks    Status Achieved      PT LONG TERM GOAL #2   Title Pt to report decreased pain in R SI with standing , bending, and walking activities    Time 6    Period Weeks    Status Partially Met      PT LONG TERM GOAL #3   Title Pt to demo decreased muscle tension and pain in  L UT region, to 0-2/10 to improve abilityfor lifting, carrying, and IADLs.    Time 6    Period Weeks    Status Partially Met      PT LONG TERM GOAL #4   Title Pt to demo improved strength and stability of core muscles, to be [facility], with improved control, and  minimal postural changes seen with stability exercises.    Time 6    Period Weeks    Status Achieved                   Plan - 06/19/21 1202     Clinical Impression Statement Pt making good improvements. She has much less tenderness with palpation to R hip, and much improved pain with activity. She has much improved ability for strengthening for hips and core. She continues to have mild soreness after increased activity in the last week or so, but is happy with her progress. She also has soreness in L shoulder blade and R shoulder (likley bursitis) that she will continue to benefit from strengthenig and HEP for. Discussed seeing Ortho if pain does not improve in hip or shoulder. Pt would benefit from continued care, and decreased frequency, but requests to have today be last visit.  DIscussed importance of continuing HEP for strengthening of hips. Will put pt on hold for a couple weeks, will call if she has increased pain or questions.    Personal Factors and Comorbidities Time since onset of injury/illness/exacerbation    Examination-Activity Limitations Lift;Stand;Bend;Transfers;Carry;Squat    Examination-Participation Restrictions Cleaning;Meal Prep;Yard Work;Community Activity;Driving;Shop    Stability/Clinical Decision Making Stable/Uncomplicated    Rehab Potential Good    PT Frequency 2x / week    PT Duration 6 weeks    PT Treatment/Interventions ADLs/Self Care Home Management;Cryotherapy;Scientist, product/process development;Iontophoresis 10m/ml Dexamethasone;Moist Heat;Traction;Ultrasound;Balance training;Therapeutic exercise;Therapeutic activities;Stair training;Gait training;Neuromuscular re-education;Patient/family  education;Orthotic Fit/Training;Manual techniques;Passive range of motion;Energy conservation;Taping;Vasopneumatic Device;Spinal Manipulations;Joint Manipulations    Consulted and Agree with  Plan of Care Patient             Patient will benefit from skilled therapeutic intervention in order to improve the following deficits and impairments:  Pain, Decreased mobility, Increased muscle spasms, Decreased strength, Decreased activity tolerance, Impaired flexibility  Visit Diagnosis: Chronic right-sided low back pain without sciatica  Muscle spasm of back     Problem List Patient Active Problem List   Diagnosis Date Noted   Atrophic vaginitis 03/20/2017   Endometriosis 03/18/2012   Dyspareunia 02/01/2012    Lyndee Hensen, PT, DPT 12:08 PM  06/19/21    [facility], [place], [address] Phone: [phone]   Fax:  [phone]  Name: QUIN MCPHERSON MRN: [id number] Date of Birth: [birth year]

## 2021-06-21 ENCOUNTER — Ambulatory Visit (INDEPENDENT_AMBULATORY_CARE_PROVIDER_SITE_OTHER): Payer: 59 | Admitting: Physical Therapy

## 2021-06-21 ENCOUNTER — Encounter: Payer: Self-pay | Admitting: Physical Therapy

## 2021-06-21 ENCOUNTER — Other Ambulatory Visit: Payer: Self-pay

## 2021-06-21 DIAGNOSIS — M545 Low back pain, unspecified: Secondary | ICD-10-CM | POA: Diagnosis not present

## 2021-06-21 DIAGNOSIS — M62830 Muscle spasm of back: Secondary | ICD-10-CM | POA: Diagnosis not present

## 2021-06-21 DIAGNOSIS — M25521 Pain in right elbow: Secondary | ICD-10-CM

## 2021-06-21 DIAGNOSIS — G8929 Other chronic pain: Secondary | ICD-10-CM

## 2021-06-21 NOTE — Therapy (Signed)
[facility] [address] [place], [place], [address] Phone: [phone]   Fax:  [phone]  Physical Therapy Treatment  Patient Details  Name: Samantha Becker MRN: [id number] Date of Birth: Apr 21, 1956 Referring Provider (PT): Inda Coke   Encounter Date: 06/21/2021   PT End of Session - 06/21/21 0916     Visit Number 8    Number of Visits 12    Date for PT Re-Evaluation 07/05/21    Authorization Type UHC    PT Start Time 0810    PT Stop Time 0848    PT Time Calculation (min) 38 min    Activity Tolerance Patient tolerated treatment well    Behavior During Therapy WFL for tasks assessed/performed             Past Medical History:  Diagnosis Date   Arthritis    Blood type, Rh negative    Chicken pox    GERD (gastroesophageal reflux disease)    Leaking of urine    Low iron    Mumps     Past Surgical History:  Procedure Laterality Date   CESAREAN SECTION     4 cs   LAPAROSCOPY  03/04/2012   Procedure: LAPAROSCOPY OPERATIVE;  Surgeon: Eldred Manges, MD;  Location: [facility];  Service: Gynecology;  Laterality: N/A;  Peritoneum Biopsy   TUBAL LIGATION Bilateral     There were no vitals filed for this visit.   Subjective Assessment - 06/21/21 0912     Subjective Pt states that she talked to insurance company, billing is correct, and she will be paying towards her deductable for her PT visits. Pt would like to continue Pt because she feels like she is improving. Was able to exercise this week, with slight pain in hip when she was done. R shoulder pain a bit better, with limiting overhead and lateral motions in exercise class.    Currently in Pain? Yes    Pain Score 2     Pain Location Hip    Pain Orientation Right                               [facility] Adult PT Treatment/Exercise - 06/21/21 0001       Lumbar Exercises: Stretches   Piriformis Stretch 3 reps;30 seconds    Piriformis  Stretch Limitations seated and supine      Lumbar Exercises: Aerobic   Recumbent Bike L2 x 7  min;      Lumbar Exercises: Standing   Functional Squats 20 reps    Functional Squats Limitations x20 no weight , x 5 with 10 lb, x 5 with 25 lb ;    Other Standing Lumbar Exercises Hip abd  x15 bil with YTB    Other Standing Lumbar Exercises walk/march fwd/bwd x 25 ft x 3 ea;      Lumbar Exercises: Sidelying   Clam 15 reps    Hip Abduction --      Shoulder Exercises: Supine   Other Supine Exercises AAROM 2 hand hold x 10 for HEP      Shoulder Exercises: Standing   External Rotation --    Theraband Level (Shoulder External Rotation) --    Row --    Theraband Level (Shoulder Row) --    Other Standing Exercises --      Shoulder Exercises: Archivist Limitations --  Manual Therapy   Manual Therapy Joint mobilization    Manual therapy comments skilled palpation and monitoring of soft tissue with dry needling    Joint Mobilization R shoulder post and inf mobs, LAD    Soft tissue mobilization TPR to R glute, glute med, min, piriformis;              Trigger Point Dry Needling - 06/21/21 0001     Consent Given? Yes    Education Handout Provided Previously provided    Muscles Treated Back/Hip Gluteus minimus;Gluteus medius;Piriformis    Gluteus Minimus Response Palpable increased muscle length   R   Gluteus Medius Response Palpable increased muscle length   R   Piriformis Response Palpable increased muscle length   R                  PT Education - 06/21/21 0916     Education Details Reviewed HEP    Person(s) Educated Patient    Methods Explanation;Demonstration;Tactile cues;Verbal cues    Comprehension Verbalized understanding;Returned demonstration;Verbal cues required;Tactile cues required              PT Short Term Goals - 06/12/21 0807       PT SHORT TERM GOAL #1   Title Pt to be independent with initial HEP     Time 2    Period Weeks    Status Achieved    Target Date 06/07/21               PT Long Term Goals - 06/19/21 1207       PT LONG TERM GOAL #1   Title Pt to be independent with final HEP    Time 6    Period Weeks    Status Achieved      PT LONG TERM GOAL #2   Title Pt to report decreased pain in R SI with standing , bending, and walking activities    Time 6    Period Weeks    Status Partially Met      PT LONG TERM GOAL #3   Title Pt to demo decreased muscle tension and pain in L UT region, to 0-2/10 to improve abilityfor lifting, carrying, and IADLs.    Time 6    Period Weeks    Status Partially Met      PT LONG TERM GOAL #4   Title Pt to demo improved strength and stability of core muscles, to be [facility], with improved control, and  minimal postural changes seen with stability exercises.    Time 6    Period Weeks    Status Achieved                   Plan - 06/21/21 4332     Clinical Impression Statement Pt returns to PT today, requesting to continue, she resolved her insurance issue, and feels that she is improving. She has been able to do well with physical activity and exercise, does continue to have mild soreness after increased activity, but is much improved from previous weeks. She does have tightness and soreness with deep palpation today, addressed with DTM/TPR and dry needling today. Added sqauts for strengthening today, and discussed mechanics (for exercise class). Pt with tendency for anterior pelvic tilt, cued for increasing core stability with squats. Pt to benefit from continued care for strengthening and pain relief.    Personal Factors and Comorbidities Time since onset of injury/illness/exacerbation    Examination-Activity Limitations Lift;Stand;Bend;Transfers;Carry;Squat  Examination-Participation Restrictions Cleaning;Meal Prep;Yard Work;Community Activity;Driving;Shop    Stability/Clinical Decision Making Stable/Uncomplicated    Rehab  Potential Good    PT Frequency 2x / week    PT Duration 6 weeks    PT Treatment/Interventions ADLs/Self Care Home Management;Cryotherapy;Scientist, product/process development;Iontophoresis 61m/ml Dexamethasone;Moist Heat;Traction;Ultrasound;Balance training;Therapeutic exercise;Therapeutic activities;Stair training;Gait training;Neuromuscular re-education;Patient/family education;Orthotic Fit/Training;Manual techniques;Passive range of motion;Energy conservation;Taping;Vasopneumatic Device;Spinal Manipulations;Joint Manipulations    Consulted and Agree with Plan of Care Patient             Patient will benefit from skilled therapeutic intervention in order to improve the following deficits and impairments:  Pain, Decreased mobility, Increased muscle spasms, Decreased strength, Decreased activity tolerance, Impaired flexibility  Visit Diagnosis: Chronic right-sided low back pain without sciatica  Muscle spasm of back  Pain in right elbow     Problem List Patient Active Problem List   Diagnosis Date Noted   Atrophic vaginitis 03/20/2017   Endometriosis 03/18/2012   Dyspareunia 02/01/2012   LLyndee Becker PT, DPT 9:27 AM  06/21/21   [place] [address]Phone: [phone]  Fax:  [phone] Name: Samantha GARRODMRN: [id number] of Birth: 6Jul 25, 1957

## 2021-06-26 ENCOUNTER — Encounter: Payer: Self-pay | Admitting: Physical Therapy

## 2021-06-26 ENCOUNTER — Other Ambulatory Visit: Payer: Self-pay

## 2021-06-26 ENCOUNTER — Ambulatory Visit (INDEPENDENT_AMBULATORY_CARE_PROVIDER_SITE_OTHER): Payer: 59 | Admitting: Physical Therapy

## 2021-06-26 DIAGNOSIS — M545 Low back pain, unspecified: Secondary | ICD-10-CM

## 2021-06-26 DIAGNOSIS — M62830 Muscle spasm of back: Secondary | ICD-10-CM | POA: Diagnosis not present

## 2021-06-26 DIAGNOSIS — M25521 Pain in right elbow: Secondary | ICD-10-CM

## 2021-06-26 DIAGNOSIS — G8929 Other chronic pain: Secondary | ICD-10-CM

## 2021-06-26 NOTE — Therapy (Addendum)
[facility] [address] [place], [place], [address] Phone: [phone]   Fax:  [phone]  Physical Therapy Treatment  Patient Details  Name: Samantha Becker MRN: [id number] Date of Birth: August 04, 1956 Referring Provider (PT): Inda Coke   Encounter Date: 06/26/2021   PT End of Session - 06/26/21 0816     Visit Number 9    Number of Visits 12    Date for PT Re-Evaluation 07/05/21    Authorization Type UHC    PT Start Time 0806    PT Stop Time 0845    PT Time Calculation (min) 39 min    Activity Tolerance Patient tolerated treatment well    Behavior During Therapy WFL for tasks assessed/performed             Past Medical History:  Diagnosis Date   Arthritis    Blood type, Rh negative    Chicken pox    GERD (gastroesophageal reflux disease)    Leaking of urine    Low iron    Mumps     Past Surgical History:  Procedure Laterality Date   CESAREAN SECTION     4 cs   LAPAROSCOPY  03/04/2012   Procedure: LAPAROSCOPY OPERATIVE;  Surgeon: Eldred Manges, MD;  Location: [facility];  Service: Gynecology;  Laterality: N/A;  Peritoneum Biopsy   TUBAL LIGATION Bilateral     There were no vitals filed for this visit.   Subjective Assessment - 06/26/21 0815     Subjective Pt has been able to do more activity, did go hiking. Thinks she is doing much better overall with pain levels and ability for exercise and activity. Still states up to 5/10 pain however, daily.    Currently in Pain? Yes    Pain Score 5     Pain Location Hip    Pain Orientation Right    Pain Descriptors / Indicators Aching    Pain Type Chronic pain    Pain Onset More than a month ago    Pain Frequency Intermittent                               [facility] Adult PT Treatment/Exercise - 06/26/21 0001       Lumbar Exercises: Stretches   Piriformis Stretch 3 reps;30 seconds    Piriformis Stretch Limitations seated and supine       Lumbar Exercises: Aerobic   Recumbent Bike L2 x 8  min;      Lumbar Exercises: Standing   Functional Squats 20 reps    Functional Squats Limitations x20 with 20 lb;    Other Standing Lumbar Exercises Hip abd  2x15 bil    Other Standing Lumbar Exercises walk/march fwd/bwd x 25 ft x 3 ea;      Lumbar Exercises: Sidelying   Clam --    Hip Abduction 15 reps      Shoulder Exercises: Supine   Other Supine Exercises --      Manual Therapy   Manual Therapy Joint mobilization    Manual therapy comments --    Joint Mobilization lumbar PA mobs , SI mobs    Soft tissue mobilization TPR to R glute, glute med, min, piriformis;    Manual Traction long leg distraction on R for lumbar pump;                     PT Education - 06/26/21 1438  Education Details Reviewed/updated HEP    Person(s) Educated Patient    Methods Demonstration;Explanation;Tactile cues;Verbal cues;Handout    Comprehension Verbalized understanding;Returned demonstration;Verbal cues required;Tactile cues required              PT Short Term Goals - 06/12/21 0807       PT SHORT TERM GOAL #1   Title Pt to be independent with initial HEP    Time 2    Period Weeks    Status Achieved    Target Date 06/07/21               PT Long Term Goals - 06/19/21 1207       PT LONG TERM GOAL #1   Title Pt to be independent with final HEP    Time 6    Period Weeks    Status Achieved      PT LONG TERM GOAL #2   Title Pt to report decreased pain in R SI with standing , bending, and walking activities    Time 6    Period Weeks    Status Partially Met      PT LONG TERM GOAL #3   Title Pt to demo decreased muscle tension and pain in L UT region, to 0-2/10 to improve abilityfor lifting, carrying, and IADLs.    Time 6    Period Weeks    Status Partially Met      PT LONG TERM GOAL #4   Title Pt to demo improved strength and stability of core muscles, to be [facility], with improved control, and  minimal  postural changes seen with stability exercises.    Time 6    Period Weeks    Status Achieved                   Plan - 06/26/21 1439     Clinical Impression Statement Pt has been seen for 9 visits. She reports doing much better overall, with less pain, but also states 5/10 pain daily. She has much less tenderness to palpate in R glute. She does have mild stiffness in hip joint, likely from hip OA. She does have some deep glute pain that may be stemming from lumbar spine. Pt doing very well with HEP and exercise at this time. Did recommend that she get appt with sports med, due to ongoing nature of pain for futher assessment. Discussed importance of continuing strengthening for hip and back. Will hold PT at this time, pt may require continued care in future, based on MD recommendations or if pain does not continue to improve with further strengthening. pt in agreement with plan.    Personal Factors and Comorbidities Time since onset of injury/illness/exacerbation    Examination-Activity Limitations Lift;Stand;Bend;Transfers;Carry;Squat    Examination-Participation Restrictions Cleaning;Meal Prep;Yard Work;Community Activity;Driving;Shop    Stability/Clinical Decision Making Stable/Uncomplicated    Rehab Potential Good    PT Frequency 2x / week    PT Duration 6 weeks    PT Treatment/Interventions ADLs/Self Care Home Management;Cryotherapy;Scientist, product/process development;Iontophoresis 72m/ml Dexamethasone;Moist Heat;Traction;Ultrasound;Balance training;Therapeutic exercise;Therapeutic activities;Stair training;Gait training;Neuromuscular re-education;Patient/family education;Orthotic Fit/Training;Manual techniques;Passive range of motion;Energy conservation;Taping;Vasopneumatic Device;Spinal Manipulations;Joint Manipulations    Consulted and Agree with Plan of Care Patient             Patient will benefit from skilled therapeutic intervention in order to improve the following  deficits and impairments:  Pain, Decreased mobility, Increased muscle spasms, Decreased strength, Decreased activity tolerance, Impaired flexibility  Visit Diagnosis: Chronic right-sided low back pain  without sciatica  Muscle spasm of back  Pain in right elbow     Problem List Patient Active Problem List   Diagnosis Date Noted   Atrophic vaginitis 03/20/2017   Endometriosis 03/18/2012   Dyspareunia 02/01/2012    Lyndee Hensen, PT, DPT 2:45 PM  06/26/21    [facility] [place], [place], [address] Phone: [phone]   Fax:  [phone]  Name: Samantha Becker MRN: [id number] Date of Birth: 12-05-1955  PHYSICAL THERAPY DISCHARGE SUMMARY  Visits from Start of Care: 9 Plan: Patient agrees to discharge.  Patient goals were partially met. Patient is being discharged due to - recommended referral to sports med due to ongoing pain.     Lyndee Hensen, PT, DPT 12:31 PM  08/30/21

## 2021-07-17 NOTE — Progress Notes (Signed)
Subjective:    CC: R shoulder pain  I, Samantha Becker, Samantha Becker, Samantha Becker, am serving as scribe for Dr. Lynne Becker.  HPI: Pt is a 65 y/o female presenting w/ c/o R shoulder pain x 8 weeks w/ no known MOI.  She locates her pain to her R superior shoulder.  She recently completed PT at [facility] for LBP.  She does note some radiating pain and paresthesias to her right volar forearm but not most of the hand.  She notes her main source of tenderness to her right shoulder is at the [facility] joint area.  Radiating pain: No Neck pain: yes, R-sided neck pain R shoulder mechanical symptoms: intermittently yes R UE numbness/tingling: yes into the R UE  Aggravating factors: opening/closing her sliding glass door at home; laying on her R side Treatments tried: HEP per PT; Advil; Voltaren gel  Pertinent review of Systems: No fevers or chills  Relevant historical information: Endometriosis   Objective:    Vitals:   07/18/21 0854  BP: 134/82  Pulse: 71  SpO2: 99%   General: Well Developed, well nourished, and in no acute distress.   MSK: C-spine: Normal-appearing Nontender. Normal cervical motion. Positive right-sided Spurling's test. Number extremity strength is intact. Reflexes and sensation are intact.   Right shoulder: Normal-appearing Normal motion. Tender palpation AC joint. Intact strength in abduction external and internal rotation. Negative Hawkins and Neer's test. Negative empty can test. Positive crossover arm compression test.  Lab and Radiology Results  Procedure: Real-time Ultrasound Guided Injection of right shoulder AC joint superior approach Device: Philips Affiniti 50G Images permanently stored and available for review in PACS Ultrasound evaluation prior to injection reveals intact rotator cuff tendons with mild subacromial bursitis.  Degenerative AC joint with effusion. Verbal informed consent obtained.  Discussed risks and benefits of procedure. Warned about infection  bleeding damage to structures skin hypopigmentation and fat atrophy among others. Patient expresses understanding and agreement Time-out conducted.   Noted no overlying erythema, induration, or other signs of local infection.   Skin prepped in a sterile fashion.   Local anesthesia: Topical Ethyl chloride.   With sterile technique and under real time ultrasound guidance: 40 mg of Kenalog and 1 mL of Marcaine injected into AC joint. Fluid seen entering the joint capsule.   Completed without difficulty   Pain immediately resolved suggesting accurate placement of the medication.   Advised to call if fevers/chills, erythema, induration, drainage, or persistent bleeding.   Images permanently stored and available for review in the ultrasound unit.  Impression: Technically successful ultrasound guided injection.   X-ray images right shoulder and C-spine obtained today personally and independently interpreted  C-spine: DDD worst at C5-6.  No acute fractures.  Right shoulder: Mild AC DJD.  Normal glenohumeral DJD.  No acute fractures.  Await formal radiology review    Impression and Recommendations:    Assessment and Plan: 65 y.o. female with right shoulder pain thought to be due to [facility] DJD and joint effusion.  Patient had good response to injection of AC joint in clinic today.  If not improving would consider MRI.  She has been receiving physical therapy since September 28.  She also likely has a C5 radiculopathy right arm.  Plan for trial of gabapentin.  She has been receiving conservative management for this with a chiropractor and with physical therapy since September 28.  If not improving will proceed to MRI.Marland Kitchen  Check back in about 6 weeks.  PDMP not reviewed this encounter.  Orders Placed This Encounter  Procedures   [place] LIMITED JOINT SPACE STRUCTURES UP RIGHT(NO LINKED CHARGES)    Order Specific Question:   Reason for Exam (SYMPTOM  OR DIAGNOSIS REQUIRED)    Answer:   R shoulder pain     Order Specific Question:   Preferred imaging location?    Answer:   [place]   DG Shoulder Right    Standing Status:   Future    Number of Occurrences:   1    Standing Expiration Date:   08/17/2021    Order Specific Question:   Reason for Exam (SYMPTOM  OR DIAGNOSIS REQUIRED)    Answer:   R shoulder pain    Order Specific Question:   Preferred imaging location?    Answer:   [facility]   DG Cervical Spine 2 or 3 views    Standing Status:   Future    Number of Occurrences:   1    Standing Expiration Date:   08/17/2021    Order Specific Question:   Reason for Exam (SYMPTOM  OR DIAGNOSIS REQUIRED)    Answer:   neck pain    Order Specific Question:   Preferred imaging location?    Answer:   [facility]   Meds ordered this encounter  Medications   gabapentin (NEURONTIN) 300 MG capsule    Sig: Take 1 capsule (300 mg total) by mouth 3 (three) times daily as needed.    Dispense:  90 capsule    Refill:  2    Discussed warning signs or symptoms. Please see discharge instructions. Patient expresses understanding.   The above documentation has been reviewed and is accurate and complete Samantha Becker, M.D.

## 2021-07-18 ENCOUNTER — Other Ambulatory Visit: Payer: Self-pay

## 2021-07-18 ENCOUNTER — Ambulatory Visit: Payer: 59

## 2021-07-18 ENCOUNTER — Ambulatory Visit (INDEPENDENT_AMBULATORY_CARE_PROVIDER_SITE_OTHER): Payer: 59

## 2021-07-18 ENCOUNTER — Encounter: Payer: Self-pay | Admitting: Family Medicine

## 2021-07-18 ENCOUNTER — Ambulatory Visit (INDEPENDENT_AMBULATORY_CARE_PROVIDER_SITE_OTHER): Payer: 59 | Admitting: Family Medicine

## 2021-07-18 ENCOUNTER — Ambulatory Visit: Payer: Self-pay

## 2021-07-18 VITALS — BP 134/82 | HR 71 | Ht 62.0 in | Wt 129.4 lb

## 2021-07-18 DIAGNOSIS — M19011 Primary osteoarthritis, right shoulder: Secondary | ICD-10-CM | POA: Diagnosis not present

## 2021-07-18 DIAGNOSIS — M542 Cervicalgia: Secondary | ICD-10-CM | POA: Diagnosis not present

## 2021-07-18 DIAGNOSIS — M25511 Pain in right shoulder: Secondary | ICD-10-CM

## 2021-07-18 DIAGNOSIS — M5412 Radiculopathy, cervical region: Secondary | ICD-10-CM

## 2021-07-18 MED ORDER — GABAPENTIN 300 MG PO CAPS: 300.0000 mg | ORAL_CAPSULE | Freq: Three times a day (TID) | ORAL | 2 refills | Status: DC | PRN

## 2021-07-18 NOTE — Progress Notes (Signed)
Right shoulder x-ray shows no fracture or significant arthritis.  Normal-appearing x-ray per radiology.

## 2021-07-18 NOTE — Patient Instructions (Addendum)
Nice to meet you today.  You had a R AC joint injection.  Call or go to the ER if you develop a large red swollen joint with extreme pain or oozing puss.   I've prescribed Gabapentin for you to take at bedtime but can take up to 3x daily as needed.  Please get an Xray today before you leave.  Follow-up: 6 weeks

## 2021-07-18 NOTE — Progress Notes (Signed)
X-ray cervical spine shows some arthritis changes

## 2021-08-29 ENCOUNTER — Ambulatory Visit: Payer: 59 | Admitting: Family Medicine

## 2022-01-30 ENCOUNTER — Ambulatory Visit: Payer: Self-pay

## 2022-01-30 ENCOUNTER — Ambulatory Visit (INDEPENDENT_AMBULATORY_CARE_PROVIDER_SITE_OTHER): Payer: 59 | Admitting: Family Medicine

## 2022-01-30 ENCOUNTER — Encounter: Payer: Self-pay | Admitting: Family Medicine

## 2022-01-30 VITALS — BP 130/80 | HR 73 | Ht 62.0 in | Wt 132.2 lb

## 2022-01-30 DIAGNOSIS — M25551 Pain in right hip: Secondary | ICD-10-CM

## 2022-01-30 DIAGNOSIS — M19011 Primary osteoarthritis, right shoulder: Secondary | ICD-10-CM | POA: Diagnosis not present

## 2022-01-30 DIAGNOSIS — G8929 Other chronic pain: Secondary | ICD-10-CM

## 2022-01-30 DIAGNOSIS — M25511 Pain in right shoulder: Secondary | ICD-10-CM | POA: Diagnosis not present

## 2022-01-30 NOTE — Patient Instructions (Addendum)
Good to see you today.  I've referred you for a R shoulder MRI.  That facility will call you to schedule but please let us know if you don't hear from [place] in one week regarding scheduling.  I've referred you to PT for your R hip.  Their office will call you to schedule but please let us know if you don't hear from them in one week regarding scheduling.  Follow-up: after MRI  Extracorporeal Shockwave Therapy

## 2022-01-30 NOTE — Progress Notes (Signed)
I, Samantha Becker, LAT, ATC, am serving as scribe for Dr. Lynne Leader.  Samantha Becker is a 66 y.o. female who presents to [place] at [facility] today for continued R shoulder pain thought to be due to [facility] DJD and joint effusion. Pt was last seen by Dr. Georgina Snell on 07/18/21 and was given a R subacromial steroid injection and prescribed gabapentin for the likely C5 radiculopathy. Pt completed a prior course of PT, ending on 06/26/21. Today, pt reports that her R shoulder pain started coming back in January and has been getting progressively worse.  She also reports some recurrence of prior R GT bursitis/tendinopathy.  Dx imaging: 07/18/21 C-spine & R shoulder XR  Pertinent review of systems: No fevers or chills  Relevant historical information: Right-sided Mnire's disease.   Exam:  BP 130/80 (BP Location: Right Arm, Patient Position: Sitting, Cuff Size: Normal)   Pulse 73   Ht '5\' 2"'$  (1.575 m)   Wt 132 lb 3.2 oz (60 kg)   LMP  (LMP Unknown)   SpO2 96%   BMI 24.18 kg/m  General: Well Developed, well nourished, and in no acute distress.   MSK: Right shoulder: Tender palpation AC joint. Normal motion pain with crossover arm compression. Intact strength.  Right hip: Normal. Tender palpation along the course of the gluteus medius and minimus tendons. Normal hip motion. Hip abduction and external rotation strength are intact but painful.     Lab and Radiology Results  EXAM: RIGHT SHOULDER - 2+ VIEW   COMPARISON:  None.   FINDINGS: There is no evidence of fracture or dislocation. There is no evidence of arthropathy or other focal bone abnormality. Soft tissues are unremarkable.   IMPRESSION: No fracture or dislocation is seen. No abnormal soft tissue calcifications are seen.     Electronically Signed   By: Elmer Picker M.D.   On: 07/18/2021 14:39  I, Lynne Leader, personally (independently) visualized and performed the interpretation of  the images attached in this note.      Assessment and Plan: 66 y.o. female with right shoulder pain.  Pain is thought to be primarily originating from the [facility] joint.  She has had a successful AC joint injection in January that lasted about 2 or 3 months but the pain has swiftly returned.  X-ray obtained in November did not show the suspected source of pain very well.  After discussion plan for MRI to further evaluate source of pain and for treatment planning such as potential surgical planning or even repeat injection planning.  Recheck after MRI.  As for her lateral hip pain she has done well in the past with trial of PT including dry needling.  We discussed repeat PT for trial.  However likely will proceed to trial of extracorporeal shockwave therapy if PT is not successful enough.   PDMP not reviewed this encounter. Orders Placed This Encounter  Procedures   [place] LIMITED JOINT SPACE STRUCTURES UP RIGHT(NO LINKED CHARGES)    Order Specific Question:   Reason for Exam (SYMPTOM  OR DIAGNOSIS REQUIRED)    Answer:   R shoulder pain    Order Specific Question:   Preferred imaging location?    Answer:   [place]   MR SHOULDER RIGHT WO CONTRAST    Standing Status:   Future    Standing Expiration Date:   01/31/2023    Order Specific Question:   What is the patient's sedation requirement?    Answer:  No Sedation    Order Specific Question:   Does the patient have a pacemaker or implanted devices?    Answer:   No    Order Specific Question:   Preferred imaging location?    Answer:   [facility] development [facility] (table limit-350lbs)   Ambulatory referral to Physical Therapy    Referral Priority:   Routine    Referral Type:   Physical Medicine    Referral Reason:   Specialty Services Required    Requested Specialty:   Physical Therapy    Number of Visits Requested:   1   No orders of the defined types were placed in this encounter.    Discussed warning signs or  symptoms. Please see discharge instructions. Patient expresses understanding.   The above documentation has been reviewed and is accurate and complete Lynne Leader, M.D.

## 2022-02-03 ENCOUNTER — Ambulatory Visit (INDEPENDENT_AMBULATORY_CARE_PROVIDER_SITE_OTHER): Payer: 59

## 2022-02-03 DIAGNOSIS — M25511 Pain in right shoulder: Secondary | ICD-10-CM

## 2022-02-03 DIAGNOSIS — M19011 Primary osteoarthritis, right shoulder: Secondary | ICD-10-CM

## 2022-02-03 DIAGNOSIS — G8929 Other chronic pain: Secondary | ICD-10-CM | POA: Diagnosis not present

## 2022-02-07 NOTE — Progress Notes (Signed)
Shoulder MRI shows rotator cuff tendinitis and bursitis.  This certainly could be a source of pain that we have not tried to treat.  You also have mild to medium main shoulder joint arthritis which we could treat as well.  Lastly you have medium AC joint arthritis which we have been doing injections for.  Recommend returning to clinic to proceed with potential injection in a different spot and to discuss the results of the MRI in full detail.

## 2022-02-13 ENCOUNTER — Ambulatory Visit (INDEPENDENT_AMBULATORY_CARE_PROVIDER_SITE_OTHER): Payer: 59 | Admitting: Family Medicine

## 2022-02-13 ENCOUNTER — Ambulatory Visit: Payer: Self-pay

## 2022-02-13 VITALS — BP 158/92 | HR 66 | Ht 62.0 in | Wt 135.2 lb

## 2022-02-13 DIAGNOSIS — M25511 Pain in right shoulder: Secondary | ICD-10-CM | POA: Diagnosis not present

## 2022-02-13 DIAGNOSIS — G8929 Other chronic pain: Secondary | ICD-10-CM

## 2022-02-13 NOTE — Progress Notes (Signed)
I, Samantha Becker, LAT, ATC acting as a scribe for Samantha Leader, MD.  Samantha Becker is a 66 y.o. female who presents to [place] at [facility] today for f/u R shoulder and R lateral hip pain. Pt completed prior course of PT, ending on 06/26/21 and her last R subacromial steroid injection was on 07/18/21. Pt was last seen by Dr. Georgina Becker on 01/30/22 and a R shoulder MRI was ordered and pt was referred to PT, and has her first visit scheduled for 6/22. Today, pt reports R shoulder pain has worsened. Pt notes R lateral hip pain remains the same.  Majority of the pain is located at the superior shoulder.  She also notes pain with internal rotation and abduction.  She is willing to consider surgery.  Dx imaging: 02/03/22 R shoulder MRI 07/18/21 C-spine & R shoulder XR  Pertinent review of systems: No fevers or chills  Relevant historical information: Mnire's disease right ear   Exam:  BP (!) 158/92   Pulse 66   Ht '5\' 2"'$  (1.575 m)   Wt 135 lb 3.2 oz (61.3 kg)   LMP  (LMP Unknown)   SpO2 96%   BMI 24.73 kg/m  General: Well Developed, well nourished, and in no acute distress.   MSK: Shoulder: Normal. Tender palpation AC joint Pain with abduction and internal rotation.    Lab and Radiology Results  EXAM: MRI OF THE RIGHT SHOULDER WITHOUT CONTRAST   TECHNIQUE: Multiplanar, multisequence MR imaging of the shoulder was performed. No intravenous contrast was administered.   COMPARISON:  Radiograph 07/18/2021   FINDINGS: Rotator cuff: There is mild distal supraspinatus and infraspinatus tendinosis with bursal sided fraying. Teres minor tendon is intact. Subscapularis tendon is intact.   Muscles: No significant muscle atrophy.   Biceps Long Head: Intraarticular and extraarticular portions of the biceps tendon are intact.   Acromioclavicular Joint: Moderate arthropathy of the acromioclavicular joint. Minimal subacromial/subdeltoid bursal fluid.    Glenohumeral Joint: No significant joint effusion. Mild to moderate chondrosis.   Labrum: Grossly intact, but evaluation is limited by lack of intraarticular fluid/contrast.   Bones: No fracture or dislocation. No aggressive osseous lesion.   Other: No fluid collection or hematoma.   IMPRESSION: Mild distal supraspinatus and infraspinatus tendinosis with bursal sided fraying. No high-grade or retracted cuff tear. No muscle atrophy.   Minimal mild subacromial-subdeltoid bursitis.   Mild-to-moderate mild glenohumeral osteoarthritis.   Moderate AC joint arthropathy.     Electronically Signed   By: Maurine Simmering M.D.   On: 02/06/2022 08:47 I, Samantha Becker, personally (independently) visualized and performed the interpretation of the images attached in this note.     Assessment and Plan: 66 y.o. female with right shoulder pain.  The dominant source of pain is probably the [facility] joint DJD seen on MRI.  She does have some rotator cuff tendinitis and bursitis as a source of pain as well.  She does have some glenohumeral DJD that may be the source of pain but this is not consistent with the physical exam is much.  She has had several injections and home exercise program.  I am not optimistic that physical therapy will help all that much here.  She would like to consider surgery.  I do think a distal clavicle excision and subacromial decompression could be helpful.  Plan to refer to orthopedic surgery to discuss surgical options.  Check back with me as needed.   PDMP not reviewed this encounter. Orders Placed This  Encounter  Procedures   [place] LIMITED JOINT SPACE STRUCTURES UP RIGHT(NO LINKED CHARGES)    Order Specific Question:   Reason for Exam (SYMPTOM  OR DIAGNOSIS REQUIRED)    Answer:   right shoulder pain    Order Specific Question:   Preferred imaging location?    Answer:   [place]   Ambulatory referral to Orthopedic Surgery    Referral Priority:   Routine     Referral Type:   Surgical    Referral Reason:   Specialty Services Required    Referred to Provider:   Vanetta Mulders, MD    Requested Specialty:   Orthopedic Surgery    Number of Visits Requested:   1   No orders of the defined types were placed in this encounter.    Discussed warning signs or symptoms. Please see discharge instructions. Patient expresses understanding.   The above documentation has been reviewed and is accurate and complete Samantha Becker, M.D.

## 2022-02-13 NOTE — Patient Instructions (Addendum)
Thank you for coming in today.   I've referred you for a surgical consultation.  Let us know if you don't hear from them in one week.   Check back with me as needed

## 2022-02-14 ENCOUNTER — Ambulatory Visit (HOSPITAL_BASED_OUTPATIENT_CLINIC_OR_DEPARTMENT_OTHER): Payer: Self-pay | Admitting: Orthopaedic Surgery

## 2022-02-14 ENCOUNTER — Ambulatory Visit (INDEPENDENT_AMBULATORY_CARE_PROVIDER_SITE_OTHER): Payer: 59 | Admitting: Orthopaedic Surgery

## 2022-02-14 DIAGNOSIS — M19019 Primary osteoarthritis, unspecified shoulder: Secondary | ICD-10-CM

## 2022-02-14 MED ORDER — OXYCODONE HCL 5 MG PO CAPS: 5.0000 mg | ORAL_CAPSULE | ORAL | 0 refills | Status: DC | PRN

## 2022-02-14 MED ORDER — ACETAMINOPHEN 500 MG PO TABS: 500.0000 mg | ORAL_TABLET | Freq: Three times a day (TID) | ORAL | 0 refills | Status: AC

## 2022-02-14 MED ORDER — ASPIRIN 325 MG PO TBEC: 325.0000 mg | DELAYED_RELEASE_TABLET | Freq: Every day | ORAL | 0 refills | Status: DC

## 2022-02-14 MED ORDER — IBUPROFEN 800 MG PO TABS: 800.0000 mg | ORAL_TABLET | Freq: Three times a day (TID) | ORAL | 0 refills | Status: AC

## 2022-02-14 NOTE — Progress Notes (Signed)
Chief Complaint: Right shoulder pain     History of Present Illness:    Samantha Becker is a 66 y.o. female right-hand-dominant female presents with ongoing right AC pain for many years now.  She has previously undergone physical therapy for many months in the right shoulder which did temporarily relieve her pain although this has been back in aggravating her since January of this year.  She has seen Dr. Georgina Snell in the past who provided a ultrasound-guided injection of the right [facility] joint.  This provided 6 weeks of excellent relief from the pain although this is subsequently required.  She has majority of her pain with cross body motions and she does experience some popping and clicking in the [facility] joint.  She enjoys gardening in her spare time although this has been limited by her shoulder pain.  She is here today for further discussion and treatment.    Surgical History:   None  PMH/PSH/Family History/Social History/Meds/Allergies:    Past Medical History:  Diagnosis Date   Arthritis    Blood type, Rh negative    Chicken pox    GERD (gastroesophageal reflux disease)    Leaking of urine    Low iron    Mumps    Past Surgical History:  Procedure Laterality Date   CESAREAN SECTION     4 cs   LAPAROSCOPY  03/04/2012   Procedure: LAPAROSCOPY OPERATIVE;  Surgeon: Eldred Manges, MD;  Location: [facility];  Service: Gynecology;  Laterality: N/A;  Peritoneum Biopsy   TUBAL LIGATION Bilateral    Social History   Socioeconomic History   Marital status: Married    Spouse name: Not on file   Number of children: Not on file   Years of education: Not on file   Highest education level: Not on file  Occupational History   Occupation: TEACHER     Employer: [employer]  Tobacco Use   Smoking status: Never   Smokeless tobacco: Never  Substance and Sexual Activity   Alcohol use: Yes    Alcohol/week: 4.0 standard drinks of alcohol    Types: 2  Glasses of wine, 2 Cans of beer per week   Drug use: No   Sexual activity: Yes  Other Topics Concern   Not on file  Social History Narrative   Prior Pharmacist, hospital at [employer], 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   Social Determinants of Health   Financial Resource Strain: Not on file  Food Insecurity: Not on file  Transportation Needs: Not on file  Physical Activity: Not on file  Stress: Not on file  Social Connections: Not on file   Family History  Problem Relation Age of Onset   Heart disease Father    Hypertension Father    Cancer Father    Stroke Father    Heart disease Mother    Hypertension Mother    Arthritis Mother    Breast cancer Mother    Osteoporosis Mother    Arthritis Sister    Asthma Sister    No Known Allergies Current Outpatient Medications  Medication Sig Dispense Refill   aspirin EC 81 MG tablet Take 1 tablet (81 mg total) by mouth daily. 30 tablet 3   conjugated estrogens (PREMARIN) vaginal cream Premarin 0.625 mg/gram vaginal cream  Insert 0.5 applicatorsful twice a week by  vaginal route. 42.5 g 2   diclofenac Sodium (VOLTAREN) 1 % GEL Apply 2 g topically as needed.     fluticasone (FLONASE) 50 MCG/ACT nasal spray Place 1 spray into both nostrils as needed for allergies or rhinitis.     No current facility-administered medications for this visit.   No results found.  Review of Systems:   A ROS was performed including pertinent positives and negatives as documented in the HPI.  Physical Exam :   Constitutional: NAD and appears stated age Neurological: Alert and oriented Psych: Appropriate affect and cooperative There were no vitals taken for this visit.   Comprehensive Musculoskeletal Exam:    Musculoskeletal Exam    Inspection Right Left  Skin No atrophy or winging No atrophy or winging  Palpation    Tenderness Right AC joint None  Range of Motion    Flexion (passive) 170 170  Flexion (active) 170 170  Abduction 170 170  ER at the side 70 70  Can reach  behind back to T12 T12  Strength     Full Full  Special Tests    Pseudoparalytic No No  Neurologic    Fires PIN, radial, median, ulnar, musculocutaneous, axillary, suprascapular, long thoracic, and spinal accessory innervated muscles. No abnormal sensibility  Vascular/Lymphatic    Radial Pulse 2+ 2+  Cervical Exam    Patient has symmetric cervical range of motion with negative Spurling's test.  Special Test: Positive crepitus about the [facility] joint with cross body abduction     Imaging:   Xray (right shoulder 3 views): Significant AC joint osteoarthritis with otherwise normal-appearing shoulder  MRI (right shoulder): Significant AC joint osteoarthritis  I personally reviewed and interpreted the radiographs.   Assessment:   65 y.o. female right-hand-dominant with symptomatic right AC joint osteoarthritis which has not been bothering her for several years.  She is at this time failed a trial of physical therapy and has also had an injection into the right [facility] joint which provided 6 weeks of very good relief.  That being said this has not recurred and she is wishing to discuss possible options.  I did describe that while another injection could be helpful these typically last for shorter duration of time with subsequent injections.  That effect we did discuss arthroscopic surgery for distal clavicle resection.  She understands this and would like a more permanent solution as she is continuing to experience pain with essentially most overhead activities or anything that requires cross body motion.  After discussion of risks and benefits she has elected for right shoulder arthroscopy with distal clavicle resection.  Plan :    -Plan for right shoulder arthroscopy with distal clavicle resection   After a lengthy discussion of treatment options, including risks, benefits, alternatives, complications of surgical and nonsurgical conservative options, the patient elected surgical repair.   The  patient  is aware of the material risks  and complications including, but not limited to injury to adjacent structures, neurovascular injury, infection, numbness, bleeding, implant failure, thermal burns, stiffness, persistent pain, failure to heal, disease transmission from allograft, need for further surgery, dislocation, anesthetic risks, blood clots, risks of death,and others. The probabilities of surgical success and failure discussed with patient given their particular co-morbidities.The time and nature of expected rehabilitation and recovery was discussed.The patient's questions were all answered preoperatively.  No barriers to understanding were noted. I explained the natural history of the disease process and Rx rationale.  I explained to the patient what I considered  to be reasonable expectations given their personal situation.  The final treatment plan was arrived at through a shared patient decision making process model.      I personally saw and evaluated the patient, and participated in the management and treatment plan.  Vanetta Mulders, MD Attending Physician, Orthopedic Surgery  This document was dictated using Dragon voice recognition software. A reasonable attempt at proof reading has been made to minimize errors.

## 2022-02-14 NOTE — Addendum Note (Signed)
Addended by: Raynelle Fanning A on: 02/14/2022 04:56 PM   Modules accepted: Orders

## 2022-02-15 ENCOUNTER — Ambulatory Visit (INDEPENDENT_AMBULATORY_CARE_PROVIDER_SITE_OTHER): Payer: 59 | Admitting: Physical Therapy

## 2022-02-15 ENCOUNTER — Encounter: Payer: Self-pay | Admitting: Physical Therapy

## 2022-02-15 DIAGNOSIS — M25551 Pain in right hip: Secondary | ICD-10-CM

## 2022-02-15 NOTE — Therapy (Signed)
OUTPATIENT PHYSICAL THERAPY LOWER EXTREMITY EVALUATION   Patient Name: Samantha Becker MRN: [id number] DOB:[birth year], 66 y.o., female Today's Date: 02/15/2022   PT End of Session - 02/15/22 2106     Visit Number 1    Number of Visits 12    Date for PT Re-Evaluation 03/29/22    Authorization Type UHC    PT Start Time 0806    PT Stop Time 0845    PT Time Calculation (min) 39 min    Activity Tolerance Patient tolerated treatment well    Behavior During Therapy WFL for tasks assessed/performed             Past Medical History:  Diagnosis Date   Arthritis    Blood type, Rh negative    Chicken pox    GERD (gastroesophageal reflux disease)    Leaking of urine    Low iron    Mumps    Past Surgical History:  Procedure Laterality Date   CESAREAN SECTION     4 cs   LAPAROSCOPY  03/04/2012   Procedure: LAPAROSCOPY OPERATIVE;  Surgeon: Eldred Manges, MD;  Location: [facility];  Service: Gynecology;  Laterality: N/A;  Peritoneum Biopsy   TUBAL LIGATION Bilateral    Patient Active Problem List   Diagnosis Date Noted   Endolymphatic hydrops of right ear 08/18/2020   Right-sided sensorineural hearing loss 08/18/2020   Atrophic vaginitis 03/20/2017   Endometriosis 03/18/2012   Dyspareunia 02/01/2012    PCP: Inda Coke  REFERRING PROVIDER: Lynne Leader  REFERRING DIAG: R hip pain  THERAPY DIAG:  Pain in right hip  Rationale for Evaluation and Treatment Rehabilitation  ONSET DATE:   SUBJECTIVE:   SUBJECTIVE STATEMENT: Pt states R hip/glute pain. Was seen in PT previously for same issue, with good resolution of pain-about 2 years ago. Has had increased pain for about 6 months, no injury to report. Has not had injection, none previously.  Also has pain in R AC joint, is going to have surgery likely end of July.  Likes yoga/walking. Grandchildren, gardening.   PERTINENT HISTORY:   PAIN:  Are you having pain? Yes: NPRS scale: 8/10 Pain location: R hip   Pain description: Sore Aggravating factors: late at night, and early AM.  Relieving factors: movement, walking.   PRECAUTIONS: None  WEIGHT BEARING RESTRICTIONS No  FALLS:  Has patient fallen in last 6 months? No  PLOF: Independent  PATIENT GOALS   decreased pain in R hip.    OBJECTIVE:   DIAGNOSTIC FINDINGS: no imaging.    COGNITION:  Overall cognitive status: Within functional limits for tasks assessed      POSTURE: No Significant postural limitations  PALPATION: Tenderness in Gr troch, into R glute med, min, piriformis.    LOWER EXTREMITY ROM  Lumbar: WFL,   Hips: WFL  LOWER EXTREMITY MMT:  Hips: flex: 4+/5, abd: 4/5   LOWER EXTREMITY SPECIAL TESTS:  Neg Fabir, Fader,     TODAY'S TREATMENT: Ther ex: See below for HEP,  Manual: DTM/TPR to R Glute    PATIENT EDUCATION:  Education details: PT POC, Exam findings, HEP Person educated: Patient Education method: Explanation, Demonstration, Tactile cues, Verbal cues, and Handouts Education comprehension: verbalized understanding, returned demonstration, verbal cues required, tactile cues required, and needs further education   HOME EXERCISE PROGRAM: Access Code: [email or web address] URL: [email or web address] Date: 02/15/2022 Prepared by: Lyndee Hensen  Exercises - Supine Figure 4 Piriformis Stretch  - 2 x daily - 3 reps - 30 hold - Supine  Piriformis Stretch with Leg Straight  - 2 x daily - 3 reps - 30 hold - Modified Thomas Stretch  - 1 x daily - 2 sets - 3 reps - 30 hold - Sidelying Hip Abduction  - 1 x daily - 2 sets - 10 reps   ASSESSMENT:  CLINICAL IMPRESSION: Patient presents with primary complaint of increased pain in R hip. She has increased pain and tenderness in gr troch and muscle tension into R glute. She has weakness in R hip and core, and will benefit from strength and stabilization. Pt to benefit from skilled PT to improve deficits and pain.   OBJECTIVE IMPAIRMENTS decreased  activity tolerance, decreased knowledge of use of DME, decreased mobility, decreased strength, increased muscle spasms, improper body mechanics, and pain.   ACTIVITY LIMITATIONS lifting, bending, standing, squatting, stairs, and locomotion level  PARTICIPATION LIMITATIONS: cleaning, shopping, community activity, and yard work  PERSONAL FACTORS Time since onset of injury/illness/exacerbation are also affecting patient's functional outcome.   REHAB POTENTIAL: Good  CLINICAL DECISION MAKING: Stable/uncomplicated  EVALUATION COMPLEXITY: Low   GOALS: Goals reviewed with patient? Yes  SHORT TERM GOALS: Target date:  03/01/22  Pt to be independent with initial HEP  Goal status: INITIAL  2.  Pt to report decrease in pain in glute muscles and soft tissue by at least 50%, for decreased pain.   Goal status: INITIAL    LONG TERM GOALS: Target date: 03/29/22  Pt to be independent with final HEP  Goal status: INITIAL  2.  Pt to report decreased pain in R hip and glute to 0-2/10 with activity.  Goal status: INITIAL  3.  Pt to demo improved strength of bil hips to at least 4+/5 to improve stability and pain.   Goal status: INITIAL  4.  Pt to demo ability for standing/walking activity for at least 30 min without increased pain, to improve ability for community activity.    Goal status: INITIAL    PLAN: PT FREQUENCY: 1-2x/week  PT DURATION: 6 weeks  PLANNED INTERVENTIONS: Therapeutic exercises, Therapeutic activity, Neuromuscular re-education, Gait training, Patient/Family education, Joint manipulation, Joint mobilization, Stair training, DME instructions, Dry Needling, Electrical stimulation, Spinal manipulation, Spinal mobilization, Cryotherapy, Moist heat, Taping, Traction, Ultrasound, Ionotophoresis '4mg'$ /ml Dexamethasone, and Manual therapy  PLAN FOR NEXT SESSION:   Lyndee Hensen, PT, DPT 9:44 PM  02/15/22

## 2022-02-20 ENCOUNTER — Encounter: Payer: Self-pay | Admitting: Physical Therapy

## 2022-02-20 ENCOUNTER — Ambulatory Visit (INDEPENDENT_AMBULATORY_CARE_PROVIDER_SITE_OTHER): Payer: 59 | Admitting: Physical Therapy

## 2022-02-20 DIAGNOSIS — M25551 Pain in right hip: Secondary | ICD-10-CM | POA: Diagnosis not present

## 2022-02-20 NOTE — Therapy (Signed)
OUTPATIENT PHYSICAL THERAPY LOWER EXTREMITY TREATMENT   Patient Name: Samantha Becker MRN: [id number] DOB:1956-05-08, 66 y.o., female Today's Date: 02/20/2022   PT End of Session - 02/20/22 0812     Visit Number 2    Number of Visits 12    Date for PT Re-Evaluation 03/29/22    Authorization Type UHC    PT Start Time 0806    PT Stop Time 0845    PT Time Calculation (min) 39 min    Activity Tolerance Patient tolerated treatment well    Behavior During Therapy WFL for tasks assessed/performed             Past Medical History:  Diagnosis Date   Arthritis    Blood type, Rh negative    Chicken pox    GERD (gastroesophageal reflux disease)    Leaking of urine    Low iron    Mumps    Past Surgical History:  Procedure Laterality Date   CESAREAN SECTION     4 cs   LAPAROSCOPY  03/04/2012   Procedure: LAPAROSCOPY OPERATIVE;  Surgeon: Hal Morales, MD;  Location: [facility];  Service: Gynecology;  Laterality: N/A;  Peritoneum Biopsy   TUBAL LIGATION Bilateral    Patient Active Problem List   Diagnosis Date Noted   Endolymphatic hydrops of right ear 08/18/2020   Right-sided sensorineural hearing loss 08/18/2020   Atrophic vaginitis 03/20/2017   Endometriosis 03/18/2012   Dyspareunia 02/01/2012    PCP: Jarold Motto  REFERRING PROVIDER: Clementeen Graham  REFERRING DIAG: R hip pain  THERAPY DIAG:  Pain in right hip  Rationale for Evaluation and Treatment Rehabilitation  ONSET DATE:   SUBJECTIVE:   SUBJECTIVE STATEMENT: 02/20/2022 Pt states some relief after last session in glute muscle, but pain relief short lived. Has been doing HEP. Sore today.  Eval:Pt states R hip/glute pain. Was seen in PT previously for same issue, with good resolution of pain-about 2 years ago. Has had increased pain for about 6 months, no injury to report. Has not had injection, none previously.  Also has pain in R AC joint, is going to have surgery likely end of July.  Likes  yoga/walking. Grandchildren, gardening.   PERTINENT HISTORY:   PAIN:  Are you having pain? Yes: NPRS scale: 8/10 Pain location: R hip  Pain description: Sore Aggravating factors: late at night, and early AM.  Relieving factors: movement, walking.   PRECAUTIONS: None  WEIGHT BEARING RESTRICTIONS No  FALLS:  Has patient fallen in last 6 months? No  PLOF: Independent  PATIENT GOALS   decreased pain in R hip.    OBJECTIVE:   DIAGNOSTIC FINDINGS: no imaging.    COGNITION:  Overall cognitive status: Within functional limits for tasks assessed      POSTURE: No Significant postural limitations  PALPATION: Tenderness in Gr troch, into R glute med, min, piriformis.    LOWER EXTREMITY ROM  Lumbar: WFL,   Hips: WFL  LOWER EXTREMITY MMT:  Hips: flex: 4+/5, abd: 4/5   LOWER EXTREMITY SPECIAL TESTS:  Neg Bo Mcclintock,     TODAY'S TREATMENT:  02/20/2022 Therapeutic Exercise: Aerobic:  L2 x 7 min;  Supine: Bridging with GTB at thighs x 20;  S/L hip abd x10 on R;  Clams x10 on R;  Seated: Standing: Hip abd 2x10 bil at counter; March x20; Walk/March in hallway 10 ft x 4;  Stretches:   Neuromuscular Re-education: Manual Therapy:  DTM/TPR to R glute Trigger Point Dry-Needling  Treatment instructions:  Expect mild to moderate muscle soreness. S/S of pneumothorax if dry needled over a lung field, and to seek immediate medical attention should they occur. Patient verbalized understanding of these instructions and education.  Patient Consent Given: Yes Education handout provided: Previously provided Muscles treated: R deep hip rotators Electrical stimulation performed: No Parameters: N/A Treatment response/outcome: palpable increase in muscle length.    PATIENT EDUCATION:  Education details: Reviewed HEP Person educated: Patient Education method: Explanation, Demonstration, Tactile cues, Verbal cues, and Handouts Education comprehension: verbalized understanding,  returned demonstration, verbal cues required, tactile cues required, and needs further education   HOME EXERCISE PROGRAM: Access Code: [email or web address]   ASSESSMENT:  CLINICAL IMPRESSION: 02/20/2022 Patient with soreness in gr troch and glute muscles today. Addressed with manual soft tissue release and DN. Tolerated strengthening well, some difficulty due to weakness. She does have decreased hip stability and ability for SLS on R, with pain. Plan to progress as tolerated.   OBJECTIVE IMPAIRMENTS decreased activity tolerance, decreased knowledge of use of DME, decreased mobility, decreased strength, increased muscle spasms, improper body mechanics, and pain.   ACTIVITY LIMITATIONS lifting, bending, standing, squatting, stairs, and locomotion level  PARTICIPATION LIMITATIONS: cleaning, shopping, community activity, and yard work  PERSONAL FACTORS Time since onset of injury/illness/exacerbation are also affecting patient's functional outcome.   REHAB POTENTIAL: Good  CLINICAL DECISION MAKING: Stable/uncomplicated  EVALUATION COMPLEXITY: Low   GOALS: Goals reviewed with patient? Yes  SHORT TERM GOALS: Target date:  03/01/22  Pt to be independent with initial HEP  Goal status: INITIAL  2.  Pt to report decrease in pain in glute muscles and soft tissue by at least 50%, for decreased pain.   Goal status: INITIAL    LONG TERM GOALS: Target date: 03/29/22  Pt to be independent with final HEP  Goal status: INITIAL  2.  Pt to report decreased pain in R hip and glute to 0-2/10 with activity.  Goal status: INITIAL  3.  Pt to demo improved strength of bil hips to at least 4+/5 to improve stability and pain.   Goal status: INITIAL  4.  Pt to demo ability for standing/walking activity for at least 30 min without increased pain, to improve ability for community activity.    Goal status: INITIAL    PLAN: PT FREQUENCY: 1-2x/week  PT DURATION: 6 weeks  PLANNED INTERVENTIONS:  Therapeutic exercises, Therapeutic activity, Neuromuscular re-education, Gait training, Patient/Family education, Joint manipulation, Joint mobilization, Stair training, DME instructions, Dry Needling, Electrical stimulation, Spinal manipulation, Spinal mobilization, Cryotherapy, Moist heat, Taping, Traction, Ultrasound, Ionotophoresis 4mg /ml Dexamethasone, and Manual therapy  PLAN FOR NEXT SESSION:   Sedalia Muta, PT, DPT 10:56 AM  02/20/22

## 2022-02-28 ENCOUNTER — Encounter: Payer: Self-pay | Admitting: Physician Assistant

## 2022-02-28 ENCOUNTER — Ambulatory Visit (INDEPENDENT_AMBULATORY_CARE_PROVIDER_SITE_OTHER): Payer: 59 | Admitting: Physician Assistant

## 2022-02-28 VITALS — BP 110/72 | HR 71 | Temp 98.4°F | Ht 62.0 in | Wt 132.4 lb

## 2022-02-28 DIAGNOSIS — R21 Rash and other nonspecific skin eruption: Secondary | ICD-10-CM

## 2022-02-28 DIAGNOSIS — Z01818 Encounter for other preprocedural examination: Secondary | ICD-10-CM

## 2022-02-28 DIAGNOSIS — N952 Postmenopausal atrophic vaginitis: Secondary | ICD-10-CM

## 2022-02-28 LAB — CBC WITH DIFFERENTIAL/PLATELET
Basophils Absolute: 0.1 10*3/uL (ref 0.0–0.1)
Basophils Relative: 0.8 % (ref 0.0–3.0)
Eosinophils Absolute: 0.1 10*3/uL (ref 0.0–0.7)
Eosinophils Relative: 2.1 % (ref 0.0–5.0)
HCT: 37.7 % (ref 36.0–46.0)
Hemoglobin: 12.5 g/dL (ref 12.0–15.0)
Lymphocytes Relative: 26.1 % (ref 12.0–46.0)
Lymphs Abs: 1.8 10*3/uL (ref 0.7–4.0)
MCHC: 33.2 g/dL (ref 30.0–36.0)
MCV: 88.1 fl (ref 78.0–100.0)
Monocytes Absolute: 0.7 10*3/uL (ref 0.1–1.0)
Monocytes Relative: 9.8 % (ref 3.0–12.0)
Neutro Abs: 4.2 10*3/uL (ref 1.4–7.7)
Neutrophils Relative %: 61.2 % (ref 43.0–77.0)
Platelets: 261 10*3/uL (ref 150.0–400.0)
RBC: 4.28 Mil/uL (ref 3.87–5.11)
RDW: 13 % (ref 11.5–15.5)
WBC: 6.9 10*3/uL (ref 4.0–10.5)

## 2022-02-28 LAB — COMPREHENSIVE METABOLIC PANEL
ALT: 19 U/L (ref 0–35)
AST: 20 U/L (ref 0–37)
Albumin: 4.6 g/dL (ref 3.5–5.2)
Alkaline Phosphatase: 58 U/L (ref 39–117)
BUN: 17 mg/dL (ref 6–23)
CO2: 26 mEq/L (ref 19–32)
Calcium: 9.6 mg/dL (ref 8.4–10.5)
Chloride: 98 mEq/L (ref 96–112)
Creatinine, Ser: 0.77 mg/dL (ref 0.40–1.20)
GFR: 81.74 mL/min (ref >60.00)
Glucose, Bld: 95 mg/dL (ref 70–99)
Potassium: 4.9 mEq/L (ref 3.5–5.1)
Sodium: 135 mEq/L (ref 135–145)
Total Bilirubin: 0.4 mg/dL (ref 0.2–1.2)
Total Protein: 7 g/dL (ref 6.0–8.3)

## 2022-02-28 LAB — PROTIME-INR
INR: 1 ratio (ref 0.8–1.0)
Prothrombin Time: 10.8 s (ref 9.6–13.1)

## 2022-02-28 MED ORDER — TRIAMCINOLONE ACETONIDE 0.1 % EX CREA: TOPICAL_CREAM | CUTANEOUS | 0 refills | Status: DC

## 2022-02-28 MED ORDER — PREMARIN 0.625 MG/GM VA CREA: TOPICAL_CREAM | VAGINAL | 2 refills | Status: DC

## 2022-02-28 NOTE — Progress Notes (Signed)
All blood work was normal. Patient is low-risk surgical candidate and is cleared for surgery from a medical and cardiac standpoint from our office. Please print this information and send with medical clearance form.

## 2022-02-28 NOTE — Patient Instructions (Signed)
It was great to see you!  I have sent in ointment for your poison ivy  We will complete your medical clearance once labs have returned and we will send this to Dr. Eddie Dibbles office  Take care,  Inda Coke PA-C

## 2022-02-28 NOTE — Progress Notes (Signed)
Samantha Becker is a 66 y.o. female here for a follow in shoulder pain.   History of Present Illness:   Chief Complaint  Patient presents with   Surgical clearance    HPI  Pre-op testing  Patient is here for pre-op clearance. She has been dealing with ongoing right AC shoulder pain for past several years. Has tried physical therapy in past with some relief. She has seen Dr. Georgina Snell for this issue and was given steroid injection on 11/22/20222. Per pt, this helped with the pain for 6 weeks. She has tried several injections and exercises with no improvement. She has considered surgery and will be undergoing right shoulder arthroscopy with distal claudia. She will be under anesthesia regional bier block. Denies any chest pain or shortness of breath with activity. No other concerning sx.   Vaginal Dryness  She is currently using Permarin 0.625 mg for her vaginal dryness. She is tolerating well without adverse affects. Requesting a refill today. Denies any pelvic pain or vaginal pain, denies unusual vaginal bleeding.   Poison Ivy  Patient has been experiencing some redness. Per pt, symptoms seems to be getting better. She is requesting ointment cream for her symptoms. Denies difficulty breathing or fever. Denies any worsening symptoms.   Past Medical History:  Diagnosis Date   Arthritis    Blood type, Rh negative    Chicken pox    GERD (gastroesophageal reflux disease)    Leaking of urine    Low iron    Mumps      Social History   Tobacco Use   Smoking status: Never   Smokeless tobacco: Never  Substance Use Topics   Alcohol use: Yes    Alcohol/week: 4.0 standard drinks of alcohol    Types: 2 Glasses of wine, 2 Cans of beer per week   Drug use: No    Past Surgical History:  Procedure Laterality Date   CESAREAN SECTION     4 cs   LAPAROSCOPY  03/04/2012   Procedure: LAPAROSCOPY OPERATIVE;  Surgeon: Eldred Manges, MD;  Location: [facility];  Service: Gynecology;   Laterality: N/A;  Peritoneum Biopsy   TUBAL LIGATION Bilateral     Family History  Problem Relation Age of Onset   Heart disease Father    Hypertension Father    Cancer Father    Stroke Father    Heart disease Mother    Hypertension Mother    Arthritis Mother    Breast cancer Mother    Osteoporosis Mother    Arthritis Sister    Asthma Sister     No Known Allergies  Current Medications:   Current Outpatient Medications:    conjugated estrogens (PREMARIN) vaginal cream, Premarin 0.625 mg/gram vaginal cream  Insert 0.5 applicatorsful twice a week by vaginal route., Disp: 42.5 g, Rfl: 2   diclofenac Sodium (VOLTAREN) 1 % GEL, Apply 2 g topically as needed., Disp: , Rfl:    fluticasone (FLONASE) 50 MCG/ACT nasal spray, Place 1 spray into both nostrils as needed for allergies or rhinitis., Disp: , Rfl:    ibuprofen (ADVIL) 200 MG tablet, Take 800 mg by mouth every 6 (six) hours as needed., Disp: , Rfl:    oxycodone (OXY-IR) 5 MG capsule, Take 1 capsule (5 mg total) by mouth every 4 (four) hours as needed (severe pain). (Patient not taking: Reported on 02/28/2022), Disp: 20 capsule, Rfl: 0   Review of Systems:   ROS Negative unless otherwise specified per HPI.   Vitals:  Vitals:   02/28/22 1032  BP: 110/72  Pulse: 71  Temp: 98.4 F (36.9 C)  TempSrc: Temporal  SpO2: 96%  Weight: 132 lb 6.1 oz (60 kg)  Height: '5\' 2"'$  (1.575 m)     Body mass index is 24.21 kg/m.  Physical Exam:   Physical Exam Vitals and nursing note reviewed.  Constitutional:      General: She is not in acute distress.    Appearance: She is well-developed. She is not ill-appearing or toxic-appearing.  Cardiovascular:     Rate and Rhythm: Normal rate and regular rhythm.     Pulses: Normal pulses.     Heart sounds: Normal heart sounds, S1 normal and S2 normal.  Pulmonary:     Effort: Pulmonary effort is normal.     Breath sounds: Normal breath sounds.  Skin:    General: Skin is warm and dry.      Comments: R anterior thigh with erythema -- well demarcated and linear  Neurological:     Mental Status: She is alert.     GCS: GCS eye subscore is 4. GCS verbal subscore is 5. GCS motor subscore is 6.  Psychiatric:        Speech: Speech normal.        Behavior: Behavior normal. Behavior is cooperative.     Assessment and Plan:   Pre-op testing Suspect low-risk surgical candidate EKG tracing is personally reviewed.  EKG notes NSR.  No acute changes.  Will fax over clearance once blood work has returned  Atrophic vaginitis Stable on premarin -- refilled today Follow-up yearly or as needed  Rash Improving per patient Will send in triamcinolone ointment for prn use Follow-up as needed   I,Savera Zaman,acting as a scribe for [employer], PA.,have documented all relevant documentation on the behalf of Inda Coke, PA,as directed by  Inda Coke, PA while in the presence of Inda Coke, [place].   I, Inda Coke, [place], have reviewed all documentation for this visit. The documentation on 02/28/22 for the exam, diagnosis, procedures, and orders are all accurate and complete.  Inda Coke, PA-C

## 2022-03-06 ENCOUNTER — Ambulatory Visit (INDEPENDENT_AMBULATORY_CARE_PROVIDER_SITE_OTHER): Payer: 59 | Admitting: Physical Therapy

## 2022-03-06 ENCOUNTER — Encounter: Payer: Self-pay | Admitting: Physical Therapy

## 2022-03-06 ENCOUNTER — Other Ambulatory Visit: Payer: Self-pay

## 2022-03-06 DIAGNOSIS — M25551 Pain in right hip: Secondary | ICD-10-CM

## 2022-03-06 NOTE — Therapy (Addendum)
OUTPATIENT PHYSICAL THERAPY LOWER EXTREMITY TREATMENT   Patient Name: Samantha Becker MRN: [id number] DOB:1955-09-18, 66 y.o., female Today's Date: 03/06/2022   PT End of Session - 03/06/22 0848     Visit Number 3    Number of Visits 12    Date for PT Re-Evaluation 03/29/22    Authorization Type UHC    PT Start Time 0849    PT Stop Time 0931    PT Time Calculation (min) 42 min    Activity Tolerance Patient tolerated treatment well    Behavior During Therapy WFL for tasks assessed/performed              Past Medical History:  Diagnosis Date   Arthritis    Blood type, Rh negative    Chicken pox    GERD (gastroesophageal reflux disease)    Leaking of urine    Low iron    Mumps    Past Surgical History:  Procedure Laterality Date   CESAREAN SECTION     4 cs   LAPAROSCOPY  03/04/2012   Procedure: LAPAROSCOPY OPERATIVE;  Surgeon: Eldred Manges, MD;  Location: [facility];  Service: Gynecology;  Laterality: N/A;  Peritoneum Biopsy   TUBAL LIGATION Bilateral    Patient Active Problem List   Diagnosis Date Noted   Endolymphatic hydrops of right ear 08/18/2020   Right-sided sensorineural hearing loss 08/18/2020   Atrophic vaginitis 03/20/2017   Endometriosis 03/18/2012   Dyspareunia 02/01/2012    PCP: Inda Coke  REFERRING PROVIDER: Lynne Leader  REFERRING DIAG: R hip pain  THERAPY DIAG:  Pain in right hip  Rationale for Evaluation and Treatment Rehabilitation  ONSET DATE:   SUBJECTIVE:   SUBJECTIVE STATEMENT: 03/06/2022 Pt states much relief after last session with dry needling. Feels increased tension at night.   Eval:Pt states R hip/glute pain. Was seen in PT previously for same issue, with good resolution of pain-about 2 years ago. Has had increased pain for about 6 months, no injury to report. Has not had injection, none previously.  Also has pain in R AC joint, is going to have surgery likely end of July.  Likes yoga/walking. Grandchildren,  gardening.   PERTINENT HISTORY:   PAIN:  Are you having pain? Yes: NPRS scale: 8/10 Pain location: R hip  Pain description: Sore Aggravating factors: late at night, and early AM.  Relieving factors: movement, walking.   PRECAUTIONS: None  WEIGHT BEARING RESTRICTIONS No  FALLS:  Has patient fallen in last 6 months? No  PLOF: Independent  PATIENT GOALS   decreased pain in R hip.    OBJECTIVE:   DIAGNOSTIC FINDINGS: no imaging.    COGNITION:  Overall cognitive status: Within functional limits for tasks assessed      POSTURE: No Significant postural limitations  PALPATION: Tenderness in Gr troch, into R glute med, min, piriformis.    LOWER EXTREMITY ROM  Lumbar: WFL,   Hips: WFL  LOWER EXTREMITY MMT:  Hips: flex: 4+/5, abd: 4/5   LOWER EXTREMITY SPECIAL TESTS:  Neg Benjamine Mola,     TODAY'S TREATMENT:  03/06/2022 Therapeutic Exercise: Aerobic:  L2 x 7 min;  Supine: Bridging with GTB at thighs x 20;  Clams/alternating GTB x20;  S/L Clams x 15 on R;  Seated: Standing: Hip abd and ext  2x10 bil at counter; March x20;  Stretches:  Seated piriformis x 20;  Neuromuscular Re-education: Manual Therapy:   DTM/TPR to R glute, rotators, just behind gr troch.     PATIENT  EDUCATION:  Education details: Reviewed HEP Person educated: Patient Education method: Explanation, Demonstration, Tactile cues, Verbal cues, and Handouts Education comprehension: verbalized understanding, returned demonstration, verbal cues required, tactile cues required, and needs further education   HOME EXERCISE PROGRAM: Access Code: [email or web address]   ASSESSMENT:  CLINICAL IMPRESSION: 03/06/2022 Pt with soreness with palpation of glute musculature today, but minimal pain with activity. Improved ability for strengthening today with no pain. Plan to progress strength as able.   OBJECTIVE IMPAIRMENTS decreased activity tolerance, decreased knowledge of use of DME, decreased mobility,  decreased strength, increased muscle spasms, improper body mechanics, and pain.   ACTIVITY LIMITATIONS lifting, bending, standing, squatting, stairs, and locomotion level  PARTICIPATION LIMITATIONS: cleaning, shopping, community activity, and yard work  PERSONAL FACTORS Time since onset of injury/illness/exacerbation are also affecting patient's functional outcome.   REHAB POTENTIAL: Good  CLINICAL DECISION MAKING: Stable/uncomplicated  EVALUATION COMPLEXITY: Low   GOALS: Goals reviewed with patient? Yes  SHORT TERM GOALS: Target date:  03/01/22  Pt to be independent with initial HEP  Goal status: INITIAL  2.  Pt to report decrease in pain in glute muscles and soft tissue by at least 50%, for decreased pain.   Goal status: INITIAL    LONG TERM GOALS: Target date: 03/29/22  Pt to be independent with final HEP  Goal status: INITIAL  2.  Pt to report decreased pain in R hip and glute to 0-2/10 with activity.  Goal status: INITIAL  3.  Pt to demo improved strength of bil hips to at least 4+/5 to improve stability and pain.   Goal status: INITIAL  4.  Pt to demo ability for standing/walking activity for at least 30 min without increased pain, to improve ability for community activity.    Goal status: INITIAL    PLAN: PT FREQUENCY: 1-2x/week  PT DURATION: 6 weeks  PLANNED INTERVENTIONS: Therapeutic exercises, Therapeutic activity, Neuromuscular re-education, Gait training, Patient/Family education, Joint manipulation, Joint mobilization, Stair training, DME instructions, Dry Needling, Electrical stimulation, Spinal manipulation, Spinal mobilization, Cryotherapy, Moist heat, Taping, Traction, Ultrasound, Ionotophoresis '4mg'$ /ml Dexamethasone, and Manual therapy  PLAN FOR NEXT SESSION:   Lyndee Hensen, PT, DPT 9:33 AM  03/06/22

## 2022-03-09 ENCOUNTER — Encounter: Payer: Self-pay | Admitting: Physical Therapy

## 2022-03-09 ENCOUNTER — Ambulatory Visit (INDEPENDENT_AMBULATORY_CARE_PROVIDER_SITE_OTHER): Payer: 59 | Admitting: Physical Therapy

## 2022-03-09 DIAGNOSIS — M25551 Pain in right hip: Secondary | ICD-10-CM | POA: Diagnosis not present

## 2022-03-09 NOTE — Therapy (Signed)
OUTPATIENT PHYSICAL THERAPY LOWER EXTREMITY TREATMENT   Patient Name: Samantha Becker MRN: [id number] DOB:21-Jun-1956, 66 y.o., female Today's Date: 03/09/2022   PT End of Session - 03/09/22 1041     Visit Number 4    Number of Visits 12    Date for PT Re-Evaluation 03/29/22    Authorization Type [employer]    PT Start Time 1020    PT Stop Time 1100    PT Time Calculation (min) 40 min    Activity Tolerance Patient tolerated treatment well    Behavior During Therapy WFL for tasks assessed/performed               Past Medical History:  Diagnosis Date   Arthritis    Blood type, Rh negative    Chicken pox    GERD (gastroesophageal reflux disease)    Leaking of urine    Low iron    Mumps    Past Surgical History:  Procedure Laterality Date   CESAREAN SECTION     4 cs   LAPAROSCOPY  03/04/2012   Procedure: LAPAROSCOPY OPERATIVE;  Surgeon: Eldred Manges, MD;  Location: [facility];  Service: Gynecology;  Laterality: N/A;  Peritoneum Biopsy   TUBAL LIGATION Bilateral    Patient Active Problem List   Diagnosis Date Noted   Endolymphatic hydrops of right ear 08/18/2020   Right-sided sensorineural hearing loss 08/18/2020   Atrophic vaginitis 03/20/2017   Endometriosis 03/18/2012   Dyspareunia 02/01/2012    PCP: Inda Coke  REFERRING PROVIDER: Lynne Leader  REFERRING DIAG: R hip pain  THERAPY DIAG:  Pain in right hip  Rationale for Evaluation and Treatment Rehabilitation  ONSET DATE:   SUBJECTIVE:   SUBJECTIVE STATEMENT: 03/09/2022 Pt states soreness in the night and this am. Has been pretty good during the day and with activity.   Eval:Pt states R hip/glute pain. Was seen in PT previously for same issue, with good resolution of pain-about 2 years ago. Has had increased pain for about 6 months, no injury to report. Has not had injection, none previously.  Also has pain in R AC joint, is going to have surgery likely end of July.  Likes yoga/walking.  Grandchildren, gardening.   PERTINENT HISTORY:   PAIN:  Are you having pain? Yes: NPRS scale: 6/10 Pain location: R hip  Pain description: Sore Aggravating factors: late at night, and early AM.  Relieving factors: movement, walking.   PRECAUTIONS: None  WEIGHT BEARING RESTRICTIONS No  FALLS:  Has patient fallen in last 6 months? No  PLOF: Independent  PATIENT GOALS   decreased pain in R hip.    OBJECTIVE:   DIAGNOSTIC FINDINGS: no imaging.    COGNITION:  Overall cognitive status: Within functional limits for tasks assessed      POSTURE: No Significant postural limitations  PALPATION: Tenderness in Gr troch, into R glute med, min, piriformis.    LOWER EXTREMITY ROM  Lumbar: WFL,   Hips: WFL  LOWER EXTREMITY MMT:  Hips: flex: 4+/5, abd: 4/5   LOWER EXTREMITY SPECIAL TESTS:  Nadara Mustard,     TODAY'S TREATMENT:  03/09/2022 Therapeutic Exercise: Aerobic:  L2 x 8  min;  Supine:   Clams/alternating GTB x20;   Seated: Standing: Hip abd and ext  2x10 bil at counter; March x20; Lateral step up x10 bil;  Stretches:  Seated piriformis x 20;  Neuromuscular Re-education: Manual Therapy:   DTM/TPR to R glute, piriformis Trigger Point Dry-Needling  Treatment instructions: Expect mild to moderate  muscle soreness. S/S of pneumothorax if dry needled over a lung field, and to seek immediate medical attention should they occur. Patient verbalized understanding of these instructions and education.  Patient Consent Given: Yes Education handout provided: Previously provided Muscles treated: R piriformis Electrical stimulation performed: No Parameters: N/A Treatment response/outcome: Palpable increase in muscle length, good twitch response.   Marland Kitchen PATIENT EDUCATION:  Education details: Reviewed HEP Person educated: Patient Education method: Explanation, Demonstration, Tactile cues, Verbal cues, and Handouts Education comprehension: verbalized understanding,  returned demonstration, verbal cues required, tactile cues required, and needs further education   HOME EXERCISE PROGRAM: Access Code: [email or web address]   ASSESSMENT:  CLINICAL IMPRESSION: 03/09/2022 Pt with most tenderness in piriformis today, addressed with manual and dry needling, good twitch response today. Pt to benefit from continued strengthening and manual as needed for pain.   OBJECTIVE IMPAIRMENTS decreased activity tolerance, decreased knowledge of use of DME, decreased mobility, decreased strength, increased muscle spasms, improper body mechanics, and pain.   ACTIVITY LIMITATIONS lifting, bending, standing, squatting, stairs, and locomotion level  PARTICIPATION LIMITATIONS: cleaning, shopping, community activity, and yard work  PERSONAL FACTORS Time since onset of injury/illness/exacerbation are also affecting patient's functional outcome.   REHAB POTENTIAL: Good  CLINICAL DECISION MAKING: Stable/uncomplicated  EVALUATION COMPLEXITY: Low   GOALS: Goals reviewed with patient? Yes  SHORT TERM GOALS: Target date:  03/01/22  Pt to be independent with initial HEP  Goal status: INITIAL  2.  Pt to report decrease in pain in glute muscles and soft tissue by at least 50%, for decreased pain.   Goal status: INITIAL    LONG TERM GOALS: Target date: 03/29/22  Pt to be independent with final HEP  Goal status: INITIAL  2.  Pt to report decreased pain in R hip and glute to 0-2/10 with activity.  Goal status: INITIAL  3.  Pt to demo improved strength of bil hips to at least 4+/5 to improve stability and pain.   Goal status: INITIAL  4.  Pt to demo ability for standing/walking activity for at least 30 min without increased pain, to improve ability for community activity.    Goal status: INITIAL    PLAN: PT FREQUENCY: 1-2x/week  PT DURATION: 6 weeks  PLANNED INTERVENTIONS: Therapeutic exercises, Therapeutic activity, Neuromuscular re-education, Gait training,  Patient/Family education, Joint manipulation, Joint mobilization, Stair training, DME instructions, Dry Needling, Electrical stimulation, Spinal manipulation, Spinal mobilization, Cryotherapy, Moist heat, Taping, Traction, Ultrasound, Ionotophoresis '4mg'$ /ml Dexamethasone, and Manual therapy  PLAN FOR NEXT SESSION:   Lyndee Hensen, PT, DPT 1:39 PM  03/09/22

## 2022-03-13 ENCOUNTER — Encounter (HOSPITAL_BASED_OUTPATIENT_CLINIC_OR_DEPARTMENT_OTHER): Payer: Self-pay | Admitting: Orthopaedic Surgery

## 2022-03-13 ENCOUNTER — Encounter: Payer: 59 | Admitting: Physical Therapy

## 2022-03-13 ENCOUNTER — Other Ambulatory Visit (HOSPITAL_BASED_OUTPATIENT_CLINIC_OR_DEPARTMENT_OTHER): Payer: Self-pay | Admitting: Orthopaedic Surgery

## 2022-03-13 ENCOUNTER — Other Ambulatory Visit: Payer: Self-pay

## 2022-03-14 ENCOUNTER — Encounter: Payer: 59 | Admitting: Physical Therapy

## 2022-03-16 ENCOUNTER — Encounter (HOSPITAL_BASED_OUTPATIENT_CLINIC_OR_DEPARTMENT_OTHER): Payer: Self-pay | Admitting: Orthopaedic Surgery

## 2022-03-16 ENCOUNTER — Encounter: Payer: Self-pay | Admitting: Physical Therapy

## 2022-03-16 ENCOUNTER — Ambulatory Visit (INDEPENDENT_AMBULATORY_CARE_PROVIDER_SITE_OTHER): Payer: 59 | Admitting: Physical Therapy

## 2022-03-16 DIAGNOSIS — M25551 Pain in right hip: Secondary | ICD-10-CM

## 2022-03-16 NOTE — Therapy (Signed)
OUTPATIENT PHYSICAL THERAPY LOWER EXTREMITY TREATMENT   Patient Name: Samantha Becker MRN: [id number] DOB:[birth year], 66 y.o., female Today's Date: 03/16/2022   PT End of Session - 03/16/22 0937     Visit Number 5    Number of Visits 12    Date for PT Re-Evaluation 03/29/22    Authorization Type [employer]    PT Start Time 0932    PT Stop Time 1015    PT Time Calculation (min) 43 min    Activity Tolerance Patient tolerated treatment well    Behavior During Therapy WFL for tasks assessed/performed                Past Medical History:  Diagnosis Date   Arthritis    Blood type, Rh negative    Chicken pox    GERD (gastroesophageal reflux disease)    OTC   Leaking of urine    Low iron    Mumps    Past Surgical History:  Procedure Laterality Date   CESAREAN SECTION     4 cs   LAPAROSCOPY  03/04/2012   Procedure: LAPAROSCOPY OPERATIVE;  Surgeon: Eldred Manges, MD;  Location: [facility];  Service: Gynecology;  Laterality: N/A;  Peritoneum Biopsy   TUBAL LIGATION Bilateral    Patient Active Problem List   Diagnosis Date Noted   Endolymphatic hydrops of right ear 08/18/2020   Right-sided sensorineural hearing loss 08/18/2020   Atrophic vaginitis 03/20/2017   Endometriosis 03/18/2012   Dyspareunia 02/01/2012    PCP: Inda Coke  REFERRING PROVIDER: Lynne Leader  REFERRING DIAG: R hip pain  THERAPY DIAG:  Pain in right hip  Rationale for Evaluation and Treatment Rehabilitation  ONSET DATE:   SUBJECTIVE:   SUBJECTIVE STATEMENT: 03/16/2022  Pt states some improvements in hip. Having surgery on shoulder next week/ Tuesday.   Eval:Pt states R hip/glute pain. Was seen in PT previously for same issue, with good resolution of pain-about 2 years ago. Has had increased pain for about 6 months, no injury to report. Has not had injection, none previously.  Also has pain in R AC joint, is going to have surgery likely end of July.  Likes yoga/walking.  Grandchildren, gardening.   PERTINENT HISTORY:   PAIN:  Are you having pain? Yes: NPRS scale: 4/10 Pain location: R hip  Pain description: Sore Aggravating factors: late at night, and early AM.  Relieving factors: movement, walking.   PRECAUTIONS: None  WEIGHT BEARING RESTRICTIONS No  FALLS:  Has patient fallen in last 6 months? No  PLOF: Independent  PATIENT GOALS   decreased pain in R hip.    OBJECTIVE:   DIAGNOSTIC FINDINGS: no imaging.    COGNITION:  Overall cognitive status: Within functional limits for tasks assessed      POSTURE: No Significant postural limitations  PALPATION: Tenderness in Gr troch, into R glute med, min, piriformis.    LOWER EXTREMITY ROM  Lumbar: WFL,   Hips: WFL  LOWER EXTREMITY MMT:  Hips: flex: 4+/5, abd: 4/5   LOWER EXTREMITY SPECIAL TESTS:  Nadara Mustard,     TODAY'S TREATMENT:  03/16/2022 Therapeutic Exercise: Aerobic:  L2 x 8  min;  Supine:     Seated:     Standing: Hip abd YTB x 10 bil;  March x20; fwd step up w knee drive C94 bil; SLS with UE rotation 2x10 bil; lateral band walks GTB 25 ft x 4;  Stretches:  Seated piriformis IR and ER  x 20;  Neuromuscular Re-education:  Manual Therapy:   DTM/TPR to R glute, piriformis, Hip post mobs.   Marland Kitchen PATIENT EDUCATION:  Education details: Reviewed HEP,  Person educated: Patient Education method: Explanation, Demonstration, Tactile cues, Verbal cues, and Handouts Education comprehension: verbalized understanding, returned demonstration, verbal cues required, tactile cues required, and needs further education   HOME EXERCISE PROGRAM: Access Code: [email or web address]   ASSESSMENT:  CLINICAL IMPRESSION: 03/16/2022 Pt with improved ability for standing strengthening today, and SLS with less pain in hip. Still with soreness in glute with palpation but less muscle tightness to palpate. Discussed continuing hip exercises as able, after shoulder surgery next week. Will do eval for  shoulder at next visit.   OBJECTIVE IMPAIRMENTS decreased activity tolerance, decreased knowledge of use of DME, decreased mobility, decreased strength, increased muscle spasms, improper body mechanics, and pain.   ACTIVITY LIMITATIONS lifting, bending, standing, squatting, stairs, and locomotion level  PARTICIPATION LIMITATIONS: cleaning, shopping, community activity, and yard work  PERSONAL FACTORS Time since onset of injury/illness/exacerbation are also affecting patient's functional outcome.   REHAB POTENTIAL: Good  CLINICAL DECISION MAKING: Stable/uncomplicated  EVALUATION COMPLEXITY: Low   GOALS: Goals reviewed with patient? Yes  SHORT TERM GOALS: Target date:  03/01/22  Pt to be independent with initial HEP  Goal status: INITIAL  2.  Pt to report decrease in pain in glute muscles and soft tissue by at least 50%, for decreased pain.   Goal status: INITIAL    LONG TERM GOALS: Target date: 03/29/22  Pt to be independent with final HEP  Goal status: INITIAL  2.  Pt to report decreased pain in R hip and glute to 0-2/10 with activity.  Goal status: INITIAL  3.  Pt to demo improved strength of bil hips to at least 4+/5 to improve stability and pain.   Goal status: INITIAL  4.  Pt to demo ability for standing/walking activity for at least 30 min without increased pain, to improve ability for community activity.    Goal status: INITIAL    PLAN: PT FREQUENCY: 1-2x/week  PT DURATION: 6 weeks  PLANNED INTERVENTIONS: Therapeutic exercises, Therapeutic activity, Neuromuscular re-education, Gait training, Patient/Family education, Joint manipulation, Joint mobilization, Stair training, DME instructions, Dry Needling, Electrical stimulation, Spinal manipulation, Spinal mobilization, Cryotherapy, Moist heat, Taping, Traction, Ultrasound, Ionotophoresis '4mg'$ /ml Dexamethasone, and Manual therapy  PLAN FOR NEXT SESSION:   Lyndee Hensen, PT, DPT 11:56 AM  03/16/22

## 2022-03-18 NOTE — Anesthesia Preprocedure Evaluation (Signed)
Anesthesia Evaluation  Patient identified by MRN, date of birth, ID band Patient awake    Reviewed: Allergy & Precautions, NPO status , Patient's Chart, lab work & pertinent test results  History of Anesthesia Complications Negative for: history of anesthetic complications  Airway Mallampati: II  TM Distance: >3 FB Neck ROM: Full    Dental no notable dental hx.    Pulmonary neg pulmonary ROS,    Pulmonary exam normal        Cardiovascular negative cardio ROS Normal cardiovascular exam     Neuro/Psych negative neurological ROS  negative psych ROS   GI/Hepatic Neg liver ROS, GERD  Controlled,  Endo/Other  negative endocrine ROS  Renal/GU negative Renal ROS  negative genitourinary   Musculoskeletal  (+) Arthritis , RIGHT ACROMIOCLAVICULAR OSTEOARTHRITIS   Abdominal   Peds  Hematology negative hematology ROS (+)   Anesthesia Other Findings Day of surgery medications reviewed with patient.  Reproductive/Obstetrics negative OB ROS                            Anesthesia Physical Anesthesia Plan  ASA: 2  Anesthesia Plan: General   Post-op Pain Management: Tylenol PO (pre-op)* and Regional block*   Induction: Intravenous  PONV Risk Score and Plan: 3 and Midazolam, Treatment may vary due to age or medical condition, Dexamethasone and Ondansetron  Airway Management Planned: Oral ETT  Additional Equipment: None  Intra-op Plan:   Post-operative Plan: Extubation in OR  Informed Consent: I have reviewed the patients History and Physical, chart, labs and discussed the procedure including the risks, benefits and alternatives for the proposed anesthesia with the patient or authorized representative who has indicated his/her understanding and acceptance.     Dental advisory given  Plan Discussed with: CRNA  Anesthesia Plan Comments:        Anesthesia Quick Evaluation

## 2022-03-20 ENCOUNTER — Encounter (HOSPITAL_BASED_OUTPATIENT_CLINIC_OR_DEPARTMENT_OTHER): Payer: Self-pay | Admitting: Orthopaedic Surgery

## 2022-03-20 ENCOUNTER — Encounter: Payer: 59 | Admitting: Physical Therapy

## 2022-03-20 ENCOUNTER — Encounter (HOSPITAL_BASED_OUTPATIENT_CLINIC_OR_DEPARTMENT_OTHER)
Admission: RE | Disposition: A | Discharge status: Home / Self Care | Payer: Self-pay | Source: Home / Self Care | Attending: Orthopaedic Surgery

## 2022-03-20 ENCOUNTER — Ambulatory Visit (HOSPITAL_BASED_OUTPATIENT_CLINIC_OR_DEPARTMENT_OTHER): Payer: 59 | Admitting: Anesthesiology

## 2022-03-20 ENCOUNTER — Other Ambulatory Visit: Payer: Self-pay

## 2022-03-20 ENCOUNTER — Ambulatory Visit (HOSPITAL_BASED_OUTPATIENT_CLINIC_OR_DEPARTMENT_OTHER)
Admission: RE | Admit: 2022-03-20 | Discharge: 2022-03-20 | Disposition: A | Discharge status: Home / Self Care | Payer: 59 | Attending: Orthopaedic Surgery | Admitting: Orthopaedic Surgery

## 2022-03-20 DIAGNOSIS — M25811 Other specified joint disorders, right shoulder: Secondary | ICD-10-CM | POA: Diagnosis not present

## 2022-03-20 DIAGNOSIS — K219 Gastro-esophageal reflux disease without esophagitis: Secondary | ICD-10-CM | POA: Diagnosis not present

## 2022-03-20 DIAGNOSIS — M19019 Primary osteoarthritis, unspecified shoulder: Secondary | ICD-10-CM

## 2022-03-20 DIAGNOSIS — M19011 Primary osteoarthritis, right shoulder: Secondary | ICD-10-CM | POA: Diagnosis present

## 2022-03-20 DIAGNOSIS — M25711 Osteophyte, right shoulder: Secondary | ICD-10-CM | POA: Diagnosis not present

## 2022-03-20 DIAGNOSIS — Z01818 Encounter for other preprocedural examination: Secondary | ICD-10-CM

## 2022-03-20 DIAGNOSIS — M7541 Impingement syndrome of right shoulder: Secondary | ICD-10-CM

## 2022-03-20 HISTORY — PX: SHOULDER ARTHROSCOPY: SHX128

## 2022-03-20 SURGERY — ARTHROSCOPY, SHOULDER
Anesthesia: General | Site: Shoulder | Laterality: Right

## 2022-03-20 MED ORDER — PROPOFOL 10 MG/ML IV BOLUS
INTRAVENOUS | Status: AC
  Filled 2022-03-20: qty 20

## 2022-03-20 MED ORDER — DEXAMETHASONE SODIUM PHOSPHATE 10 MG/ML IJ SOLN
INTRAMUSCULAR | Status: AC
  Filled 2022-03-20: qty 1

## 2022-03-20 MED ORDER — PHENYLEPHRINE HCL (PRESSORS) 10 MG/ML IV SOLN
INTRAVENOUS | Status: AC
  Filled 2022-03-20: qty 1

## 2022-03-20 MED ORDER — CEFAZOLIN SODIUM-DEXTROSE 2-4 GM/100ML-% IV SOLN
INTRAVENOUS | Status: AC
  Filled 2022-03-20: qty 100

## 2022-03-20 MED ORDER — PROPOFOL 10 MG/ML IV BOLUS
INTRAVENOUS | Status: DC | PRN
  Administered 2022-03-20: 30 mg via INTRAVENOUS
  Administered 2022-03-20: 120 mg via INTRAVENOUS

## 2022-03-20 MED ORDER — ACETAMINOPHEN 500 MG PO TABS
1000.0000 mg | ORAL_TABLET | Freq: Once | ORAL | Status: AC
  Administered 2022-03-20: 1000 mg via ORAL

## 2022-03-20 MED ORDER — EPINEPHRINE PF 1 MG/ML IJ SOLN
INTRAMUSCULAR | Status: AC
Start: 2022-03-20 — End: ?
  Filled 2022-03-20: qty 4

## 2022-03-20 MED ORDER — MIDAZOLAM HCL 2 MG/2ML IJ SOLN
INTRAMUSCULAR | Status: AC
  Filled 2022-03-20: qty 2

## 2022-03-20 MED ORDER — EPHEDRINE 5 MG/ML INJ
INTRAVENOUS | Status: AC
  Filled 2022-03-20: qty 5

## 2022-03-20 MED ORDER — OXYCODONE HCL 5 MG PO TABS: 5.0000 mg | ORAL_TABLET | Freq: Once | ORAL | Status: DC | PRN

## 2022-03-20 MED ORDER — PHENYLEPHRINE HCL (PRESSORS) 10 MG/ML IV SOLN
INTRAVENOUS | Status: DC | PRN
  Administered 2022-03-20: 160 ug via INTRAVENOUS

## 2022-03-20 MED ORDER — CEFAZOLIN SODIUM-DEXTROSE 2-4 GM/100ML-% IV SOLN
2.0000 g | INTRAVENOUS | Status: AC
  Administered 2022-03-20: 2 g via INTRAVENOUS

## 2022-03-20 MED ORDER — FENTANYL CITRATE (PF) 100 MCG/2ML IJ SOLN
INTRAMUSCULAR | Status: DC | PRN
  Administered 2022-03-20: 50 ug via INTRAVENOUS

## 2022-03-20 MED ORDER — GABAPENTIN 300 MG PO CAPS
ORAL_CAPSULE | ORAL | Status: AC
  Filled 2022-03-20: qty 1

## 2022-03-20 MED ORDER — ROCURONIUM BROMIDE 10 MG/ML (PF) SYRINGE
PREFILLED_SYRINGE | INTRAVENOUS | Status: AC
  Filled 2022-03-20: qty 10

## 2022-03-20 MED ORDER — BUPIVACAINE LIPOSOME 1.3 % IJ SUSP
INTRAMUSCULAR | Status: DC | PRN
  Administered 2022-03-20: 10 mL via PERINEURAL

## 2022-03-20 MED ORDER — SODIUM CHLORIDE 0.9 % IR SOLN
Status: DC | PRN
  Administered 2022-03-20: 5000 mL

## 2022-03-20 MED ORDER — SUGAMMADEX SODIUM 200 MG/2ML IV SOLN
INTRAVENOUS | Status: DC | PRN
  Administered 2022-03-20: 150 mg via INTRAVENOUS

## 2022-03-20 MED ORDER — LIDOCAINE 2% (20 MG/ML) 5 ML SYRINGE
INTRAMUSCULAR | Status: AC
  Filled 2022-03-20: qty 5

## 2022-03-20 MED ORDER — MIDAZOLAM HCL 2 MG/2ML IJ SOLN
2.0000 mg | Freq: Once | INTRAMUSCULAR | Status: AC
  Administered 2022-03-20: 1 mg via INTRAVENOUS

## 2022-03-20 MED ORDER — ACETAMINOPHEN 500 MG PO TABS
ORAL_TABLET | ORAL | Status: AC
  Filled 2022-03-20: qty 2

## 2022-03-20 MED ORDER — OXYCODONE HCL 5 MG/5ML PO SOLN: 5.0000 mg | Freq: Once | ORAL | Status: DC | PRN

## 2022-03-20 MED ORDER — TRANEXAMIC ACID-NACL 1000-0.7 MG/100ML-% IV SOLN
1000.0000 mg | INTRAVENOUS | Status: AC
  Administered 2022-03-20: 1000 mg via INTRAVENOUS

## 2022-03-20 MED ORDER — AMISULPRIDE (ANTIEMETIC) 5 MG/2ML IV SOLN
INTRAVENOUS | Status: AC
  Filled 2022-03-20: qty 4

## 2022-03-20 MED ORDER — LACTATED RINGERS IV SOLN: INTRAVENOUS | Status: DC

## 2022-03-20 MED ORDER — PHENYLEPHRINE HCL-NACL 20-0.9 MG/250ML-% IV SOLN
INTRAVENOUS | Status: DC | PRN
  Administered 2022-03-20: 25 ug/min via INTRAVENOUS

## 2022-03-20 MED ORDER — ATROPINE SULFATE 0.4 MG/ML IV SOLN
INTRAVENOUS | Status: AC
  Filled 2022-03-20: qty 1

## 2022-03-20 MED ORDER — ROCURONIUM BROMIDE 100 MG/10ML IV SOLN
INTRAVENOUS | Status: DC | PRN
  Administered 2022-03-20: 40 mg via INTRAVENOUS

## 2022-03-20 MED ORDER — GABAPENTIN 300 MG PO CAPS: 300.0000 mg | ORAL_CAPSULE | Freq: Once | ORAL | Status: DC

## 2022-03-20 MED ORDER — BUPIVACAINE-EPINEPHRINE (PF) 0.5% -1:200000 IJ SOLN
INTRAMUSCULAR | Status: DC | PRN
  Administered 2022-03-20: 15 mL via PERINEURAL

## 2022-03-20 MED ORDER — ONDANSETRON HCL 4 MG/2ML IJ SOLN
INTRAMUSCULAR | Status: DC | PRN
  Administered 2022-03-20: 4 mg via INTRAVENOUS

## 2022-03-20 MED ORDER — ONDANSETRON HCL 4 MG/2ML IJ SOLN
INTRAMUSCULAR | Status: AC
  Filled 2022-03-20: qty 2

## 2022-03-20 MED ORDER — DEXAMETHASONE SODIUM PHOSPHATE 10 MG/ML IJ SOLN
INTRAMUSCULAR | Status: DC | PRN
  Administered 2022-03-20: 8 mg via INTRAVENOUS

## 2022-03-20 MED ORDER — TRANEXAMIC ACID-NACL 1000-0.7 MG/100ML-% IV SOLN
INTRAVENOUS | Status: AC
  Filled 2022-03-20: qty 100

## 2022-03-20 MED ORDER — FENTANYL CITRATE (PF) 100 MCG/2ML IJ SOLN
100.0000 ug | Freq: Once | INTRAMUSCULAR | Status: AC
  Administered 2022-03-20: 50 ug via INTRAVENOUS

## 2022-03-20 MED ORDER — AMISULPRIDE (ANTIEMETIC) 5 MG/2ML IV SOLN
10.0000 mg | Freq: Once | INTRAVENOUS | Status: AC
  Administered 2022-03-20: 10 mg via INTRAVENOUS

## 2022-03-20 MED ORDER — PHENYLEPHRINE 80 MCG/ML (10ML) SYRINGE FOR IV PUSH (FOR BLOOD PRESSURE SUPPORT)
PREFILLED_SYRINGE | INTRAVENOUS | Status: AC
  Filled 2022-03-20: qty 10

## 2022-03-20 MED ORDER — LIDOCAINE 2% (20 MG/ML) 5 ML SYRINGE
INTRAMUSCULAR | Status: DC | PRN
  Administered 2022-03-20: 60 mg via INTRAVENOUS

## 2022-03-20 MED ORDER — FENTANYL CITRATE (PF) 100 MCG/2ML IJ SOLN: 25.0000 ug | INTRAMUSCULAR | Status: DC | PRN

## 2022-03-20 MED ORDER — FENTANYL CITRATE (PF) 100 MCG/2ML IJ SOLN
INTRAMUSCULAR | Status: AC
  Filled 2022-03-20: qty 2

## 2022-03-20 MED ORDER — EPHEDRINE SULFATE (PRESSORS) 50 MG/ML IJ SOLN
INTRAMUSCULAR | Status: DC | PRN
  Administered 2022-03-20: 5 mg via INTRAVENOUS

## 2022-03-20 SURGICAL SUPPLY — 65 items
AID PSTN UNV HD RSTRNT DISP (MISCELLANEOUS) ×1
APL PRP STRL LF DISP 70% ISPRP (MISCELLANEOUS) ×2
BLADE EXCALIBUR 4.0X13 (MISCELLANEOUS) ×2 IMPLANT
BURR OVAL 8 FLU 4.0X13 (MISCELLANEOUS) IMPLANT
CANNULA 5.75X71 LONG (CANNULA) IMPLANT
CANNULA 7X7 TWIST-IN (CANNULA) ×1 IMPLANT
CANNULA PASSPORT 5 (CANNULA) IMPLANT
CANNULA PASSPORT BUTTON 10-40 (CANNULA) IMPLANT
CANNULA TWIST IN 8.25X7CM (CANNULA) IMPLANT
CHLORAPREP W/TINT 26 (MISCELLANEOUS) ×4 IMPLANT
COOLER ICEMAN CLASSIC (MISCELLANEOUS) ×2 IMPLANT
DRAPE IMP U-DRAPE 54X76 (DRAPES) ×2 IMPLANT
DRAPE INCISE IOBAN 66X45 STRL (DRAPES) ×2 IMPLANT
DRAPE SHOULDER BEACH CHAIR (DRAPES) ×2 IMPLANT
DRAPE U-SHAPE 47X51 STRL (DRAPES) ×4 IMPLANT
DRSG PAD ABDOMINAL 8X10 ST (GAUZE/BANDAGES/DRESSINGS) ×2 IMPLANT
DW OUTFLOW CASSETTE/TUBE SET (MISCELLANEOUS) ×2 IMPLANT
GAUZE SPONGE 4X4 12PLY STRL (GAUZE/BANDAGES/DRESSINGS) ×2 IMPLANT
GAUZE XEROFORM 1X8 LF (GAUZE/BANDAGES/DRESSINGS) ×2 IMPLANT
GLOVE BIO SURGEON STRL SZ 6 (GLOVE) ×4 IMPLANT
GLOVE BIO SURGEON STRL SZ7.5 (GLOVE) ×2 IMPLANT
GLOVE BIOGEL PI IND STRL 6.5 (GLOVE) ×1 IMPLANT
GLOVE BIOGEL PI IND STRL 8 (GLOVE) ×1 IMPLANT
GLOVE BIOGEL PI INDICATOR 6.5 (GLOVE) ×1
GLOVE BIOGEL PI INDICATOR 8 (GLOVE) ×1
GLOVE ECLIPSE 8.0 STRL XLNG CF (GLOVE) ×2 IMPLANT
GLOVE SURG SYN 7.5  E (GLOVE) ×2
GLOVE SURG SYN 7.5 E (GLOVE) ×1 IMPLANT
GLOVE SURG SYN 7.5 PF PI (GLOVE) ×1 IMPLANT
GOWN STRL REUS W/ TWL LRG LVL3 (GOWN DISPOSABLE) ×2 IMPLANT
GOWN STRL REUS W/ TWL XL LVL3 (GOWN DISPOSABLE) ×1 IMPLANT
GOWN STRL REUS W/TWL LRG LVL3 (GOWN DISPOSABLE) ×4
GOWN STRL REUS W/TWL XL LVL3 (GOWN DISPOSABLE) ×4 IMPLANT
KIT SHOULDER STAB MARCO (KITS) ×2 IMPLANT
KIT STR SPEAR 1.8 FBRTK DISP (KITS) IMPLANT
LASSO 90 CVE QUICKPAS (DISPOSABLE) IMPLANT
LASSO CRESCENT QUICKPASS (SUTURE) IMPLANT
MANIFOLD NEPTUNE II (INSTRUMENTS) ×2 IMPLANT
NDL SAFETY ECLIPSE 18X1.5 (NEEDLE) ×1 IMPLANT
NDL SCORPION MULTI FIRE (NEEDLE) IMPLANT
NEEDLE HYPO 18GX1.5 SHARP (NEEDLE) ×2
NEEDLE SCORPION MULTI FIRE (NEEDLE) IMPLANT
PACK ARTHROSCOPY DSU (CUSTOM PROCEDURE TRAY) ×2 IMPLANT
PACK BASIN DAY SURGERY FS (CUSTOM PROCEDURE TRAY) ×2 IMPLANT
PAD COLD SHLDR WRAP-ON (PAD) ×3 IMPLANT
PORT APPOLLO RF 90DEGREE MULTI (SURGICAL WAND) ×2 IMPLANT
RESTRAINT HEAD UNIVERSAL NS (MISCELLANEOUS) ×2 IMPLANT
SHEET MEDIUM DRAPE 40X70 STRL (DRAPES) ×1 IMPLANT
SLEEVE SCD COMPRESS KNEE MED (STOCKING) ×2 IMPLANT
SLING ULTRA III MED (ORTHOPEDIC SUPPLIES) ×1 IMPLANT
SUT ETHILON 3 0 PS 1 (SUTURE) ×2 IMPLANT
SUT FIBERWIRE #2 38 T-5 BLUE (SUTURE)
SUT PDS AB 1 CT  36 (SUTURE)
SUT PDS AB 1 CT 36 (SUTURE) IMPLANT
SUT TIGER TAPE 7 IN WHITE (SUTURE) IMPLANT
SUTURE FIBERWR #2 38 T-5 BLUE (SUTURE) IMPLANT
SUTURE TAPE 1.3 40 TPR END (SUTURE) IMPLANT
SUTURE TAPE TIGERLINK 1.3MM BL (SUTURE) IMPLANT
SUTURETAPE 1.3 40 TPR END (SUTURE)
SUTURETAPE TIGERLINK 1.3MM BL (SUTURE)
SYR 5ML LL (SYRINGE) ×2 IMPLANT
TAPE FIBER 2MM 7IN #2 BLUE (SUTURE) IMPLANT
TOWEL GREEN STERILE FF (TOWEL DISPOSABLE) ×4 IMPLANT
TUBE CONNECTING 20X1/4 (TUBING) ×2 IMPLANT
TUBING ARTHROSCOPY IRRIG 16FT (MISCELLANEOUS) ×2 IMPLANT

## 2022-03-20 NOTE — Discharge Instructions (Addendum)
Discharge Instructions    Attending Surgeon: Vanetta Mulders, MD Office Phone Number: [phone]   Diagnosis and Procedures:    Surgeries Performed: Right shoulder debridement with [facility] joint resection  Discharge Plan:    Diet: Resume usual diet. Begin with light or bland foods.  Drink plenty of fluids.  Activity:  You may begin to come out of your sling once your block wears off and use your arm as tolerated You are advised to go home directly from the hospital or surgical center. Restrict your activities.  GENERAL INSTRUCTIONS: 1.  Keep your surgical site elevated above your heart for at least 5-7 days or longer to prevent swelling. This will improve your comfort and your overall recovery following surgery.     2. Please call Dr. Eddie Dibbles office at [phone] with questions Monday-Friday during business hours. If no one answers, please leave a message and someone should get back to the patient within 24 hours. For emergencies please call 911 or proceed to the emergency room.   3. Patient to notify surgical team if experiences any of the following: Bowel/Bladder dysfunction, uncontrolled pain, nerve/muscle weakness, incision with increased drainage or redness, nausea/vomiting and Fever greater than 101.0 F.  Be alert for signs of infection including redness, streaking, odor, fever or chills. Be alert for excessive pain or bleeding and notify your surgeon immediately.  WOUND INSTRUCTIONS:   Leave your dressing/cast/splint in place until your post operative visit.  Keep it clean and dry.  Always keep the incision clean and dry until the staples/sutures are removed. If there is no drainage from the incision you should keep it open to air. If there is drainage from the incision you must keep it covered at all times until the drainage stops  Do not soak in a bath tub, hot tub, pool, lake or other body of water until 21 days after your surgery and your incision is completely  dry and healed.  If you have removable sutures (or staples) they must be removed 10-14 days (unless otherwise instructed) from the day of your surgery.     1)  Elevate the extremity as much as possible.  2)  Keep the dressing clean and dry.  3)  Please call us if the dressing becomes wet or dirty.  4)  If you are experiencing worsening pain or worsening swelling, please call.     MEDICATIONS: Resume all previous home medications at the previous prescribed dose and frequency unless otherwise noted Start taking the  pain medications on an as-needed basis as prescribed  Please taper down pain medication over the next week following surgery.  Ideally you should not require a refill of any narcotic pain medication.  Take pain medication with food to minimize nausea. In addition to the prescribed pain medication, you may take over-the-counter pain relievers such as Tylenol.  Do NOT take additional tylenol if your pain medication already has tylenol in it.  Aspirin '325mg'$  daily for four weeks.      FOLLOWUP INSTRUCTIONS: 1. Follow up at the Physical Therapy Clinic 3-4 days following surgery. This appointment should be scheduled unless other arrangements have been made.The Physical Therapy scheduling number is [phone] if an appointment has not already been arranged.  2. Contact Dr. Eddie Dibbles office during office hours at [phone] or the practice after hours line at [phone] for non-emergencies. For medical emergencies call 911.   Discharge Location: Home    Post Anesthesia Home Care Instructions  Activity: Get plenty  of rest for the remainder of the day. A responsible individual must stay with you for 24 hours following the procedure.  For the next 24 hours, DO NOT: -Drive a car -Paediatric nurse -Drink alcoholic beverages -Take any medication unless instructed by your physician -Make any legal decisions or sign important papers.  Meals: Start with liquid foods such as  gelatin or soup. Progress to regular foods as tolerated. Avoid greasy, spicy, heavy foods. If nausea and/or vomiting occur, drink only clear liquids until the nausea and/or vomiting subsides. Call your physician if vomiting continues.  Special Instructions/Symptoms: Your throat may feel dry or sore from the anesthesia or the breathing tube placed in your throat during surgery. If this causes discomfort, gargle with warm salt water. The discomfort should disappear within 24 hours.  If you had a scopolamine patch placed behind your ear for the management of post- operative nausea and/or vomiting:  1. The medication in the patch is effective for 72 hours, after which it should be removed.  Wrap patch in a tissue and discard in the trash. Wash hands thoroughly with soap and water. 2. You may remove the patch earlier than 72 hours if you experience unpleasant side effects which may include dry mouth, dizziness or visual disturbances. 3. Avoid touching the patch. Wash your hands with soap and water after contact with the patch.     Post Anesthesia Home Care Instructions  Activity: Get plenty of rest for the remainder of the day. A responsible individual must stay with you for 24 hours following the procedure.  For the next 24 hours, DO NOT: -Drive a car -Paediatric nurse -Drink alcoholic beverages -Take any medication unless instructed by your physician -Make any legal decisions or sign important papers.  Meals: Start with liquid foods such as gelatin or soup. Progress to regular foods as tolerated. Avoid greasy, spicy, heavy foods. If nausea and/or vomiting occur, drink only clear liquids until the nausea and/or vomiting subsides. Call your physician if vomiting continues.  Special Instructions/Symptoms: Your throat may feel dry or sore from the anesthesia or the breathing tube placed in your throat during surgery. If this causes discomfort, gargle with warm salt water. The discomfort should  disappear within 24 hours.  If you had a scopolamine patch placed behind your ear for the management of post- operative nausea and/or vomiting:  1. The medication in the patch is effective for 72 hours, after which it should be removed.  Wrap patch in a tissue and discard in the trash. Wash hands thoroughly with soap and water. 2. You may remove the patch earlier than 72 hours if you experience unpleasant side effects which may include dry mouth, dizziness or visual disturbances. 3. Avoid touching the patch. Wash your hands with soap and water after contact with the patch.  Regional Anesthesia Blocks  1. Numbness or the inability to move the "blocked" extremity may last from 3-48 hours after placement. The length of time depends on the medication injected and your individual response to the medication. If the numbness is not going away after 48 hours, call your surgeon.  2. The extremity that is blocked will need to be protected until the numbness is gone and the  Strength has returned. Because you cannot feel it, you will need to take extra care to avoid injury. Because it may be weak, you may have difficulty moving it or using it. You may not know what position it is in without looking at it while the block  is in effect.  3. For blocks in the legs and feet, returning to weight bearing and walking needs to be done carefully. You will need to wait until the numbness is entirely gone and the strength has returned. You should be able to move your leg and foot normally before you try and bear weight or walk. You will need someone to be with you when you first try to ensure you do not fall and possibly risk injury.  4. Bruising and tenderness at the needle site are common side effects and will resolve in a few days.  5. Persistent numbness or new problems with movement should be communicated to the surgeon or the [place] [phone] [place] [phone]).    Information for Discharge Teaching:  EXPAREL (bupivacaine liposome injectable suspension)   Your surgeon or anesthesiologist gave you EXPAREL(bupivacaine) to help control your pain after surgery.  EXPAREL is a local anesthetic that provides pain relief by numbing the tissue around the surgical site. EXPAREL is designed to release pain medication over time and can control pain for up to 72 hours. Depending on how you respond to EXPAREL, you may require less pain medication during your recovery.  Possible side effects: Temporary loss of sensation or ability to move in the area where bupivacaine was injected. Nausea, vomiting, constipation Rarely, numbness and tingling in your mouth or lips, lightheadedness, or anxiety may occur. Call your doctor right away if you think you may be experiencing any of these sensations, or if you have other questions regarding possible side effects.  Follow all other discharge instructions given to you by your surgeon or nurse. Eat a healthy diet and drink plenty of water or other fluids.  If you return to the hospital for any reason within 96 hours following the administration of EXPAREL, it is important for health care providers to know that you have received this anesthetic. A teal colored band has been placed on your arm with the date, time and amount of EXPAREL you have received in order to alert and inform your health care providers. Please leave this armband in place for the full 96 hours following administration, and then you may remove the band.   *May have Tylenol after 12:30pm today 03/20/22

## 2022-03-20 NOTE — Progress Notes (Signed)
Assisted Dr. Howze with right, interscalene , ultrasound guided block. Side rails up, monitors on throughout procedure. See vital signs in flow sheet. Tolerated Procedure well. 

## 2022-03-20 NOTE — Anesthesia Procedure Notes (Signed)
Procedure Name: Intubation Date/Time: 03/20/2022 7:52 AM  Performed by: Lavonia Dana, CRNAPre-anesthesia Checklist: Patient identified, Emergency Drugs available, Suction available and Patient being monitored Patient Re-evaluated:Patient Re-evaluated prior to induction Oxygen Delivery Method: Circle system utilized Preoxygenation: Pre-oxygenation with 100% oxygen Induction Type: IV induction Ventilation: Mask ventilation without difficulty Laryngoscope Size: Mac and 3 Grade View: Grade I Tube type: Oral Tube size: 7.0 mm Number of attempts: 1 Airway Equipment and Method: Stylet and Bite block Placement Confirmation: ETT inserted through vocal cords under direct vision, positive ETCO2 and breath sounds checked- equal and bilateral Secured at: 22 cm Tube secured with: Tape Dental Injury: Teeth and Oropharynx as per pre-operative assessment

## 2022-03-20 NOTE — Brief Op Note (Signed)
   Brief Op Note  Date of Surgery: 03/20/2022  Preoperative Diagnosis: RIGHT ACROMIOCLAVICULAR OSTEOARTHRITIS  Postoperative Diagnosis: same  Procedure: Procedure(s): RIGHT SHOULDER ARTHROSCOPY WITH DISTAL CLAVICLE RESECTION  Implants: * No implants in log *  Surgeons: Surgeon(s): Vanetta Mulders, MD  Anesthesia: Regional    Estimated Blood Loss: See anesthesia record  Complications: None  Condition to PACU: Stable  Yevonne Pax, MD 03/20/2022 9:04 AM

## 2022-03-20 NOTE — Transfer of Care (Signed)
Immediate Anesthesia Transfer of Care Note  Patient: Samantha Becker  Procedure(s) Performed: RIGHT SHOULDER ARTHROSCOPY WITH DISTAL CLAVICLE RESECTION (Right: Shoulder)  Patient Location: PACU  Anesthesia Type:GA combined with regional for post-op pain  Level of Consciousness: drowsy  Airway & Oxygen Therapy: Patient Spontanous Breathing and Patient connected to face mask oxygen  Post-op Assessment: Report given to RN and Post -op Vital signs reviewed and stable  Post vital signs: Reviewed and stable  Last Vitals:  Vitals Value Taken Time  BP 148/86 03/20/22 0917  Temp    Pulse 72 03/20/22 0918  Resp 24 03/20/22 0918  SpO2 99 % 03/20/22 0918  Vitals shown include unvalidated device data.  Last Pain:  Vitals:   03/20/22 0633  TempSrc: Oral  PainSc: 6       Patients Stated Pain Goal: 3 (61/22/44 9753)  Complications: No notable events documented.

## 2022-03-20 NOTE — H&P (Signed)
Chief Complaint: Right shoulder pain        History of Present Illness:      Samantha Becker is a 66 y.o. female right-hand-dominant female presents with ongoing right AC pain for many years now.  She has previously undergone physical therapy for many months in the right shoulder which did temporarily relieve her pain although this has been back in aggravating her since January of this year.  She has seen Dr. Georgina Snell in the past who provided a ultrasound-guided injection of the right [facility] joint.  This provided 6 weeks of excellent relief from the pain although this is subsequently required.  She has majority of her pain with cross body motions and she does experience some popping and clicking in the [facility] joint.  She enjoys gardening in her spare time although this has been limited by her shoulder pain.  She is here today for further discussion and treatment.       Surgical History:   None   PMH/PSH/Family History/Social History/Meds/Allergies:         Past Medical History:  Diagnosis Date   Arthritis     Blood type, Rh negative     Chicken pox     GERD (gastroesophageal reflux disease)     Leaking of urine     Low iron     Mumps           Past Surgical History:  Procedure Laterality Date   CESAREAN SECTION        4 cs   LAPAROSCOPY   03/04/2012    Procedure: LAPAROSCOPY OPERATIVE;  Surgeon: Eldred Manges, MD;  Location: [place] [facility];  Service: Gynecology;  Laterality: N/A;  Peritoneum Biopsy   TUBAL LIGATION Bilateral      Social History         Socioeconomic History   Marital status: Married      Spouse name: Not on file   Number of children: Not on file   Years of education: Not on file   Highest education level: Not on file  Occupational History   Occupation: TEACHER       Employer: [employer]  Tobacco Use   Smoking status: Never   Smokeless tobacco: Never  Substance and Sexual Activity   Alcohol use: Yes      Alcohol/week: 4.0 standard drinks of  alcohol      Types: 2 Glasses of wine, 2 Cans of beer per week   Drug use: No   Sexual activity: Yes  Other Topics Concern   Not on file  Social History Narrative    Prior Pharmacist, hospital at [employer], 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    Social Determinants of Health    Financial Resource Strain: Not on file  Food Insecurity: Not on file  Transportation Needs: Not on file  Physical Activity: Not on file  Stress: Not on file  Social Connections: Not on file         Family History  Problem Relation Age of Onset   Heart disease Father     Hypertension Father     Cancer Father     Stroke Father     Heart disease Mother     Hypertension Mother     Arthritis Mother     Breast cancer Mother     Osteoporosis Mother     Arthritis Sister     Asthma Sister      No Known Allergies  Current Outpatient Medications  Medication Sig Dispense Refill   aspirin EC 81 MG tablet Take 1 tablet (81 mg total) by mouth daily. 30 tablet 3   conjugated estrogens (PREMARIN) vaginal cream Premarin 0.625 mg/gram vaginal cream  Insert 0.5 applicatorsful twice a week by vaginal route. 42.5 g 2   diclofenac Sodium (VOLTAREN) 1 % GEL Apply 2 g topically as needed.       fluticasone (FLONASE) 50 MCG/ACT nasal spray Place 1 spray into both nostrils as needed for allergies or rhinitis.        No current facility-administered medications for this visit.    Imaging Results (Last 48 hours)  No results found.     Review of Systems:   A ROS was performed including pertinent positives and negatives as documented in the HPI.   Physical Exam :   Constitutional: NAD and appears stated age Neurological: Alert and oriented Psych: Appropriate affect and cooperative There were no vitals taken for this visit.    Comprehensive Musculoskeletal Exam:     Musculoskeletal Exam      Inspection Right Left  Skin No atrophy or winging No atrophy or winging  Palpation      Tenderness Right AC joint None  Range of Motion       Flexion (passive) 170 170  Flexion (active) 170 170  Abduction 170 170  ER at the side 70 70  Can reach behind back to T12 T12  Strength        Full Full  Special Tests      Pseudoparalytic No No  Neurologic      Fires PIN, radial, median, ulnar, musculocutaneous, axillary, suprascapular, long thoracic, and spinal accessory innervated muscles. No abnormal sensibility  Vascular/Lymphatic      Radial Pulse 2+ 2+  Cervical Exam      Patient has symmetric cervical range of motion with negative Spurling's test.  Special Test: Positive crepitus about the [facility] joint with cross body abduction        Imaging:   Xray (right shoulder 3 views): Significant AC joint osteoarthritis with otherwise normal-appearing shoulder   MRI (right shoulder): Significant AC joint osteoarthritis   I personally reviewed and interpreted the radiographs.     Assessment:   66 y.o. female right-hand-dominant with symptomatic right AC joint osteoarthritis which has been bothering her for several years now.  She is at this time failed a trial of physical therapy and has also had an injection into the right [facility] joint which provided 6 weeks of very good relief.  That being said, this has now recurred and she is wishing to discuss possible options.  I did describe that while another injection could be helpful these typically last for shorter duration of time with subsequent injections.  That effect we did discuss arthroscopic surgery for distal clavicle resection.  She understands this and would like a more permanent solution as she is continuing to experience pain with essentially most overhead activities or anything that requires cross body motion.  After discussion of risks and benefits she has elected for right shoulder arthroscopy with distal clavicle resection.   Plan :     -Plan for right shoulder arthroscopy with distal clavicle resection     After a lengthy discussion of treatment options, including risks,  benefits, alternatives, complications of surgical and nonsurgical conservative options, the patient elected surgical repair.    The patient  is aware of the material risks  and complications including, but not limited to  injury to adjacent structures, neurovascular injury, infection, numbness, bleeding, implant failure, thermal burns, stiffness, persistent pain, failure to heal, disease transmission from allograft, need for further surgery, dislocation, anesthetic risks, blood clots, risks of death,and others. The probabilities of surgical success and failure discussed with patient given their particular co-morbidities.The time and nature of expected rehabilitation and recovery was discussed.The patient's questions were all answered preoperatively.  No barriers to understanding were noted. I explained the natural history of the disease process and Rx rationale.  I explained to the patient what I considered to be reasonable expectations given their personal situation.  The final treatment plan was arrived at through a shared patient decision making process model.           I personally saw and evaluated the patient, and participated in the management and treatment plan.   Vanetta Mulders, MD Attending Physician, Orthopedic Surgery   This document was dictated using Dragon voice recognition software. A reasonable attempt at proof reading has been made to minimize errors.

## 2022-03-20 NOTE — Anesthesia Procedure Notes (Signed)
Anesthesia Regional Block: Interscalene brachial plexus block   Pre-Anesthetic Checklist: , timeout performed,  Correct Patient, Correct Site, Correct Laterality,  Correct Procedure, Correct Position, site marked,  Risks and benefits discussed,  Pre-op evaluation,  At surgeon's request and post-op pain management  Laterality: Right  Prep: Maximum Sterile Barrier Precautions used, chloraprep       Needles:  Injection technique: Single-shot  Needle Type: Echogenic Stimulator Needle     Needle Length: 4cm  Needle Gauge: 22     Additional Needles:   Procedures:,,,, ultrasound used (permanent image in chart),,    Narrative:  Start time: 03/20/2022 6:54 AM End time: 03/20/2022 6:57 AM Injection made incrementally with aspirations every 5 mL.  Performed by: Personally  Anesthesiologist: Brennan Bailey, MD  Additional Notes: Risks, benefits, and alternative discussed. Patient gave consent for procedure. Patient prepped and draped in sterile fashion. Sedation administered, patient remains easily responsive to voice. Relevant anatomy identified with ultrasound guidance. Local anesthetic given in 5cc increments with no signs or symptoms of intravascular injection. No pain or paraesthesias with injection. Patient monitored throughout procedure with signs of LAST or immediate complications. Tolerated well. Ultrasound image placed in chart.  Tawny Asal, MD

## 2022-03-20 NOTE — Interval H&P Note (Signed)
History and Physical Interval Note:  03/20/2022 7:08 AM  Samantha Becker  has presented today for surgery, with the diagnosis of RIGHT ACROMIOCLAVICULAR OSTEOARTHRITIS.  The various methods of treatment have been discussed with the patient and family. After consideration of risks, benefits and other options for treatment, the patient has consented to  Procedure(s): RIGHT SHOULDER ARTHROSCOPY WITH DISTAL CLAVICLE RESECTION (Right) as a surgical intervention.  The patient's history has been reviewed, patient examined, no change in status, stable for surgery.  I have reviewed the patient's chart and labs.  Questions were answered to the patient's satisfaction.     Vanetta Mulders

## 2022-03-20 NOTE — Anesthesia Postprocedure Evaluation (Signed)
Anesthesia Post Note  Patient: Samantha Becker  Procedure(s) Performed: RIGHT SHOULDER ARTHROSCOPY WITH DISTAL CLAVICLE RESECTION (Right: Shoulder)     Patient location during evaluation: PACU Anesthesia Type: General Level of consciousness: awake and alert Pain management: pain level controlled Vital Signs Assessment: post-procedure vital signs reviewed and stable Respiratory status: spontaneous breathing, nonlabored ventilation and respiratory function stable Cardiovascular status: blood pressure returned to baseline Postop Assessment: no apparent nausea or vomiting Anesthetic complications: no   No notable events documented.  Last Vitals:  Vitals:   03/20/22 1000 03/20/22 1015  BP: (!) 150/82 (!) 155/86  Pulse: 71 67  Resp: 16 20  Temp:    SpO2: 93% 93%    Last Pain:  Vitals:   03/20/22 1015  TempSrc:   PainSc: 0-No pain                 Marthenia Rolling

## 2022-03-20 NOTE — Op Note (Signed)
Date of Surgery: 03/20/2022  INDICATIONS: Samantha Becker is a 66 y.o.-year-old female with shoulder symptomatic AC joint osteoarthritis which is failed physical therapy as well as a previous injection.  She is elected for operative debridement at this point.  The risk and benefits of the procedure were discussed in detail and documented in the pre-operative evaluation.   PREOPERATIVE DIAGNOSES: Right shoulder, AC arthritis and subacromial impingement.  POSTOPERATIVE DIAGNOSIS: Same.  PROCEDURE: Arthroscopic limited debridement - 40814 Arthroscopic distal clavicle excision - 48185 Arthroscopic subacromial decompression - 63149  SURGEON: Yevonne Pax MD  ASSISTANT: Raynelle Fanning, ATC  ANESTHESIA:  general  IV FLUIDS AND URINE: See anesthesia record.  ANTIBIOTICS: Ancef  ESTIMATED BLOOD LOSS: 5 mL.  IMPLANTS:  * No implants in log *  DRAINS: None  CULTURES: None  COMPLICATIONS: none  PROCEDURE:    OPERATIVE FINDING: Exam under anesthesia:   Examination under anesthesia revealed forward elevation of 150 degrees.  With the arm at the side, there was 65 degrees of external rotation.  There is a 1+ anterior load shift and a 1+ posterior load shift.    Arthroscopic findings demonstrated: Articular space: Normal Chondral surfaces: Normal Biceps: Normal Subscapularis: Intact Supraspinatus: Normal Infraspinatus: Normal Subacromial space: Spurring at the [facility] joint as well as a impinging osteophyte under the acromion    I identified the patient in the pre-operative holding area.  I marked the operative right shoulder with my initials. I reviewed the risks and benefits of the proposed surgical intervention and the patient wished to proceed.  Anesthesia was then performed with regional block.  The patient was transferred to the operative suite and placed in the beach chair position with all bony prominences padded.     SCDs were placed on bilateral lower extremity.  Appropriate antibiotics was administered within 1 hour before incision.  Anesthesia was induced.  The operative extremity was then prepped and draped in standard fashion. A time out was performed confirming the correct extremity, correct patient and correct procedure.   The arthroscope was introduced in the glenohumeral joint from a posterior portal.  An anterior portal was created.  The shoulder was examined and the above findings were noted.     With an arthroscopic shaver and a wand ablator, synovitis throughout the  shoulder was resected.  The arthroscopic shaver was used to excise torn portions of the labrum back to a stable margin. Specifically this was done for the anterior anterior cervical and posterior labrum.  At this time questions felt the camera was introduced to the subacromial space.  Electrocautery was used to outline the undersurface of the acromion.  A shaver was introduced into the anterior portal and used to debride the undersurface of the acromion and remove impinging osteophyte.  This was done in the subacromial space.  Electrocautery was then used to outline the undersurface of the [facility] joint.  Direct manual pressure on the clavicle was performed in order to visualize and get access to the [facility] joint.  The lateral portion of the acromion was then debrided until the probe could fit nicely into the [facility] joint space.  This was done symmetrically with care not to over resect the proximal AC stabilizers.  Following this there was equal symmetric spacing of the [facility] joint with removal of spurring.   The shoulder was irrigated.  The arthroscopic instruments were removed.  Wounds were closed with 3-0 nylon sutures.  A sterile dressing was applied with xeroform, 4x8s, abdominal pad, and tape. An  Iceman was placed and the upper extremity was placed in a shoulder immobilizer.  The patient tolerated the procedure well and was taken to the recovery room in stable condition.  All counts were correct in the  case.  The patient tolerated the procedure well and was taken to the recovery room in stable condition.    POSTOPERATIVE PLAN: She will be in her sling until her block wears off.  At that time she may come out of this and progress her activity and motion as tolerated.  I will see her back in 2 weeks for suture removal.  Yevonne Pax, MD 9:04 AM

## 2022-03-21 ENCOUNTER — Encounter (HOSPITAL_BASED_OUTPATIENT_CLINIC_OR_DEPARTMENT_OTHER): Payer: Self-pay | Admitting: Orthopaedic Surgery

## 2022-03-21 NOTE — Progress Notes (Signed)
Left message stating courtesy call and if any questions or concerns please call the doctors office.  

## 2022-03-23 ENCOUNTER — Encounter: Payer: Self-pay | Admitting: Physical Therapy

## 2022-03-23 ENCOUNTER — Ambulatory Visit (INDEPENDENT_AMBULATORY_CARE_PROVIDER_SITE_OTHER): Payer: 59 | Admitting: Physical Therapy

## 2022-03-23 DIAGNOSIS — M25551 Pain in right hip: Secondary | ICD-10-CM | POA: Diagnosis not present

## 2022-03-23 NOTE — Therapy (Signed)
OUTPATIENT PHYSICAL THERAPY LOWER EXTREMITY TREATMENT/Re-Eval   Patient Name: Samantha Becker MRN: [id number] DOB:Jan 06, 1956, 66 y.o., female Today's Date: 03/23/2022   PT End of Session - 03/23/22 1348     Visit Number 6    Number of Visits 20    Date for PT Re-Evaluation 05/04/22    Authorization Type [employer],  Re-eval  done at visit 6    PT Start Time 0934    PT Stop Time 1012    PT Time Calculation (min) 38 min    Activity Tolerance Patient tolerated treatment well    Behavior During Therapy WFL for tasks assessed/performed                 Past Medical History:  Diagnosis Date   Arthritis    Chicken pox    GERD (gastroesophageal reflux disease)    OTC   Leaking of urine    Low iron    Mumps    Past Surgical History:  Procedure Laterality Date   CESAREAN SECTION     4 cs   LAPAROSCOPY  03/04/2012   Procedure: LAPAROSCOPY OPERATIVE;  Surgeon: Eldred Manges, MD;  Location: [place] [facility];  Service: Gynecology;  Laterality: N/A;  Peritoneum Biopsy   SHOULDER ARTHROSCOPY Right 03/20/2022   Procedure: RIGHT SHOULDER ARTHROSCOPY WITH DISTAL CLAVICLE RESECTION;  Surgeon: Vanetta Mulders, MD;  Location: [place];  Service: Orthopedics;  Laterality: Right;   TUBAL LIGATION Bilateral    Patient Active Problem List   Diagnosis Date Noted   Osteoarthritis of AC (acromioclavicular) joint    Endolymphatic hydrops of right ear 08/18/2020   Right-sided sensorineural hearing loss 08/18/2020   Atrophic vaginitis 03/20/2017   Endometriosis 03/18/2012   Dyspareunia 02/01/2012    PCP: Inda Coke  REFERRING PROVIDER: Ellard Artis corey/ Vanetta Mulders  REFERRING DIAG: R hip pain/ R shoulder pain/ S/P DCE  THERAPY DIAG:  Pain in right hip  Rationale for Evaluation and Treatment Rehabilitation  ONSET DATE:   SUBJECTIVE:   SUBJECTIVE STATEMENT: 03/23/2022 Re-Eval Today, for change in status, pt had R shoulder surgery on 7/25, for R shoulder arthroscopy,  distal clavicle excision for [facility] joint arthritis.  She is R handed.  Taking tylenol, was able to sleep in bed. Doing very well.   Still being seen in PT for R hip, reporting improvements .     PERTINENT HISTORY:   PAIN:  Are you having pain? Yes: NPRS scale: 4/10 Pain location: R hip  Pain description: Sore Aggravating factors: late at night, and early AM.  Relieving factors: movement, walking.   Are you having pain? Yes: NPRS scale: 3/10 Pain location: R shoulder  Pain description: Sore Aggravating factors:   movement  Relieving factors: ice  PRECAUTIONS: None  WEIGHT BEARING RESTRICTIONS No  FALLS:  Has patient fallen in last 6 months? No  PLOF: Independent  PATIENT GOALS   decreased pain in R hip. And R shoulder, return to regular use of R arm.     OBJECTIVE: updated 7/28  DIAGNOSTIC FINDINGS: no imaging.    COGNITION:  Overall cognitive status: Within functional limits for tasks assessed      POSTURE: No Significant postural limitations  PALPATION: Tenderness in Gr troch, into R glute med, min, piriformis.    LOWER EXTREMITY ROM  Lumbar: WFL,   Hips: WFL   Shoulder PROM: flex: 90 deg, abd: 90 deg,  IR: 40 , ER: 35 deg   Shoulder AROM: not tested.   LOWER EXTREMITY MMT:  Hips: flex: 4+/5, abd: 4+/5  Shoulder: not tested.   LOWER EXTREMITY SPECIAL TESTS:  Nadara Mustard,     TODAY'S TREATMENT:  03/23/2022 Re Eval for R shoulder Ther ex: supine flexion AAROM/ 2 hand hold 2 x 8;  Standing: elbow flex/ext  x 10,  pendulum L/R and fwd/bwd x 10 ea;  Seated: wrist flex/ext x 10,  hand grip motion x 10;     . PATIENT EDUCATION:  Education details: Reviewed HEP, PT POC, implications and precautions for R shoulder, sling use.  Person educated: Patient Education method: Explanation, Demonstration, Tactile cues, Verbal cues, and Handouts Education comprehension: verbalized understanding, returned demonstration, verbal cues required, tactile cues  required, and needs further education   HOME EXERCISE PROGRAM: Access Code: [email or web address]   - hip Access Code: [email or web address] URL: [email or web address] Date: 03/23/2022 Prepared by: Lyndee Hensen  Exercises - Flexion-Extension Shoulder Pendulum with Table Support  - 2-3 x daily - 5 reps - 10 hold - Supine Shoulder Flexion AAROM  - 1- 2 x daily - 10 reps - 3 hold - Standing Elbow Flexion Extension AROM  - 2 x daily - 5- 10 reps - Hand AROM Composite Flexion  - 1 x daily - 1 sets - 10 reps    ASSESSMENT:  CLINICAL IMPRESSION: 03/23/2022 ReEval for R shoulder today:  Pt doing very well post op. She has mild soreness in shoulder and distal clavicle region. She has decreased ability for full ROM and full functional activities due to pain and deficits. Pt to benefit from skilled PT to restore functional use of UE. Hip pain improving, will benefit from continued strength and stabilization to meet LTGs.    OBJECTIVE IMPAIRMENTS decreased activity tolerance, decreased knowledge of use of DME, decreased mobility, decreased ROM, decreased strength, increased muscle spasms, improper body mechanics, and pain.   ACTIVITY LIMITATIONS carrying, lifting, bending, standing, squatting, stairs, dressing, reach over head, and locomotion level  PARTICIPATION LIMITATIONS: meal prep, cleaning, laundry, driving, shopping, community activity, and yard work  PERSONAL FACTORS Time since onset of injury/illness/exacerbation are also affecting patient's functional outcome.   REHAB POTENTIAL: Good  CLINICAL DECISION MAKING: Stable/uncomplicated  EVALUATION COMPLEXITY: Low   GOALS: Goals reviewed with patient? Yes  SHORT TERM GOALS: Target date:  03/01/22  Pt to be independent with initial HEP  Goal status: MET   2.  Pt to report decrease in pain in glute muscles and soft tissue by at least 50%, for decreased pain.   Goal status: MET    LONG TERM GOALS: Target date: 05/04/22  Pt to be  independent with final HEP  Goal status: IN PROGRESS  2.  Pt to report decreased pain in R hip and glute to 0-2/10 with activity.  Goal status: IN PROGRESS  3.  Pt to demo improved strength of bil hips to at least 4+/5 to improve stability and pain.   Goal status: IN PROGRESS  4.  Pt to demo ability for standing/walking activity for at least 30 min without increased pain, to improve ability for community activity.    Goal status: IN PROGRESS  5. PT to demo ability for full shoulder AROM without pain, to improve ability for ADLS.   Goal status: Initial   6. Pt to demo improved strength of R shoulder to at least 4+/5 to improve ability for IADLS.   Goal status: initial    PLAN: PT FREQUENCY: 1-2x/week  PT DURATION: 6 weeks  PLANNED INTERVENTIONS: Therapeutic exercises, Therapeutic activity, Neuromuscular re-education, Gait training,  Patient/Family education, Joint manipulation, Joint mobilization, Stair training, DME instructions, Dry Needling, Electrical stimulation, Spinal manipulation, Spinal mobilization, Cryotherapy, Moist heat, Taping, Traction, Ultrasound, Ionotophoresis 47m/ml Dexamethasone, and Manual therapy  PLAN FOR NEXT SESSION:   LLyndee Hensen PT, DPT 1:50 PM  03/23/22

## 2022-03-27 ENCOUNTER — Ambulatory Visit (INDEPENDENT_AMBULATORY_CARE_PROVIDER_SITE_OTHER): Payer: 59 | Admitting: Physical Therapy

## 2022-03-27 ENCOUNTER — Encounter: Payer: Self-pay | Admitting: Physical Therapy

## 2022-03-27 DIAGNOSIS — M25551 Pain in right hip: Secondary | ICD-10-CM | POA: Diagnosis not present

## 2022-03-27 NOTE — Therapy (Signed)
OUTPATIENT PHYSICAL THERAPY TREATMENT   Patient Name: Samantha Becker MRN: [id number] DOB:1955/11/24, 66 y.o., female Today's Date: 03/27/2022   PT End of Session - 03/27/22 0947     Visit Number 7    Number of Visits 20    Date for PT Re-Evaluation 05/04/22    Authorization Type U[employer],  Re-eval  done at visit 6    PT Start Time 0848    PT Stop Time 0928    PT Time Calculation (min) 40 min    Activity Tolerance Patient tolerated treatment well    Behavior During Therapy WFL for tasks assessed/performed                  Past Medical History:  Diagnosis Date   Arthritis    Chicken pox    GERD (gastroesophageal reflux disease)    OTC   Leaking of urine    Low iron    Mumps    Past Surgical History:  Procedure Laterality Date   CESAREAN SECTION     4 cs   LAPAROSCOPY  03/04/2012   Procedure: LAPAROSCOPY OPERATIVE;  Surgeon: Eldred Manges, MD;  Location: [facility];  Service: Gynecology;  Laterality: N/A;  Peritoneum Biopsy   SHOULDER ARTHROSCOPY Right 03/20/2022   Procedure: RIGHT SHOULDER ARTHROSCOPY WITH DISTAL CLAVICLE RESECTION;  Surgeon: Vanetta Mulders, MD;  Location: [place];  Service: Orthopedics;  Laterality: Right;   TUBAL LIGATION Bilateral    Patient Active Problem List   Diagnosis Date Noted   Osteoarthritis of AC (acromioclavicular) joint    Endolymphatic hydrops of right ear 08/18/2020   Right-sided sensorineural hearing loss 08/18/2020   Atrophic vaginitis 03/20/2017   Endometriosis 03/18/2012   Dyspareunia 02/01/2012    PCP: Inda Coke  REFERRING PROVIDER: Ellard Artis corey/ Vanetta Mulders  REFERRING DIAG: R hip pain/ R shoulder pain/ S/P DCE  THERAPY DIAG:  Pain in right hip  Rationale for Evaluation and Treatment Rehabilitation  ONSET DATE:   SUBJECTIVE:   SUBJECTIVE STATEMENT: 03/27/2022 Pt more sore today, did not sleep well last night.    PERTINENT HISTORY:   PAIN:  Are you having pain? Yes: NPRS  scale: 4/10 Pain location: R hip  Pain description: Sore Aggravating factors: late at night, and early AM.  Relieving factors: movement, walking.   Are you having pain? Yes: NPRS scale: 3/10 Pain location: R shoulder  Pain description: Sore Aggravating factors:   movement  Relieving factors: ice  PRECAUTIONS: None  WEIGHT BEARING RESTRICTIONS No  FALLS:  Has patient fallen in last 6 months? No  PLOF: Independent  PATIENT GOALS   decreased pain in R hip. And R shoulder, return to regular use of R arm.     OBJECTIVE: updated 7/28  DIAGNOSTIC FINDINGS: no imaging.    COGNITION:  Overall cognitive status: Within functional limits for tasks assessed      POSTURE: No Significant postural limitations  PALPATION: Tenderness in Gr troch, into R glute med, min, piriformis.    LOWER EXTREMITY ROM  Lumbar: WFL,   Hips: WFL   Shoulder PROM: flex: 90 deg, abd: 90 deg,  IR: 40 , ER: 35 deg   Shoulder AROM: not tested.   LOWER EXTREMITY MMT:  Hips: flex: 4+/5, abd: 4+/5  Shoulder: not tested.   LOWER EXTREMITY SPECIAL TESTS:  Nadara Mustard,     TODAY'S TREATMENT:  03/27/2022 Ther ex: supine flexion AAROM/ 2 hand hold 2 x 10; AROM ER/IR 2x10;   Standing: elbow  flex/ext  x 10,  pendulum L/R and fwd/bwd x 10 ea;  Seated: wrist flex/ext x 10,  hand grip /towel x 10; Pulley x 10 (mild pain) ; Cervical ROM/L/R rotation, UT stretch ;  Manual: PROM for R shoulder, all motions to tolerance.    Marland Kitchen PATIENT EDUCATION:  Education details: reviewed HEP, sling use.  Person educated: Patient Education method: Explanation, Demonstration, Tactile cues, Verbal cues, and Handouts Education comprehension: verbalized understanding, returned demonstration, verbal cues required, tactile cues required, and needs further education   HOME EXERCISE PROGRAM: Access Code: [email or web address]   - hip Access Code: [email or web address] - shoulder      ASSESSMENT:  CLINICAL IMPRESSION: 03/27/2022 Pt with  slightly more soreness today. She does have improved ability for PROM today with no pain. Reviewed ther ex appropriate at this time. Will continue to move arm as pain allows. Plan to progress AAROM and AROM as tolerated.    OBJECTIVE IMPAIRMENTS decreased activity tolerance, decreased knowledge of use of DME, decreased mobility, decreased ROM, decreased strength, increased muscle spasms, improper body mechanics, and pain.   ACTIVITY LIMITATIONS carrying, lifting, bending, standing, squatting, stairs, dressing, reach over head, and locomotion level  PARTICIPATION LIMITATIONS: meal prep, cleaning, laundry, driving, shopping, community activity, and yard work  PERSONAL FACTORS Time since onset of injury/illness/exacerbation are also affecting patient's functional outcome.   REHAB POTENTIAL: Good  CLINICAL DECISION MAKING: Stable/uncomplicated  EVALUATION COMPLEXITY: Low   GOALS: Goals reviewed with patient? Yes  SHORT TERM GOALS: Target date:  03/01/22  Pt to be independent with initial HEP  Goal status: MET   2.  Pt to report decrease in pain in glute muscles and soft tissue by at least 50%, for decreased pain.   Goal status: MET    LONG TERM GOALS: Target date: 05/04/22  Pt to be independent with final HEP  Goal status: IN PROGRESS  2.  Pt to report decreased pain in R hip and glute to 0-2/10 with activity.  Goal status: IN PROGRESS  3.  Pt to demo improved strength of bil hips to at least 4+/5 to improve stability and pain.   Goal status: IN PROGRESS  4.  Pt to demo ability for standing/walking activity for at least 30 min without increased pain, to improve ability for community activity.    Goal status: IN PROGRESS  5. PT to demo ability for full shoulder AROM without pain, to improve ability for ADLS.   Goal status: Initial   6. Pt to demo improved strength of R shoulder to at least 4+/5 to improve ability for IADLS.   Goal status: initial    PLAN: PT  FREQUENCY: 1-2x/week  PT DURATION: 6 weeks  PLANNED INTERVENTIONS: Therapeutic exercises, Therapeutic activity, Neuromuscular re-education, Gait training, Patient/Family education, Joint manipulation, Joint mobilization, Stair training, DME instructions, Dry Needling, Electrical stimulation, Spinal manipulation, Spinal mobilization, Cryotherapy, Moist heat, Taping, Traction, Ultrasound, Ionotophoresis 40m/ml Dexamethasone, and Manual therapy  PLAN FOR NEXT SESSION:   LLyndee Hensen PT, DPT 9:47 AM  03/27/22

## 2022-03-28 ENCOUNTER — Other Ambulatory Visit: Payer: Self-pay | Admitting: Physician Assistant

## 2022-03-28 DIAGNOSIS — Z1231 Encounter for screening mammogram for malignant neoplasm of breast: Secondary | ICD-10-CM

## 2022-03-29 ENCOUNTER — Encounter: Payer: Self-pay | Admitting: Physical Therapy

## 2022-03-29 ENCOUNTER — Ambulatory Visit (INDEPENDENT_AMBULATORY_CARE_PROVIDER_SITE_OTHER): Payer: 59 | Admitting: Physical Therapy

## 2022-03-29 DIAGNOSIS — M25551 Pain in right hip: Secondary | ICD-10-CM | POA: Diagnosis not present

## 2022-03-29 NOTE — Therapy (Signed)
OUTPATIENT PHYSICAL THERAPY TREATMENT   Patient Name: Samantha Becker MRN: [id number] DOB:[birth year], 66 y.o., female Today's Date: 03/29/2022   PT End of Session - 03/29/22 2119     Visit Number 8    Number of Visits 20    Date for PT Re-Evaluation 05/04/22    Authorization Type UHC,  Re-eval  done at visit 6    PT Start Time 0850    PT Stop Time 0928    PT Time Calculation (min) 38 min    Activity Tolerance Patient tolerated treatment well    Behavior During Therapy WFL for tasks assessed/performed                   Past Medical History:  Diagnosis Date   Arthritis    Chicken pox    GERD (gastroesophageal reflux disease)    OTC   Leaking of urine    Low iron    Mumps    Past Surgical History:  Procedure Laterality Date   CESAREAN SECTION     4 cs   LAPAROSCOPY  03/04/2012   Procedure: LAPAROSCOPY OPERATIVE;  Surgeon: Eldred Manges, MD;  Location: [facility];  Service: Gynecology;  Laterality: N/A;  Peritoneum Biopsy   SHOULDER ARTHROSCOPY Right 03/20/2022   Procedure: RIGHT SHOULDER ARTHROSCOPY WITH DISTAL CLAVICLE RESECTION;  Surgeon: Vanetta Mulders, MD;  Location: [place];  Service: Orthopedics;  Laterality: Right;   TUBAL LIGATION Bilateral    Patient Active Problem List   Diagnosis Date Noted   Osteoarthritis of AC (acromioclavicular) joint    Endolymphatic hydrops of right ear 08/18/2020   Right-sided sensorineural hearing loss 08/18/2020   Atrophic vaginitis 03/20/2017   Endometriosis 03/18/2012   Dyspareunia 02/01/2012    PCP: Inda Coke  REFERRING PROVIDER: Ellard Artis corey/ Vanetta Mulders  REFERRING DIAG: R hip pain/ R shoulder pain/ S/P DCE  THERAPY DIAG:  Pain in right hip  Rationale for Evaluation and Treatment Rehabilitation  ONSET DATE:   SUBJECTIVE:   SUBJECTIVE STATEMENT: 03/29/2022 Pt with improved pain from earlier in week.    PERTINENT HISTORY:   PAIN:  Are you having pain? Yes: NPRS scale:  4/10 Pain location: R hip  Pain description: Sore Aggravating factors: late at night, and early AM.  Relieving factors: movement, walking.   Are you having pain? Yes: NPRS scale: 3/10 Pain location: R shoulder  Pain description: Sore Aggravating factors:   movement  Relieving factors: ice  PRECAUTIONS: None  WEIGHT BEARING RESTRICTIONS No  FALLS:  Has patient fallen in last 6 months? No  PLOF: Independent  PATIENT GOALS   decreased pain in R hip. And R shoulder, return to regular use of R arm.     OBJECTIVE: updated 7/28  DIAGNOSTIC FINDINGS: no imaging.    COGNITION:  Overall cognitive status: Within functional limits for tasks assessed      POSTURE: No Significant postural limitations  PALPATION: Tenderness in Gr troch, into R glute med, min, piriformis.    LOWER EXTREMITY ROM  Lumbar: WFL,   Hips: WFL   Shoulder PROM: flex: 90 deg, abd: 90 deg,  IR: 40 , ER: 35 deg   Shoulder AROM: not tested.   LOWER EXTREMITY MMT:  Hips: flex: 4+/5, abd: 4+/5  Shoulder: not tested.   LOWER EXTREMITY SPECIAL TESTS:  Nadara Mustard,    TODAY'S TREATMENT:  03/29/2022 Ther ex:  supine flexion AAROM/ 2 hand hold 2 x 10; AROM ER/IR 2x10;   IR/ER manual isometrics  at neutral x 10 ea;  Standing: elbow flex/ext  1lb 2x 10,  pendulum  fwd/bwd x 10 ea; Flexion stretch at railing 20 sec x 3;  Seated: hand grip /green ring 2 x 10; Pulley x 3 min for flexion;  Manual: PROM for R shoulder, all motions to tolerance.    Marland Kitchen PATIENT EDUCATION:  Education details: reviewed HEP,  Person educated: Patient Education method: Explanation, Demonstration, Tactile cues, Verbal cues, and Handouts Education comprehension: verbalized understanding, returned demonstration, verbal cues required, tactile cues required, and needs further education   HOME EXERCISE PROGRAM: Access Code: [email or web address]   - hip Access Code: [email or web address] - shoulder      ASSESSMENT:  CLINICAL  IMPRESSION: 03/29/2022 Pt with improved ability for AAROM with less pain today. Improved ROM for elevation. Will progress as tolerated.    OBJECTIVE IMPAIRMENTS decreased activity tolerance, decreased knowledge of use of DME, decreased mobility, decreased ROM, decreased strength, increased muscle spasms, improper body mechanics, and pain.   ACTIVITY LIMITATIONS carrying, lifting, bending, standing, squatting, stairs, dressing, reach over head, and locomotion level  PARTICIPATION LIMITATIONS: meal prep, cleaning, laundry, driving, shopping, community activity, and yard work  PERSONAL FACTORS Time since onset of injury/illness/exacerbation are also affecting patient's functional outcome.   REHAB POTENTIAL: Good  CLINICAL DECISION MAKING: Stable/uncomplicated  EVALUATION COMPLEXITY: Low   GOALS: Goals reviewed with patient? Yes  SHORT TERM GOALS: Target date:  03/01/22  Pt to be independent with initial HEP  Goal status: MET   2.  Pt to report decrease in pain in glute muscles and soft tissue by at least 50%, for decreased pain.   Goal status: MET    LONG TERM GOALS: Target date: 05/04/22  Pt to be independent with final HEP  Goal status: IN PROGRESS  2.  Pt to report decreased pain in R hip and glute to 0-2/10 with activity.  Goal status: IN PROGRESS  3.  Pt to demo improved strength of bil hips to at least 4+/5 to improve stability and pain.   Goal status: IN PROGRESS  4.  Pt to demo ability for standing/walking activity for at least 30 min without increased pain, to improve ability for community activity.    Goal status: IN PROGRESS  5. PT to demo ability for full shoulder AROM without pain, to improve ability for ADLS.   Goal status: Initial   6. Pt to demo improved strength of R shoulder to at least 4+/5 to improve ability for IADLS.   Goal status: initial    PLAN: PT FREQUENCY: 1-2x/week  PT DURATION: 6 weeks  PLANNED INTERVENTIONS: Therapeutic exercises,  Therapeutic activity, Neuromuscular re-education, Gait training, Patient/Family education, Joint manipulation, Joint mobilization, Stair training, DME instructions, Dry Needling, Electrical stimulation, Spinal manipulation, Spinal mobilization, Cryotherapy, Moist heat, Taping, Traction, Ultrasound, Ionotophoresis 7m/ml Dexamethasone, and Manual therapy  PLAN FOR NEXT SESSION:   LLyndee Hensen PT, DPT 9:20 PM  03/29/22

## 2022-04-02 ENCOUNTER — Ambulatory Visit (INDEPENDENT_AMBULATORY_CARE_PROVIDER_SITE_OTHER): Payer: 59 | Admitting: Orthopaedic Surgery

## 2022-04-02 DIAGNOSIS — M19019 Primary osteoarthritis, unspecified shoulder: Secondary | ICD-10-CM

## 2022-04-02 NOTE — Progress Notes (Signed)
Post Operative Evaluation    Procedure/Date of Surgery: Right shoulder acromioplasty with distal clavicle resection 03/20/22  Interval History:  Presents today 2 weeks status post the above procedure.  Overall she is doing very well.  She has not required any narcotic pain medication.  She has been compliant with aspirin.  She has begun working with physical therapy.   PMH/PSH/Family History/Social History/Meds/Allergies:    Past Medical History:  Diagnosis Date   Arthritis    Chicken pox    GERD (gastroesophageal reflux disease)    OTC   Leaking of urine    Low iron    Mumps    Past Surgical History:  Procedure Laterality Date   CESAREAN SECTION     4 cs   LAPAROSCOPY  03/04/2012   Procedure: LAPAROSCOPY OPERATIVE;  Surgeon: Eldred Manges, MD;  Location: [facility];  Service: Gynecology;  Laterality: N/A;  Peritoneum Biopsy   SHOULDER ARTHROSCOPY Right 03/20/2022   Procedure: RIGHT SHOULDER ARTHROSCOPY WITH DISTAL CLAVICLE RESECTION;  Surgeon: Vanetta Mulders, MD;  Location: [place];  Service: Orthopedics;  Laterality: Right;   TUBAL LIGATION Bilateral    Social History   Socioeconomic History   Marital status: Married    Spouse name: Not on file   Number of children: Not on file   Years of education: Not on file   Highest education level: Not on file  Occupational History   Occupation: TEACHER     Employer: [employer]  Tobacco Use   Smoking status: Never   Smokeless tobacco: Never  Substance and Sexual Activity   Alcohol use: Yes    Alcohol/week: 4.0 standard drinks of alcohol    Types: 2 Glasses of wine, 2 Cans of beer per week    Comment: wine nightly   Drug use: No   Sexual activity: Yes    Birth control/protection: Surgical, Post-menopausal    Comment: BTL  Other Topics Concern   Not on file  Social History Narrative   Prior Pharmacist, hospital at [employer], 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   Social Determinants of Health    Financial Resource Strain: Not on file  Food Insecurity: Not on file  Transportation Needs: Not on file  Physical Activity: Not on file  Stress: Not on file  Social Connections: Not on file   Family History  Problem Relation Age of Onset   Heart disease Father    Hypertension Father    Cancer Father    Stroke Father    Heart disease Mother    Hypertension Mother    Arthritis Mother    Breast cancer Mother    Osteoporosis Mother    Arthritis Sister    Asthma Sister    Allergies  Allergen Reactions   Morphine Nausea Only   Current Outpatient Medications  Medication Sig Dispense Refill   aspirin EC 325 MG tablet TAKE 1 TABLET BY MOUTH EVERY DAY 30 tablet 0   conjugated estrogens (PREMARIN) vaginal cream Premarin 0.625 mg/gram vaginal cream  Insert 0.5 applicatorsful twice a week by vaginal route. 42.5 g 2   diclofenac Sodium (VOLTAREN) 1 % GEL Apply 2 g topically as needed.     fluticasone (FLONASE) 50 MCG/ACT nasal spray Place 1 spray into both nostrils as needed for allergies or rhinitis.     ibuprofen (ADVIL)  200 MG tablet Take 800 mg by mouth every 6 (six) hours as needed.     triamcinolone cream (KENALOG) 0.1 % Apply to affected area 1-2 times daily 30 g 0   No current facility-administered medications for this visit.   No results found.  Review of Systems:   A ROS was performed including pertinent positives and negatives as documented in the HPI.   Musculoskeletal Exam:    There were no vitals taken for this visit.  Right portals are well-appearing without erythema or drainage.  Active forward elevation is to approximately 90.  External rotation at the side is to 30 degrees.  Internal rotation is to side pocket.  Distal neurosensory exam is intact with 2+ radial pulse.  Imaging:    None  I personally reviewed and interpreted the radiographs.   Assessment:   2 weeks status post right shoulder acromioplasty and distal clavicle resection overall doing  extremely well.  At this time I would like her to begin active range of motion as tolerated and she will discontinue the sling.  She will continue to progress according to my distal clavicle resection/debridement protocol.  I have also advised her on a pulley as she has been using this at rehab and I do believe that this could additionally help her at home as well.  I will plan to see her back in 4 weeks  Plan :    -Return to clinic in 4 weeks      I personally saw and evaluated the patient, and participated in the management and treatment plan.  Vanetta Mulders, MD Attending Physician, Orthopedic Surgery  This document was dictated using Dragon voice recognition software. A reasonable attempt at proof reading has been made to minimize errors.

## 2022-04-03 ENCOUNTER — Encounter: Payer: Self-pay | Admitting: Physical Therapy

## 2022-04-03 ENCOUNTER — Encounter: Payer: 59 | Admitting: Physical Therapy

## 2022-04-03 ENCOUNTER — Ambulatory Visit (INDEPENDENT_AMBULATORY_CARE_PROVIDER_SITE_OTHER): Payer: 59 | Admitting: Physical Therapy

## 2022-04-03 DIAGNOSIS — M25551 Pain in right hip: Secondary | ICD-10-CM

## 2022-04-03 NOTE — Therapy (Signed)
OUTPATIENT PHYSICAL THERAPY TREATMENT   Patient Name: Samantha Becker MRN: [id number] DOB:09-Aug-1956, 66 y.o., female Today's Date: 04/03/2022   PT End of Session - 04/03/22 1348     Visit Number 9    Number of Visits 20    Date for PT Re-Evaluation 05/04/22    Authorization Type UHC,  Re-eval  done at visit 6    PT Start Time 1348    PT Stop Time 1427    PT Time Calculation (min) 39 min    Activity Tolerance Patient tolerated treatment well    Behavior During Therapy WFL for tasks assessed/performed                   Past Medical History:  Diagnosis Date   Arthritis    Chicken pox    GERD (gastroesophageal reflux disease)    OTC   Leaking of urine    Low iron    Mumps    Past Surgical History:  Procedure Laterality Date   CESAREAN SECTION     4 cs   LAPAROSCOPY  03/04/2012   Procedure: LAPAROSCOPY OPERATIVE;  Surgeon: Eldred Manges, MD;  Location: [facility];  Service: Gynecology;  Laterality: N/A;  Peritoneum Biopsy   SHOULDER ARTHROSCOPY Right 03/20/2022   Procedure: RIGHT SHOULDER ARTHROSCOPY WITH DISTAL CLAVICLE RESECTION;  Surgeon: Vanetta Mulders, MD;  Location: [place];  Service: Orthopedics;  Laterality: Right;   TUBAL LIGATION Bilateral    Patient Active Problem List   Diagnosis Date Noted   Osteoarthritis of AC (acromioclavicular) joint    Endolymphatic hydrops of right ear 08/18/2020   Right-sided sensorineural hearing loss 08/18/2020   Atrophic vaginitis 03/20/2017   Endometriosis 03/18/2012   Dyspareunia 02/01/2012    PCP: Inda Coke  REFERRING PROVIDER: Ellard Artis corey/ Vanetta Mulders  REFERRING DIAG: R hip pain/ R shoulder pain/ S/P DCE  THERAPY DIAG:  Pain in right hip  Rationale for Evaluation and Treatment Rehabilitation  ONSET DATE:   SUBJECTIVE:   SUBJECTIVE STATEMENT: 04/03/2022 Pt states she saw the MD and he said to discontinue the sling and she ordered the pulleys. States her right side is just  sore.    PERTINENT HISTORY:   PAIN:  Are you having pain? Yes: NPRS scale: 4/10 Pain location: R hip  Pain description: Sore Aggravating factors: late at night, and early AM.  Relieving factors: movement, walking.   Are you having pain? Yes: NPRS scale: 6/10 Pain location: R shoulder  Pain description: Sore Aggravating factors:   movement  Relieving factors: ice  PRECAUTIONS: None  WEIGHT BEARING RESTRICTIONS No  FALLS:  Has patient fallen in last 6 months? No  PLOF: Independent  PATIENT GOALS   decreased pain in R hip. And R shoulder, return to regular use of R arm.     OBJECTIVE: updated 7/28  DIAGNOSTIC FINDINGS: no imaging.    COGNITION:  Overall cognitive status: Within functional limits for tasks assessed      POSTURE: No Significant postural limitations  PALPATION: Tenderness in Gr troch, into R glute med, min, piriformis.    LOWER EXTREMITY ROM  Lumbar: WFL,   Hips: WFL   Shoulder PROM: flex: 90 deg, abd: 90 deg,  IR: 40 , ER: 35 deg   Shoulder AROM: not tested.   LOWER EXTREMITY MMT:  Hips: flex: 4+/5, abd: 4+/5  Shoulder: not tested.   LOWER EXTREMITY SPECIAL TESTS:  Nadara Mustard,    TODAY'S TREATMENT: 04/03/2022 Ther ex:  supine  scap retraction 2 minutes Bs, flexion AAROM/ dowel 3 minutes; AROM ER/IR 2x10;  bench press with + and dowel 3 minutes Standing: wall flexion slides 2 minutes  Seated: Pulley x 3 min for flexion; shoulder add iso 2 minutes 5" holds R  Manual: PROM for R shoulder with gentle AP and traction, all motions to tolerance.    Marland Kitchen PATIENT EDUCATION:  Education details: reviewed HEP, on progression of exercises.  Person educated: Patient Education method: Explanation, Demonstration, Tactile cues, Verbal cues, and Handouts Education comprehension: verbalized understanding, returned demonstration, verbal cues required, tactile cues required, and needs further education   HOME EXERCISE PROGRAM: Access Code:  [email or web address]   - hip Access Code: [email or web address] - shoulder      ASSESSMENT:  CLINICAL IMPRESSION: 04/03/2022 Continued to progress exercises as tolerated. No pain just soreness noted during session. Added new exercises to HEP. Improved motion with exercises, fatigue in arm noted end of sessio. Will continue with current POC as tolerated.    OBJECTIVE IMPAIRMENTS decreased activity tolerance, decreased knowledge of use of DME, decreased mobility, decreased ROM, decreased strength, increased muscle spasms, improper body mechanics, and pain.   ACTIVITY LIMITATIONS carrying, lifting, bending, standing, squatting, stairs, dressing, reach over head, and locomotion level  PARTICIPATION LIMITATIONS: meal prep, cleaning, laundry, driving, shopping, community activity, and yard work  PERSONAL FACTORS Time since onset of injury/illness/exacerbation are also affecting patient's functional outcome.   REHAB POTENTIAL: Good  CLINICAL DECISION MAKING: Stable/uncomplicated  EVALUATION COMPLEXITY: Low   GOALS: Goals reviewed with patient? Yes  SHORT TERM GOALS: Target date:  03/01/22  Pt to be independent with initial HEP  Goal status: MET   2.  Pt to report decrease in pain in glute muscles and soft tissue by at least 50%, for decreased pain.   Goal status: MET    LONG TERM GOALS: Target date: 05/04/22  Pt to be independent with final HEP  Goal status: IN PROGRESS  2.  Pt to report decreased pain in R hip and glute to 0-2/10 with activity.  Goal status: IN PROGRESS  3.  Pt to demo improved strength of bil hips to at least 4+/5 to improve stability and pain.   Goal status: IN PROGRESS  4.  Pt to demo ability for standing/walking activity for at least 30 min without increased pain, to improve ability for community activity.    Goal status: IN PROGRESS  5. PT to demo ability for full shoulder AROM without pain, to improve ability for ADLS.   Goal status: Initial   6. Pt to demo improved  strength of R shoulder to at least 4+/5 to improve ability for IADLS.   Goal status: initial    PLAN: PT FREQUENCY: 1-2x/week  PT DURATION: 6 weeks  PLANNED INTERVENTIONS: Therapeutic exercises, Therapeutic activity, Neuromuscular re-education, Gait training, Patient/Family education, Joint manipulation, Joint mobilization, Stair training, DME instructions, Dry Needling, Electrical stimulation, Spinal manipulation, Spinal mobilization, Cryotherapy, Moist heat, Taping, Traction, Ultrasound, Ionotophoresis 56m/ml Dexamethasone, and Manual therapy  PLAN FOR NEXT SESSION:   2:27 PM, 04/03/22 MJerene Pitch DPT Physical Therapy with [facility]

## 2022-04-05 ENCOUNTER — Encounter: Payer: Self-pay | Admitting: Physical Therapy

## 2022-04-05 ENCOUNTER — Ambulatory Visit (INDEPENDENT_AMBULATORY_CARE_PROVIDER_SITE_OTHER): Payer: 59 | Admitting: Physical Therapy

## 2022-04-05 DIAGNOSIS — M25551 Pain in right hip: Secondary | ICD-10-CM | POA: Diagnosis not present

## 2022-04-05 NOTE — Therapy (Signed)
OUTPATIENT PHYSICAL THERAPY TREATMENT   Patient Name: Samantha Becker MRN: [id number] DOB:29-Oct-1955, 66 y.o., female Today's Date: 04/05/2022   PT End of Session - 04/05/22 0954     Visit Number 10    Number of Visits 20    Date for PT Re-Evaluation 05/04/22    Authorization Type [employer],  Re-eval  done at visit 6    PT Start Time 0938    PT Stop Time 1016    PT Time Calculation (min) 38 min    Activity Tolerance Patient tolerated treatment well    Behavior During Therapy WFL for tasks assessed/performed                   Past Medical History:  Diagnosis Date   Arthritis    Chicken pox    GERD (gastroesophageal reflux disease)    OTC   Leaking of urine    Low iron    Mumps    Past Surgical History:  Procedure Laterality Date   CESAREAN SECTION     4 cs   LAPAROSCOPY  03/04/2012   Procedure: LAPAROSCOPY OPERATIVE;  Surgeon: Eldred Manges, MD;  Location: [facility];  Service: Gynecology;  Laterality: N/A;  Peritoneum Biopsy   SHOULDER ARTHROSCOPY Right 03/20/2022   Procedure: RIGHT SHOULDER ARTHROSCOPY WITH DISTAL CLAVICLE RESECTION;  Surgeon: Vanetta Mulders, MD;  Location: [facility];  Service: Orthopedics;  Laterality: Right;   TUBAL LIGATION Bilateral    Patient Active Problem List   Diagnosis Date Noted   Osteoarthritis of AC (acromioclavicular) joint    Endolymphatic hydrops of right ear 08/18/2020   Right-sided sensorineural hearing loss 08/18/2020   Atrophic vaginitis 03/20/2017   Endometriosis 03/18/2012   Dyspareunia 02/01/2012    PCP: Inda Coke  REFERRING PROVIDER: Ellard Artis corey/ Vanetta Mulders  REFERRING DIAG: R hip pain/ R shoulder pain/ S/P DCE  THERAPY DIAG:  Pain in right hip  Rationale for Evaluation and Treatment Rehabilitation  ONSET DATE:   SUBJECTIVE:   SUBJECTIVE STATEMENT: 04/05/2022 Pt reports doing well. Thinks arm is moving a bit better.    PERTINENT HISTORY:   PAIN:  Are you having pain?  Yes: NPRS scale: 4/10 Pain location: R hip  Pain description: Sore Aggravating factors: late at night, and early AM.  Relieving factors: movement, walking.   Are you having pain? Yes: NPRS scale: 6/10 Pain location: R shoulder  Pain description: Sore Aggravating factors:   movement  Relieving factors: ice  PRECAUTIONS: None  WEIGHT BEARING RESTRICTIONS No  FALLS:  Has patient fallen in last 6 months? No  PLOF: Independent  PATIENT GOALS   decreased pain in R hip. And R shoulder, return to regular use of R arm.     OBJECTIVE: updated 7/28  DIAGNOSTIC FINDINGS: no imaging.    COGNITION:  Overall cognitive status: Within functional limits for tasks assessed      POSTURE: No Significant postural limitations  PALPATION: Tenderness in Gr troch, into R glute med, min, piriformis.    LOWER EXTREMITY ROM  Lumbar: WFL,   Hips: WFL   Shoulder PROM: flex: 90 deg, abd: 90 deg,  IR: 40 , ER: 35 deg   Shoulder AROM: not tested.  8/10  PROM Flex:  150 Abd:  155 ER;    65   LOWER EXTREMITY MMT:  Hips: flex: 4+/5, abd: 4+/5  Shoulder: not tested.   LOWER EXTREMITY SPECIAL TESTS:  Nadara Mustard,    TODAY'S TREATMENT: 04/05/2022 Ther ex:  supine  flexion AAROM/ dowel x10 ; AROM chest press motion x 10;  AROM at 90/90 flex/ext 2x10;  S/L: ER 2x 10 Standing: wall flexion bil with towel, slides 2 minutes; AROM reach to counter height x 3 for education on IADLS.  AROM reach with bent elbow to ear x 5;  Scap squeeze x 15;  Seated: Pulley x 3 min for flexion and abd;  Manual: PROM for R shoulder  all motions to tolerance.    Marland Kitchen PATIENT EDUCATION:  Education details: reviewed HEP, on progression of exercises.  Person educated: Patient Education method: Explanation, Demonstration, Tactile cues, Verbal cues, and Handouts Education comprehension: verbalized understanding, returned demonstration, verbal cues required, tactile cues required, and needs further  education   HOME EXERCISE PROGRAM: Access Code: [email or web address]   - hip Access Code: [email or web address] - shoulder      ASSESSMENT:  CLINICAL IMPRESSION: 04/05/2022 Pt progressing well. Improving ability and ROM for PROM and AAROM today, with less pain. She will continue pulleys at home as well. Will progress as tolerated.    OBJECTIVE IMPAIRMENTS decreased activity tolerance, decreased knowledge of use of DME, decreased mobility, decreased ROM, decreased strength, increased muscle spasms, improper body mechanics, and pain.   ACTIVITY LIMITATIONS carrying, lifting, bending, standing, squatting, stairs, dressing, reach over head, and locomotion level  PARTICIPATION LIMITATIONS: meal prep, cleaning, laundry, driving, shopping, community activity, and yard work  PERSONAL FACTORS Time since onset of injury/illness/exacerbation are also affecting patient's functional outcome.   REHAB POTENTIAL: Good  CLINICAL DECISION MAKING: Stable/uncomplicated  EVALUATION COMPLEXITY: Low   GOALS: Goals reviewed with patient? Yes  SHORT TERM GOALS: Target date:  03/01/22  Pt to be independent with initial HEP  Goal status: MET   2.  Pt to report decrease in pain in glute muscles and soft tissue by at least 50%, for decreased pain.   Goal status: MET    LONG TERM GOALS: Target date: 05/04/22  Pt to be independent with final HEP  Goal status: IN PROGRESS  2.  Pt to report decreased pain in R hip and glute to 0-2/10 with activity.  Goal status: IN PROGRESS  3.  Pt to demo improved strength of bil hips to at least 4+/5 to improve stability and pain.   Goal status: IN PROGRESS  4.  Pt to demo ability for standing/walking activity for at least 30 min without increased pain, to improve ability for community activity.    Goal status: IN PROGRESS  5. PT to demo ability for full shoulder AROM without pain, to improve ability for ADLS.   Goal status: Initial   6. Pt to demo improved strength of R  shoulder to at least 4+/5 to improve ability for IADLS.   Goal status: initial    PLAN: PT FREQUENCY: 1-2x/week  PT DURATION: 6 weeks  PLANNED INTERVENTIONS: Therapeutic exercises, Therapeutic activity, Neuromuscular re-education, Gait training, Patient/Family education, Joint manipulation, Joint mobilization, Stair training, DME instructions, Dry Needling, Electrical stimulation, Spinal manipulation, Spinal mobilization, Cryotherapy, Moist heat, Taping, Traction, Ultrasound, Ionotophoresis 65m/ml Dexamethasone, and Manual therapy  PLAN FOR NEXT SESSION:   LLyndee Hensen PT, DPT 9:55 AM  04/05/22

## 2022-04-10 ENCOUNTER — Encounter: Payer: Self-pay | Admitting: Physical Therapy

## 2022-04-10 ENCOUNTER — Ambulatory Visit (INDEPENDENT_AMBULATORY_CARE_PROVIDER_SITE_OTHER): Payer: 59 | Admitting: Physical Therapy

## 2022-04-10 DIAGNOSIS — M25551 Pain in right hip: Secondary | ICD-10-CM | POA: Diagnosis not present

## 2022-04-10 NOTE — Therapy (Signed)
OUTPATIENT PHYSICAL THERAPY TREATMENT   Patient Name: Samantha Becker MRN: [id number] DOB:22-Jan-1956, 66 y.o., female Today's Date: 04/10/2022   PT End of Session - 04/10/22 1202     Visit Number 11    Number of Visits 20    Date for PT Re-Evaluation 05/04/22    Authorization Type [employer],  Re-eval  done at visit 6    PT Start Time 0850    PT Stop Time 0930    PT Time Calculation (min) 40 min    Activity Tolerance Patient tolerated treatment well    Behavior During Therapy WFL for tasks assessed/performed                    Past Medical History:  Diagnosis Date   Arthritis    Chicken pox    GERD (gastroesophageal reflux disease)    OTC   Leaking of urine    Low iron    Mumps    Past Surgical History:  Procedure Laterality Date   CESAREAN SECTION     4 cs   LAPAROSCOPY  03/04/2012   Procedure: LAPAROSCOPY OPERATIVE;  Surgeon: Eldred Manges, MD;  Location: [facility];  Service: Gynecology;  Laterality: N/A;  Peritoneum Biopsy   SHOULDER ARTHROSCOPY Right 03/20/2022   Procedure: RIGHT SHOULDER ARTHROSCOPY WITH DISTAL CLAVICLE RESECTION;  Surgeon: Vanetta Mulders, MD;  Location: [facility];  Service: Orthopedics;  Laterality: Right;   TUBAL LIGATION Bilateral    Patient Active Problem List   Diagnosis Date Noted   Osteoarthritis of AC (acromioclavicular) joint    Endolymphatic hydrops of right ear 08/18/2020   Right-sided sensorineural hearing loss 08/18/2020   Atrophic vaginitis 03/20/2017   Endometriosis 03/18/2012   Dyspareunia 02/01/2012    PCP: Inda Coke  REFERRING PROVIDER: Ellard Artis corey/ Vanetta Mulders  REFERRING DIAG: R hip pain/ R shoulder pain/ S/P DCE  THERAPY DIAG:  Pain in right hip  Rationale for Evaluation and Treatment Rehabilitation  ONSET DATE:   SUBJECTIVE:   SUBJECTIVE STATEMENT: 04/10/2022 Pt reports doing well. Thinks arm is moving better.    PERTINENT HISTORY:   PAIN:  Are you having pain? Yes:  NPRS scale: 4/10 Pain location: R hip  Pain description: Sore Aggravating factors: late at night, and early AM.  Relieving factors: movement, walking.   Are you having pain? Yes: NPRS scale: 6/10 Pain location: R shoulder  Pain description: Sore Aggravating factors:   movement  Relieving factors: ice  PRECAUTIONS: None  WEIGHT BEARING RESTRICTIONS No  FALLS:  Has patient fallen in last 6 months? No  PLOF: Independent  PATIENT GOALS   decreased pain in R hip. And R shoulder, return to regular use of R arm.     OBJECTIVE: updated 7/28  DIAGNOSTIC FINDINGS: no imaging.    COGNITION:  Overall cognitive status: Within functional limits for tasks assessed      POSTURE: No Significant postural limitations  PALPATION: Tenderness in Gr troch, into R glute med, min, piriformis.    LOWER EXTREMITY ROM  Lumbar: WFL,   Hips: WFL   Shoulder PROM: flex: 90 deg, abd: 90 deg,  IR: 40 , ER: 35 deg   Shoulder AROM: not tested.  8/10  PROM Flex:  150 Abd:  155 ER;    65   LOWER EXTREMITY MMT:  Hips: flex: 4+/5, abd: 4+/5  Shoulder: not tested.   LOWER EXTREMITY SPECIAL TESTS:  Nadara Mustard,    TODAY'S TREATMENT: 04/10/2022 Ther ex:  supine  flexion AROM full motion 2x10; AROM horiz abd x 10; Pec stretch off side of table x 2 min;  S/L: ER 2x 10 Standing: AROM /scaption x10;  AROM abd to 90 deg x 10;  Scap squeeze x 15;  IR behind back ROM x 10;  Seated: Pulley x 3 min for flexion and abd;  Manual: PROM for R shoulder  all motions to tolerance. Post and inf GHJ mobs, Manual pec stretch (light)    . PATIENT EDUCATION:  Education details: reviewed HEP, Person educated: Patient Education method: Explanation, Demonstration, Tactile cues, Verbal cues, and Handouts Education comprehension: verbalized understanding, returned demonstration, verbal cues required, tactile cues required, and needs further education   HOME EXERCISE PROGRAM: Access Code: [email or web address]   -  hip Access Code: [email or web address] - shoulder      ASSESSMENT:  CLINICAL IMPRESSION: 04/10/2022 Pt progressing well post op. She has improving ability for AROM/near full motion today in standing, with little pain. She is still taking tylenol multiple times/day due to soreness. But doing well with activity, no increased pain with exercises today. She has strength limitations as well as limited ROM wiht IR behind the back that we will continue to work on. Plan to progress as tolerated.   OBJECTIVE IMPAIRMENTS decreased activity tolerance, decreased knowledge of use of DME, decreased mobility, decreased ROM, decreased strength, increased muscle spasms, improper body mechanics, and pain.   ACTIVITY LIMITATIONS carrying, lifting, bending, standing, squatting, stairs, dressing, reach over head, and locomotion level  PARTICIPATION LIMITATIONS: meal prep, cleaning, laundry, driving, shopping, community activity, and yard work  PERSONAL FACTORS Time since onset of injury/illness/exacerbation are also affecting patient's functional outcome.   REHAB POTENTIAL: Good  CLINICAL DECISION MAKING: Stable/uncomplicated  EVALUATION COMPLEXITY: Low   GOALS: Goals reviewed with patient? Yes  SHORT TERM GOALS: Target date:  03/01/22  Pt to be independent with initial HEP  Goal status: MET   2.  Pt to report decrease in pain in glute muscles and soft tissue by at least 50%, for decreased pain.   Goal status: MET    LONG TERM GOALS: Target date: 05/04/22  Pt to be independent with final HEP  Goal status: IN PROGRESS  2.  Pt to report decreased pain in R hip and glute to 0-2/10 with activity.  Goal status: IN PROGRESS  3.  Pt to demo improved strength of bil hips to at least 4+/5 to improve stability and pain.   Goal status: IN PROGRESS  4.  Pt to demo ability for standing/walking activity for at least 30 min without increased pain, to improve ability for community activity.    Goal status: IN  PROGRESS  5. PT to demo ability for full shoulder AROM without pain, to improve ability for ADLS.   Goal status: Initial   6. Pt to demo improved strength of R shoulder to at least 4+/5 to improve ability for IADLS.   Goal status: initial    PLAN: PT FREQUENCY: 1-2x/week  PT DURATION: 6 weeks  PLANNED INTERVENTIONS: Therapeutic exercises, Therapeutic activity, Neuromuscular re-education, Gait training, Patient/Family education, Joint manipulation, Joint mobilization, Stair training, DME instructions, Dry Needling, Electrical stimulation, Spinal manipulation, Spinal mobilization, Cryotherapy, Moist heat, Taping, Traction, Ultrasound, Ionotophoresis 42m/ml Dexamethasone, and Manual therapy  PLAN FOR NEXT SESSION:   LLyndee Hensen PT, DPT 12:02 PM  04/10/22

## 2022-04-12 ENCOUNTER — Ambulatory Visit (INDEPENDENT_AMBULATORY_CARE_PROVIDER_SITE_OTHER): Payer: Self-pay | Admitting: Physical Therapy

## 2022-04-12 ENCOUNTER — Encounter: Payer: Self-pay | Admitting: Physical Therapy

## 2022-04-12 DIAGNOSIS — M25551 Pain in right hip: Secondary | ICD-10-CM

## 2022-04-12 DIAGNOSIS — M25511 Pain in right shoulder: Secondary | ICD-10-CM

## 2022-04-12 NOTE — Therapy (Signed)
OUTPATIENT PHYSICAL THERAPY TREATMENT   Patient Name: Samantha Becker MRN: [id number] DOB:04/23/1956, 66 y.o., female Today's Date: 04/12/2022   PT End of Session - 04/12/22 0851     Visit Number 12    Number of Visits 20    Date for PT Re-Evaluation 05/04/22    Authorization Type [employer],  Re-eval  done at visit 6    PT Start Time 0851    PT Stop Time 0930    PT Time Calculation (min) 39 min    Activity Tolerance Patient tolerated treatment well    Behavior During Therapy WFL for tasks assessed/performed                    Past Medical History:  Diagnosis Date   Arthritis    Chicken pox    GERD (gastroesophageal reflux disease)    OTC   Leaking of urine    Low iron    Mumps    Past Surgical History:  Procedure Laterality Date   CESAREAN SECTION     4 cs   LAPAROSCOPY  03/04/2012   Procedure: LAPAROSCOPY OPERATIVE;  Surgeon: Eldred Manges, MD;  Location: [facility];  Service: Gynecology;  Laterality: N/A;  Peritoneum Biopsy   SHOULDER ARTHROSCOPY Right 03/20/2022   Procedure: RIGHT SHOULDER ARTHROSCOPY WITH DISTAL CLAVICLE RESECTION;  Surgeon: Vanetta Mulders, MD;  Location: [facility];  Service: Orthopedics;  Laterality: Right;   TUBAL LIGATION Bilateral    Patient Active Problem List   Diagnosis Date Noted   Osteoarthritis of AC (acromioclavicular) joint    Endolymphatic hydrops of right ear 08/18/2020   Right-sided sensorineural hearing loss 08/18/2020   Atrophic vaginitis 03/20/2017   Endometriosis 03/18/2012   Dyspareunia 02/01/2012    PCP: Inda Coke  REFERRING PROVIDER: Ellard Artis corey/ Vanetta Mulders  REFERRING DIAG: R hip pain/ R shoulder pain/ S/P DCE  THERAPY DIAG:  Pain in right hip  Acute pain of right shoulder  Rationale for Evaluation and Treatment Rehabilitation  ONSET DATE:   SUBJECTIVE:   SUBJECTIVE STATEMENT: 04/12/2022 Shoulder doing well, moving well. Continues to have soreness at rest, under  incisions. Up to 6/10 today. R glute sore in the last few days. Mostly bothersome at night.    PERTINENT HISTORY:   PAIN:  Are you having pain? Yes: NPRS scale: 4/10 Pain location: R hip  Pain description: Sore Aggravating factors: late at night, and early AM.  Relieving factors: movement, walking.   Are you having pain? Yes: NPRS scale: 6/10 Pain location: R shoulder  Pain description: Sore Aggravating factors:   movement  Relieving factors: ice  PRECAUTIONS: None  WEIGHT BEARING RESTRICTIONS No  FALLS:  Has patient fallen in last 6 months? No  PLOF: Independent  PATIENT GOALS   decreased pain in R hip. And R shoulder, return to regular use of R arm.     OBJECTIVE: updated 7/28  DIAGNOSTIC FINDINGS: no imaging.    COGNITION:  Overall cognitive status: Within functional limits for tasks assessed      POSTURE: No Significant postural limitations  PALPATION: Tenderness in Gr troch, into R glute med, min, piriformis.    LOWER EXTREMITY ROM  Lumbar: WFL,   Hips: WFL   Shoulder PROM: flex: 90 deg, abd: 90 deg,  IR: 40 , ER: 35 deg   Shoulder AROM: not tested.  8/10  PROM Flex:  150 Abd:  155 ER;    65   LOWER EXTREMITY MMT:  Hips: flex: 4+/5,  abd: 4+/5  Shoulder: not tested.   LOWER EXTREMITY SPECIAL TESTS:  Neg Benjamine Mola,    TODAY'S TREATMENT: 04/12/2022 Ther ex:  supine  flexion AROM full motion x10;  Pec stretch off side of table  2 x 1 min;  S/L:  Standing: AROM /scaption x10;   Scap squeeze x 10; Rows RTB x 15;  IR behind back ROM x 10; Wall slides 1 UE x 15;  Seated: Pulley x 3 min for flexion and abd;  Manual: PROM for R shoulder  all motions to tolerance. Post and inf GHJ mobs, Manual pec stretch (light) ; TPR R glute min/med Trigger Point Dry-Needling  Treatment instructions: Expect mild to moderate muscle soreness. S/S of pneumothorax if dry needled over a lung field, and to seek immediate medical attention should they occur. Patient  verbalized understanding of these instructions and education.  Patient Consent Given: Yes Education handout provided: Previously provided Muscles treated: R glute med Electrical stimulation performed: No Parameters: N/A Treatment response/outcome: Twitch response, palpable increase in muscle length.    Marland Kitchen PATIENT EDUCATION:  Education details: reviewed HEP, Person educated: Patient Education method: Explanation, Demonstration, Tactile cues, Verbal cues, and Handouts Education comprehension: verbalized understanding, returned demonstration, verbal cues required, tactile cues required, and needs further education   HOME EXERCISE PROGRAM: Access Code: [email or web address]   - hip Access Code: [email or web address] - shoulder      ASSESSMENT:  CLINICAL IMPRESSION: 04/12/2022 Discussed not over doing activity with shoulder, and taking it easy in next couple days, due to continued soreness at rest. Shoulder progressing very well, will benefit from continued strength and ROM for IR behind back as tolerated.  Pt with soreness in R glute min/med today. Good tolerance for DN in glute today, good twitch response in glute med. Will benefit from continued work on hip pain as needed.   OBJECTIVE IMPAIRMENTS decreased activity tolerance, decreased knowledge of use of DME, decreased mobility, decreased ROM, decreased strength, increased muscle spasms, improper body mechanics, and pain.   ACTIVITY LIMITATIONS carrying, lifting, bending, standing, squatting, stairs, dressing, reach over head, and locomotion level  PARTICIPATION LIMITATIONS: meal prep, cleaning, laundry, driving, shopping, community activity, and yard work  PERSONAL FACTORS Time since onset of injury/illness/exacerbation are also affecting patient's functional outcome.   REHAB POTENTIAL: Good  CLINICAL DECISION MAKING: Stable/uncomplicated  EVALUATION COMPLEXITY: Low   GOALS: Goals reviewed with patient? Yes  SHORT TERM GOALS: Target date:   03/01/22  Pt to be independent with initial HEP  Goal status: MET   2.  Pt to report decrease in pain in glute muscles and soft tissue by at least 50%, for decreased pain.   Goal status: MET    LONG TERM GOALS: Target date: 05/04/22  Pt to be independent with final HEP  Goal status: IN PROGRESS  2.  Pt to report decreased pain in R hip and glute to 0-2/10 with activity.  Goal status: IN PROGRESS  3.  Pt to demo improved strength of bil hips to at least 4+/5 to improve stability and pain.   Goal status: IN PROGRESS  4.  Pt to demo ability for standing/walking activity for at least 30 min without increased pain, to improve ability for community activity.    Goal status: IN PROGRESS  5. PT to demo ability for full shoulder AROM without pain, to improve ability for ADLS.   Goal status: Initial   6. Pt to demo improved strength of R shoulder to at least 4+/5 to improve ability  for IADLS.   Goal status: initial    PLAN: PT FREQUENCY: 1-2x/week  PT DURATION: 6 weeks  PLANNED INTERVENTIONS: Therapeutic exercises, Therapeutic activity, Neuromuscular re-education, Gait training, Patient/Family education, Joint manipulation, Joint mobilization, Stair training, DME instructions, Dry Needling, Electrical stimulation, Spinal manipulation, Spinal mobilization, Cryotherapy, Moist heat, Taping, Traction, Ultrasound, Ionotophoresis 80m/ml Dexamethasone, and Manual therapy  PLAN FOR NEXT SESSION:   LLyndee Hensen PT, DPT 9:16 AM  04/12/22

## 2022-04-17 ENCOUNTER — Encounter: Payer: Self-pay | Admitting: Physical Therapy

## 2022-04-17 ENCOUNTER — Ambulatory Visit (INDEPENDENT_AMBULATORY_CARE_PROVIDER_SITE_OTHER): Payer: 59 | Admitting: Physical Therapy

## 2022-04-17 DIAGNOSIS — M25511 Pain in right shoulder: Secondary | ICD-10-CM

## 2022-04-17 DIAGNOSIS — M25551 Pain in right hip: Secondary | ICD-10-CM | POA: Diagnosis not present

## 2022-04-17 NOTE — Therapy (Signed)
OUTPATIENT PHYSICAL THERAPY TREATMENT   Patient Name: Samantha Becker MRN: [id number] DOB:Feb 21, 1956, 66 y.o., female Today's Date: 04/17/2022   PT End of Session - 04/17/22 0851     Visit Number 13    Number of Visits 20    Date for PT Re-Evaluation 05/04/22    Authorization Type U[employer],  Re-eval  done at visit 6    PT Start Time 0848    PT Stop Time 0930    PT Time Calculation (min) 42 min    Activity Tolerance Patient tolerated treatment well    Behavior During Therapy WFL for tasks assessed/performed                     Past Medical History:  Diagnosis Date   Arthritis    Chicken pox    GERD (gastroesophageal reflux disease)    OTC   Leaking of urine    Low iron    Mumps    Past Surgical History:  Procedure Laterality Date   CESAREAN SECTION     4 cs   LAPAROSCOPY  03/04/2012   Procedure: LAPAROSCOPY OPERATIVE;  Surgeon: Eldred Manges, MD;  Location: [facility];  Service: Gynecology;  Laterality: N/A;  Peritoneum Biopsy   SHOULDER ARTHROSCOPY Right 03/20/2022   Procedure: RIGHT SHOULDER ARTHROSCOPY WITH DISTAL CLAVICLE RESECTION;  Surgeon: Vanetta Mulders, MD;  Location: [place];  Service: Orthopedics;  Laterality: Right;   TUBAL LIGATION Bilateral    Patient Active Problem List   Diagnosis Date Noted   Osteoarthritis of AC (acromioclavicular) joint    Endolymphatic hydrops of right ear 08/18/2020   Right-sided sensorineural hearing loss 08/18/2020   Atrophic vaginitis 03/20/2017   Endometriosis 03/18/2012   Dyspareunia 02/01/2012    PCP: Inda Coke  REFERRING PROVIDER: Ellard Artis corey/ Vanetta Mulders  REFERRING DIAG: R hip pain/ R shoulder pain/ S/P DCE  THERAPY DIAG:  Pain in right hip  Acute pain of right shoulder  Rationale for Evaluation and Treatment Rehabilitation  ONSET DATE:   SUBJECTIVE:   SUBJECTIVE STATEMENT: 04/17/2022 Shoulder doing a bit better, less soreness, slept better last couple nights. Hip  better since last visit with needling. Still overall tight/sore at night.    PERTINENT HISTORY:   PAIN:  Are you having pain? Yes: NPRS scale: 4/10 Pain location: R hip  Pain description: Sore Aggravating factors: late at night, and early AM.  Relieving factors: movement, walking.   Are you having pain? Yes: NPRS scale: 6/10 Pain location: R shoulder  Pain description: Sore Aggravating factors:   movement  Relieving factors: ice  PRECAUTIONS: None  WEIGHT BEARING RESTRICTIONS No  FALLS:  Has patient fallen in last 6 months? No  PLOF: Independent  PATIENT GOALS   decreased pain in R hip. And R shoulder, return to regular use of R arm.     OBJECTIVE: updated 7/28  DIAGNOSTIC FINDINGS: no imaging.    COGNITION:  Overall cognitive status: Within functional limits for tasks assessed      POSTURE: No Significant postural limitations  PALPATION: Tenderness in Gr troch, into R glute med, min, piriformis.    LOWER EXTREMITY ROM  Lumbar: WFL,   Hips: WFL   Shoulder PROM: flex: 90 deg, abd: 90 deg,  IR: 40 , ER: 35 deg   Shoulder AROM: not tested.  8/10  PROM Flex:  150 Abd:  155 ER;    65   LOWER EXTREMITY MMT:  Hips: flex: 4+/5, abd: 4+/5  Shoulder:  not tested.   LOWER EXTREMITY SPECIAL TESTS:  Nadara Mustard,    TODAY'S TREATMENT: 04/17/2022 Ther ex:  supine  flexion AROM full motion x10;  Horiz abd AROM x 10;  Chest press 3 lb 2 x10;  S/L:  Standing: AROM /scaption x10;   Scap squeeze x 10; Rows GTB x 15;  IR behind back ROM x 10; Wall slides 2 UE x 10; 1 UE x 10;  90deg- circles x10 ea cw, ccw;   IR/ER Rtb x 15 ea on R;  Seated:  Manual: PROM for R shoulder  all motions to tolerance. Post and inf GHJ mobs, Manual pec stretch (light) ; TPR R glute min/med    . PATIENT EDUCATION:  Education details: reviewed HEP, Person educated: Patient Education method: Explanation, Demonstration, Tactile cues, Verbal cues, and Handouts Education  comprehension: verbalized understanding, returned demonstration, verbal cues required, tactile cues required, and needs further education   HOME EXERCISE PROGRAM: Access Code: [email or web address]   - hip Access Code: [email or web address] - shoulder      ASSESSMENT:  CLINICAL IMPRESSION: 04/17/2022 Pt progressing well with shoulder. Demonstrates full AROM, with little soreness. She does still have limitation/pain with full Behind the back IR, but also improving. She will benefit from progressive strengthening as tolerated.  Discussed overall progress with hip, recommended that she see sports med for ongoing deficits, I saw her initially almost 1 year ago for this, and she still is not fully improved. Pt in agreement.   OBJECTIVE IMPAIRMENTS decreased activity tolerance, decreased knowledge of use of DME, decreased mobility, decreased ROM, decreased strength, increased muscle spasms, improper body mechanics, and pain.   ACTIVITY LIMITATIONS carrying, lifting, bending, standing, squatting, stairs, dressing, reach over head, and locomotion level  PARTICIPATION LIMITATIONS: meal prep, cleaning, laundry, driving, shopping, community activity, and yard work  PERSONAL FACTORS Time since onset of injury/illness/exacerbation are also affecting patient's functional outcome.   REHAB POTENTIAL: Good  CLINICAL DECISION MAKING: Stable/uncomplicated  EVALUATION COMPLEXITY: Low   GOALS: Goals reviewed with patient? Yes  SHORT TERM GOALS: Target date:  03/01/22  Pt to be independent with initial HEP  Goal status: MET   2.  Pt to report decrease in pain in glute muscles and soft tissue by at least 50%, for decreased pain.   Goal status: MET    LONG TERM GOALS: Target date: 05/04/22  Pt to be independent with final HEP  Goal status: IN PROGRESS  2.  Pt to report decreased pain in R hip and glute to 0-2/10 with activity.  Goal status: IN PROGRESS  3.  Pt to demo improved strength of bil hips to at least 4+/5  to improve stability and pain.   Goal status: IN PROGRESS  4.  Pt to demo ability for standing/walking activity for at least 30 min without increased pain, to improve ability for community activity.    Goal status: IN PROGRESS  5. PT to demo ability for full shoulder AROM without pain, to improve ability for ADLS.   Goal status: Initial   6. Pt to demo improved strength of R shoulder to at least 4+/5 to improve ability for IADLS.   Goal status: initial    PLAN: PT FREQUENCY: 1-2x/week  PT DURATION: 6 weeks  PLANNED INTERVENTIONS: Therapeutic exercises, Therapeutic activity, Neuromuscular re-education, Gait training, Patient/Family education, Joint manipulation, Joint mobilization, Stair training, DME instructions, Dry Needling, Electrical stimulation, Spinal manipulation, Spinal mobilization, Cryotherapy, Moist heat, Taping, Traction, Ultrasound, Ionotophoresis 52m/ml Dexamethasone, and Manual therapy  PLAN  FOR NEXT SESSION:   Lyndee Hensen, PT, DPT 9:12 AM  04/17/22

## 2022-04-19 ENCOUNTER — Encounter: Payer: Self-pay | Admitting: Physical Therapy

## 2022-04-19 ENCOUNTER — Ambulatory Visit (INDEPENDENT_AMBULATORY_CARE_PROVIDER_SITE_OTHER): Payer: 59 | Admitting: Physical Therapy

## 2022-04-19 DIAGNOSIS — M25551 Pain in right hip: Secondary | ICD-10-CM

## 2022-04-19 DIAGNOSIS — M25511 Pain in right shoulder: Secondary | ICD-10-CM

## 2022-04-19 NOTE — Therapy (Signed)
OUTPATIENT PHYSICAL THERAPY TREATMENT   Patient Name: Samantha Becker MRN: [id number] DOB:Dec 14, 1955, 66 y.o., female Today's Date: 04/19/2022   PT End of Session - 04/19/22 0846     Visit Number 14    Number of Visits 20    Date for PT Re-Evaluation 05/04/22    Authorization Type U[employer],  Re-eval  done at visit 6    PT Start Time 0848    PT Stop Time 0927    PT Time Calculation (min) 39 min    Activity Tolerance Patient tolerated treatment well    Behavior During Therapy WFL for tasks assessed/performed                     Past Medical History:  Diagnosis Date   Arthritis    Chicken pox    GERD (gastroesophageal reflux disease)    OTC   Leaking of urine    Low iron    Mumps    Past Surgical History:  Procedure Laterality Date   CESAREAN SECTION     4 cs   LAPAROSCOPY  03/04/2012   Procedure: LAPAROSCOPY OPERATIVE;  Surgeon: Eldred Manges, MD;  Location: [facility];  Service: Gynecology;  Laterality: N/A;  Peritoneum Biopsy   SHOULDER ARTHROSCOPY Right 03/20/2022   Procedure: RIGHT SHOULDER ARTHROSCOPY WITH DISTAL CLAVICLE RESECTION;  Surgeon: Vanetta Mulders, MD;  Location: [place];  Service: Orthopedics;  Laterality: Right;   TUBAL LIGATION Bilateral    Patient Active Problem List   Diagnosis Date Noted   Osteoarthritis of AC (acromioclavicular) joint    Endolymphatic hydrops of right ear 08/18/2020   Right-sided sensorineural hearing loss 08/18/2020   Atrophic vaginitis 03/20/2017   Endometriosis 03/18/2012   Dyspareunia 02/01/2012    PCP: Inda Coke  REFERRING PROVIDER: Ellard Artis corey/ Vanetta Mulders  REFERRING DIAG: R hip pain/ R shoulder pain/ S/P DCE  THERAPY DIAG:  Pain in right hip  Acute pain of right shoulder  Rationale for Evaluation and Treatment Rehabilitation  ONSET DATE:   SUBJECTIVE:   SUBJECTIVE STATEMENT: 04/19/2022 Shoulder doing a bit better, less soreness, slept better last couple nights. Hip  better since last visit with needling. Still overall tight/sore at night.    PERTINENT HISTORY:   PAIN:  Are you having pain? Yes: NPRS scale: 4/10 Pain location: R hip  Pain description: Sore Aggravating factors: late at night, and early AM.  Relieving factors: movement, walking.   Are you having pain? Yes: NPRS scale: 6/10 Pain location: R shoulder  Pain description: Sore Aggravating factors:   movement  Relieving factors: ice  PRECAUTIONS: None  WEIGHT BEARING RESTRICTIONS No  FALLS:  Has patient fallen in last 6 months? No  PLOF: Independent  PATIENT GOALS   decreased pain in R hip. And R shoulder, return to regular use of R arm.     OBJECTIVE: updated 7/28  DIAGNOSTIC FINDINGS: no imaging.    COGNITION:  Overall cognitive status: Within functional limits for tasks assessed      POSTURE: No Significant postural limitations  PALPATION: Tenderness in Gr troch, into R glute med, min, piriformis.    LOWER EXTREMITY ROM  Lumbar: WFL,   Hips: WFL   Shoulder PROM: flex: 90 deg, abd: 90 deg,  IR: 40 , ER: 35 deg   Shoulder AROM: not tested.  8/10  PROM Flex:  150 Abd:  155 ER;    65   LOWER EXTREMITY MMT:  Hips: flex: 4+/5, abd: 4+/5  Shoulder:  not tested.   LOWER EXTREMITY SPECIAL TESTS:  Neg Benjamine Mola,    TODAY'S TREATMENT:  04/19/2022 Ther ex:  supine   Horiz abd AROM x 15;  Chest press 3 lb 2 x10;  pec stretch 30 sec x 4 bil;  S/L: Standing: AROM /scaption 1lb x10;   abd full ROM x 15;  Rows GTB x 15;  Wall slides 2 UE x 10; 1 UE x 10;  90deg- circles x10 ea cw, ccw;   IR/ER Rtb x 15 ea on R; SA presses at wall x 15;  Seated:  Manual: PROM for R shoulder all motions to tolerance. Post and inf GHJ mobs, Manual pec stretch (light) ;   . PATIENT EDUCATION:  Education details: reviewed HEP, Person educated: Patient Education method: Explanation, Demonstration, Tactile cues, Verbal cues, and Handouts Education comprehension: verbalized  understanding, returned demonstration, verbal cues required, tactile cues required, and needs further education   HOME EXERCISE PROGRAM: Access Code: [email or web address]   - hip Access Code: [email or web address] - shoulder      ASSESSMENT:  CLINICAL IMPRESSION: 04/19/2022 Pt progressing well . She has improving pain levels this week, taking less tylenol daily. Near full AROM today, mild pain at end range for elevation and behind back. Plan to continued at 1/wk for ROM and strengthening.   OBJECTIVE IMPAIRMENTS decreased activity tolerance, decreased knowledge of use of DME, decreased mobility, decreased ROM, decreased strength, increased muscle spasms, improper body mechanics, and pain.   ACTIVITY LIMITATIONS carrying, lifting, bending, standing, squatting, stairs, dressing, reach over head, and locomotion level  PARTICIPATION LIMITATIONS: meal prep, cleaning, laundry, driving, shopping, community activity, and yard work  PERSONAL FACTORS Time since onset of injury/illness/exacerbation are also affecting patient's functional outcome.   REHAB POTENTIAL: Good  CLINICAL DECISION MAKING: Stable/uncomplicated  EVALUATION COMPLEXITY: Low   GOALS: Goals reviewed with patient? Yes  SHORT TERM GOALS: Target date:  03/01/22  Pt to be independent with initial HEP  Goal status: MET   2.  Pt to report decrease in pain in glute muscles and soft tissue by at least 50%, for decreased pain.   Goal status: MET    LONG TERM GOALS: Target date: 05/04/22  Pt to be independent with final HEP  Goal status: IN PROGRESS  2.  Pt to report decreased pain in R hip and glute to 0-2/10 with activity.  Goal status: IN PROGRESS  3.  Pt to demo improved strength of bil hips to at least 4+/5 to improve stability and pain.   Goal status: IN PROGRESS  4.  Pt to demo ability for standing/walking activity for at least 30 min without increased pain, to improve ability for community activity.    Goal status: IN  PROGRESS  5. PT to demo ability for full shoulder AROM without pain, to improve ability for ADLS.   Goal status: Initial   6. Pt to demo improved strength of R shoulder to at least 4+/5 to improve ability for IADLS.   Goal status: initial    PLAN: PT FREQUENCY: 1-2x/week  PT DURATION: 6 weeks  PLANNED INTERVENTIONS: Therapeutic exercises, Therapeutic activity, Neuromuscular re-education, Gait training, Patient/Family education, Joint manipulation, Joint mobilization, Stair training, DME instructions, Dry Needling, Electrical stimulation, Spinal manipulation, Spinal mobilization, Cryotherapy, Moist heat, Taping, Traction, Ultrasound, Ionotophoresis 80m/ml Dexamethasone, and Manual therapy  PLAN FOR NEXT SESSION:   LLyndee Hensen PT, DPT 9:36 AM  04/19/22

## 2022-04-23 ENCOUNTER — Telehealth (INDEPENDENT_AMBULATORY_CARE_PROVIDER_SITE_OTHER): Payer: 59 | Admitting: Family

## 2022-04-23 ENCOUNTER — Encounter: Payer: Self-pay | Admitting: Physician Assistant

## 2022-04-23 VITALS — Temp 100.0°F | Ht 62.0 in | Wt 128.0 lb

## 2022-04-23 DIAGNOSIS — U071 COVID-19: Secondary | ICD-10-CM | POA: Diagnosis not present

## 2022-04-23 MED ORDER — BENZONATATE 200 MG PO CAPS: 200.0000 mg | ORAL_CAPSULE | Freq: Three times a day (TID) | ORAL | 0 refills | Status: AC | PRN

## 2022-04-23 NOTE — Progress Notes (Signed)
MyChart Video Visit    Virtual Visit via Video Note   This format is felt to be most appropriate for this patient at this time. Physical exam was limited by quality of the video and audio technology used for the visit. CMA was able to get the patient set up on a video visit.  Patient location: Home. Patient and provider in visit Provider location: Office  I discussed the limitations of evaluation and management by telemedicine and the availability of in person appointments. The patient expressed understanding and agreed to proceed.  Visit Date: 04/23/2022  Today's healthcare provider: Jeanie Sewer, NP     Subjective:   Patient ID: Samantha Becker, female    DOB: 1955/12/23, 66 y.o.   MRN: [id number]  Chief Complaint  Patient presents with   Covid Positive    Pt tested positive for Covid this morning, symptoms include congestion cough sore throat and fatigue. This is first time having Covid, symptoms started yesterday morning;     HPI: Upper Respiratory Infection: Symptoms include  nasal congestion, sore throat, fatigue, and dry cough . Denies fever, chills, body aches Onset of symptoms was 1 day ago, gradually worsening since that time. She is drinking moderate amounts of fluids. Evaluation to date: none. Treatment to date: Sudafed.   Assessment & Plan:  1. COVID-19 - pt with mild sx, no comorbidities other than age. pt has received Covid vaccines x 2 and booster last year. Discussed current antiviral meds, pros/cons of taking, pt would like to treat conservatively. Advised ok to continue Sudafed, Tylenol or Advil prn, and sending Tessalon pearles, advised on use & SE. Drink at least 2L of water qd, wear a mask if out in public for next 4 days.  - benzonatate (TESSALON) 200 MG capsule; Take 1 capsule (200 mg total) by mouth 3 (three) times daily as needed for up to 10 days for cough.  Dispense: 30 capsule; Refill: 0   Past Medical History:  Diagnosis Date    Arthritis    Chicken pox    GERD (gastroesophageal reflux disease)    OTC   Leaking of urine    Low iron    Mumps     Past Surgical History:  Procedure Laterality Date   CESAREAN SECTION     4 cs   LAPAROSCOPY  03/04/2012   Procedure: LAPAROSCOPY OPERATIVE;  Surgeon: Eldred Manges, MD;  Location: [facility];  Service: Gynecology;  Laterality: N/A;  Peritoneum Biopsy   SHOULDER ARTHROSCOPY Right 03/20/2022   Procedure: RIGHT SHOULDER ARTHROSCOPY WITH DISTAL CLAVICLE RESECTION;  Surgeon: Vanetta Mulders, MD;  Location: [place];  Service: Orthopedics;  Laterality: Right;   TUBAL LIGATION Bilateral     Outpatient Medications Prior to Visit  Medication Sig Dispense Refill   aspirin EC 325 MG tablet TAKE 1 TABLET BY MOUTH EVERY DAY 30 tablet 0   conjugated estrogens (PREMARIN) vaginal cream Premarin 0.625 mg/gram vaginal cream  Insert 0.5 applicatorsful twice a week by vaginal route. 42.5 g 2   diclofenac Sodium (VOLTAREN) 1 % GEL Apply 2 g topically as needed.     fluticasone (FLONASE) 50 MCG/ACT nasal spray Place 1 spray into both nostrils as needed for allergies or rhinitis.     ibuprofen (ADVIL) 200 MG tablet Take 800 mg by mouth every 6 (six) hours as needed.     triamcinolone cream (KENALOG) 0.1 % Apply to affected area 1-2 times daily 30 g 0   No facility-administered medications  prior to visit.    Allergies  Allergen Reactions   Morphine Nausea Only      Objective:   Physical Exam Vitals and nursing note reviewed.  Constitutional:      General: She is not in acute distress.    Appearance: Normal appearance.  HENT:     Head: Normocephalic.  Pulmonary:     Effort: No respiratory distress.  Musculoskeletal:     Cervical back: Normal range of motion.  Skin:    General: Skin is dry.     Coloration: Skin is not pale.  Neurological:     Mental Status: She is alert and oriented to person, place, and time.  Psychiatric:        Mood and Affect: Mood  normal.   Temp 100 F (37.8 C) (Oral)   Ht '5\' 2"'$  (1.575 m)   Wt 128 lb (58.1 kg)   LMP  (LMP Unknown)   BMI 23.41 kg/m   Wt Readings from Last 3 Encounters:  04/23/22 128 lb (58.1 kg)  03/20/22 128 lb 8.5 oz (58.3 kg)  02/28/22 132 lb 6.1 oz (60 kg)       I discussed the assessment and treatment plan with the patient. The patient was provided an opportunity to ask questions and all were answered. The patient agreed with the plan and demonstrated an understanding of the instructions.   The patient was advised to call back or seek an in-person evaluation if the symptoms worsen or if the condition fails to improve as anticipated.  Jeanie Sewer, NP [place] [phone] (phone) [phone] (fax)  [place]

## 2022-04-24 ENCOUNTER — Encounter: Payer: 59 | Admitting: Physical Therapy

## 2022-04-26 ENCOUNTER — Encounter: Payer: 59 | Admitting: Physical Therapy

## 2022-04-27 ENCOUNTER — Ambulatory Visit (INDEPENDENT_AMBULATORY_CARE_PROVIDER_SITE_OTHER): Payer: 59 | Admitting: Orthopaedic Surgery

## 2022-04-27 DIAGNOSIS — M19019 Primary osteoarthritis, unspecified shoulder: Secondary | ICD-10-CM

## 2022-04-27 DIAGNOSIS — M19011 Primary osteoarthritis, right shoulder: Secondary | ICD-10-CM

## 2022-04-27 NOTE — Progress Notes (Signed)
Post Operative Evaluation    Procedure/Date of Surgery: Right shoulder acromioplasty with distal clavicle resection 03/20/22  Interval History:  Presents today 6 weeks status post right shoulder distal clavicle resection.  She states that she is improving daily.  She does have some soreness around her incision anteriorly but otherwise doing quite well.  She has some soreness with cross she has been doing scar massage for this.   PMH/PSH/Family History/Social History/Meds/Allergies:    Past Medical History:  Diagnosis Date   Arthritis    Chicken pox    GERD (gastroesophageal reflux disease)    OTC   Leaking of urine    Low iron    Mumps    Past Surgical History:  Procedure Laterality Date   CESAREAN SECTION     4 cs   LAPAROSCOPY  03/04/2012   Procedure: LAPAROSCOPY OPERATIVE;  Surgeon: Eldred Manges, MD;  Location: [facility];  Service: Gynecology;  Laterality: N/A;  Peritoneum Biopsy   SHOULDER ARTHROSCOPY Right 03/20/2022   Procedure: RIGHT SHOULDER ARTHROSCOPY WITH DISTAL CLAVICLE RESECTION;  Surgeon: Vanetta Mulders, MD;  Location: [place];  Service: Orthopedics;  Laterality: Right;   TUBAL LIGATION Bilateral    Social History   Socioeconomic History   Marital status: Married    Spouse name: Not on file   Number of children: Not on file   Years of education: Not on file   Highest education level: Not on file  Occupational History   Occupation: TEACHER     Employer: [employer]  Tobacco Use   Smoking status: Never   Smokeless tobacco: Never  Substance and Sexual Activity   Alcohol use: Yes    Alcohol/week: 4.0 standard drinks of alcohol    Types: 2 Glasses of wine, 2 Cans of beer per week    Comment: wine nightly   Drug use: No   Sexual activity: Yes    Birth control/protection: Surgical, Post-menopausal    Comment: BTL  Other Topics Concern   Not on file  Social History Narrative   Prior Pharmacist, hospital at [employer], 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   Social Determinants of Health   Financial Resource Strain: Not on file  Food Insecurity: Not on file  Transportation Needs: Not on file  Physical Activity: Not on file  Stress: Not on file  Social Connections: Not on file   Family History  Problem Relation Age of Onset   Heart disease Father    Hypertension Father    Cancer Father    Stroke Father    Heart disease Mother    Hypertension Mother    Arthritis Mother    Breast cancer Mother    Osteoporosis Mother    Arthritis Sister    Asthma Sister    Allergies  Allergen Reactions   Morphine Nausea Only   Current Outpatient Medications  Medication Sig Dispense Refill   aspirin EC 325 MG tablet TAKE 1 TABLET BY MOUTH EVERY DAY 30 tablet 0   benzonatate (TESSALON) 200 MG capsule Take 1 capsule (200 mg total) by mouth 3 (three) times daily as needed for up to 10 days for cough. 30 capsule 0   conjugated estrogens (PREMARIN) vaginal cream Premarin 0.625 mg/gram vaginal cream  Insert 0.5 applicatorsful twice a week by vaginal route. 42.5 g 2  diclofenac Sodium (VOLTAREN) 1 % GEL Apply 2 g topically as needed.     fluticasone (FLONASE) 50 MCG/ACT nasal spray Place 1 spray into both nostrils as needed for allergies or rhinitis.     ibuprofen (ADVIL) 200 MG tablet Take 800 mg by mouth every 6 (six) hours as needed.     triamcinolone cream (KENALOG) 0.1 % Apply to affected area 1-2 times daily 30 g 0   No current facility-administered medications for this visit.   No results found.  Review of Systems:   A ROS was performed including pertinent positives and negatives as documented in the HPI.   Musculoskeletal Exam:    There were no vitals taken for this visit.  Right portals are well-appearing without erythema or drainage.  Active forward elevation is to approximately 165.  External rotation at the side is to 60 degrees and these are both equal to contralateral side.  Internal rotation is to L1  bilaterally.  Distal neurosensory exam is intact with 2+ radial pulse.  Imaging:    None  I personally reviewed and interpreted the radiographs.   Assessment:   6 weeks status post right shoulder distal clavicle resection overall doing very well.  She will finish physical therapy in the next few weeks.  I will plan to see her back for final check in 6 weeks.  Plan :    -Return to clinic in 6 weeks      I personally saw and evaluated the patient, and participated in the management and treatment plan.  Vanetta Mulders, MD Attending Physician, Orthopedic Surgery  This document was dictated using Dragon voice recognition software. A reasonable attempt at proof reading has been made to minimize errors.

## 2022-05-01 ENCOUNTER — Encounter (HOSPITAL_BASED_OUTPATIENT_CLINIC_OR_DEPARTMENT_OTHER): Payer: Self-pay

## 2022-05-08 ENCOUNTER — Ambulatory Visit: Payer: 59

## 2022-05-09 ENCOUNTER — Encounter: Payer: Self-pay | Admitting: Physical Therapy

## 2022-05-09 ENCOUNTER — Ambulatory Visit (INDEPENDENT_AMBULATORY_CARE_PROVIDER_SITE_OTHER): Payer: 59 | Admitting: Physical Therapy

## 2022-05-09 ENCOUNTER — Ambulatory Visit
Admission: RE | Admit: 2022-05-09 | Discharge: 2022-05-09 | Disposition: A | Discharge status: Home / Self Care | Payer: 59 | Source: Ambulatory Visit | Attending: Physician Assistant | Admitting: Physician Assistant

## 2022-05-09 DIAGNOSIS — M25551 Pain in right hip: Secondary | ICD-10-CM | POA: Diagnosis not present

## 2022-05-09 DIAGNOSIS — M25511 Pain in right shoulder: Secondary | ICD-10-CM | POA: Diagnosis not present

## 2022-05-09 DIAGNOSIS — Z1231 Encounter for screening mammogram for malignant neoplasm of breast: Secondary | ICD-10-CM

## 2022-05-09 NOTE — Therapy (Signed)
OUTPATIENT PHYSICAL THERAPY TREATMENT/RE-Cert   Patient Name: Samantha Becker MRN: [id number] DOB:09/28/1955, 66 y.o., female Today's Date: 05/09/2022   PT End of Session - 05/09/22 1200     Visit Number 15    Number of Visits 20    Date for PT Re-Evaluation 06/20/22    Authorization Type [employer],  Re-eval  done at visit 6, visit 15.    PT Start Time 1106    PT Stop Time 1144    PT Time Calculation (min) 38 min    Activity Tolerance Patient tolerated treatment well    Behavior During Therapy WFL for tasks assessed/performed                      Past Medical History:  Diagnosis Date   Arthritis    Chicken pox    GERD (gastroesophageal reflux disease)    OTC   Leaking of urine    Low iron    Mumps    Past Surgical History:  Procedure Laterality Date   CESAREAN SECTION     4 cs   LAPAROSCOPY  03/04/2012   Procedure: LAPAROSCOPY OPERATIVE;  Surgeon: Eldred Manges, MD;  Location: [facility];  Service: Gynecology;  Laterality: N/A;  Peritoneum Biopsy   SHOULDER ARTHROSCOPY Right 03/20/2022   Procedure: RIGHT SHOULDER ARTHROSCOPY WITH DISTAL CLAVICLE RESECTION;  Surgeon: Vanetta Mulders, MD;  Location: [facility];  Service: Orthopedics;  Laterality: Right;   TUBAL LIGATION Bilateral    Patient Active Problem List   Diagnosis Date Noted   Osteoarthritis of AC (acromioclavicular) joint    Endolymphatic hydrops of right ear 08/18/2020   Right-sided sensorineural hearing loss 08/18/2020   Atrophic vaginitis 03/20/2017   Endometriosis 03/18/2012   Dyspareunia 02/01/2012    PCP: Inda Coke  REFERRING PROVIDER: Ellard Artis corey/ Vanetta Mulders  REFERRING DIAG: R hip pain/ R shoulder pain/ S/P DCE  THERAPY DIAG:  Pain in right hip  Acute pain of right shoulder  Rationale for Evaluation and Treatment Rehabilitation  ONSET DATE:   SUBJECTIVE:   SUBJECTIVE STATEMENT: 05/09/2022 Pt last seen 8/24. She was out of town and also was sick. She  is now 7 weeks post op shoulder arthroscopy for DCE. She reports no longer taking medication for pain, but still has soreness daily, increased in AM, with increased movement of arm.    PERTINENT HISTORY:   PAIN:  Are you having pain? Yes: NPRS scale: 4/10 Pain location: R hip  Pain description: Sore Aggravating factors: late at night, and early AM.  Relieving factors: movement, walking.   Are you having pain? Yes: NPRS scale: 6/10 Pain location: R shoulder  Pain description: Sore Aggravating factors:   movement  Relieving factors: ice  PRECAUTIONS: None  WEIGHT BEARING RESTRICTIONS No  FALLS:  Has patient fallen in last 6 months? No  PLOF: Independent  PATIENT GOALS   decreased pain in R hip. And R shoulder, return to regular use of R arm.     OBJECTIVE: updated 9/13  DIAGNOSTIC FINDINGS: no imaging.    COGNITION:  Overall cognitive status: Within functional limits for tasks assessed      POSTURE: No Significant postural limitations  PALPATION: Tenderness in Gr troch, into R glute med, min, piriformis.    LOWER EXTREMITY ROM  Lumbar: WFL,   Hips: WFL   Shoulder PROM: flex: 155 deg, abd:150 deg,  IR:wfl , ER: 60 deg   Shoulder AROM: mild limitation for end range flex and ER  LOWER EXTREMITY MMT:  Hips: flex: 4+/5, abd: 4+/5  Shoulder: flex/abd 4/5,  IR/ER 4+/5 in mid range.   LOWER EXTREMITY SPECIAL TESTS:  Johnnette Barrios, Fader,    TODAY'S TREATMENT:  05/09/2022 Ther ex:  supine   Horiz abd AROM x 15; Shoulder flexion/cane x 10; Shoulder ER/cane x 10;   pec stretch 30 sec x 3 bil; ER/IR 3lb x 15;  S/L:  Standing: AROM flexion/full ROM x 15;   IR/ER Rtb x 15 ea on R;  Seated:  Manual: PROM for R shoulder all motions to tolerance. Post and inf GHJ mobs, Manual pec stretch (light) ; light sub scap release.   Marland Kitchen PATIENT EDUCATION:  Education details: reviewed and updated HEP, Person educated: Patient Education method: Explanation, Demonstration,  Tactile cues, Verbal cues, and Handouts Education comprehension: verbalized understanding, returned demonstration, verbal cues required, tactile cues required, and needs further education   HOME EXERCISE PROGRAM: Access Code: [email or web address]   - hip Access Code: [email or web address] - shoulder      ASSESSMENT:  CLINICAL IMPRESSION: 02/17/7627 Re-Cert  Pt making good progress with ROM, strength , and regaining functional use of arm. She does continue to have bothersome soreness, that is limiting activity. She has improving ROM, but still has limitations for end range flexion, ER, and behind the back IR, all with pain. She has soreness with most motions and exercises today. Discussed keeping activity light this week, and updated HEP for AAROM and light AROM. Pt to benefit from continued care, to continue to improve pain, and return to PLOF. She reports that her hip is doing ok at this time, has continued to do HEP for that.   OBJECTIVE IMPAIRMENTS decreased activity tolerance, decreased knowledge of use of DME, decreased mobility, decreased ROM, decreased strength, increased muscle spasms, improper body mechanics, and pain.   ACTIVITY LIMITATIONS carrying, lifting, bending, standing, squatting, stairs, dressing, reach over head, and locomotion level  PARTICIPATION LIMITATIONS: meal prep, cleaning, laundry, driving, shopping, community activity, and yard work  PERSONAL FACTORS Time since onset of injury/illness/exacerbation are also affecting patient's functional outcome.   REHAB POTENTIAL: Good  CLINICAL DECISION MAKING: Stable/uncomplicated  EVALUATION COMPLEXITY: Low   GOALS: Goals reviewed with patient? Yes  SHORT TERM GOALS: Target date:  03/01/22  Pt to be independent with initial HEP  Goal status: MET   2.  Pt to report decrease in pain in glute muscles and soft tissue by at least 50%, for decreased pain.   Goal status: MET    LONG TERM GOALS: Target date: 06/20/22  Pt to be independent  with final HEP  Goal status: IN PROGRESS  2.  Pt to report decreased pain in R hip and glute to 0-2/10 with activity.  Goal status: IN PROGRESS  3.  Pt to demo improved strength of bil hips to at least 4+/5 to improve stability and pain.   Goal status: IN PROGRESS  4.  Pt to demo ability for standing/walking activity for at least 30 min without increased pain, to improve ability for community activity.    Goal status: IN PROGRESS  5. PT to demo ability for full shoulder AROM without pain, to improve ability for ADLS.   Goal status: in progress  6. Pt to demo improved strength of R shoulder to at least 4+/5 to improve ability for IADLS.   Goal status: in progress      PLAN: PT FREQUENCY: 1-2x/week  PT DURATION: 6 weeks  PLANNED INTERVENTIONS: Therapeutic exercises, Therapeutic activity, Neuromuscular re-education,  Gait training, Patient/Family education, Joint manipulation, Joint mobilization, Stair training, DME instructions, Dry Needling, Electrical stimulation, Spinal manipulation, Spinal mobilization, Cryotherapy, Moist heat, Taping, Traction, Ultrasound, Ionotophoresis 4m/ml Dexamethasone, and Manual therapy  PLAN FOR NEXT SESSION:   LLyndee Hensen PT, DPT 12:05 PM  05/09/22

## 2022-05-16 ENCOUNTER — Ambulatory Visit (INDEPENDENT_AMBULATORY_CARE_PROVIDER_SITE_OTHER): Payer: 59 | Admitting: Physical Therapy

## 2022-05-16 ENCOUNTER — Encounter: Payer: Self-pay | Admitting: Physical Therapy

## 2022-05-16 DIAGNOSIS — M25551 Pain in right hip: Secondary | ICD-10-CM

## 2022-05-16 DIAGNOSIS — M25511 Pain in right shoulder: Secondary | ICD-10-CM | POA: Diagnosis not present

## 2022-05-16 NOTE — Therapy (Signed)
OUTPATIENT PHYSICAL THERAPY TREATMENT   Patient Name: VILLA BURGIN MRN: [id number] DOB:07-05-1956, 66 y.o., female Today's Date: 05/16/2022   PT End of Session - 05/16/22 0853     Visit Number 16    Number of Visits 20    Date for PT Re-Evaluation 06/20/22    Authorization Type [employer],  Re-eval  done at visit 6, visit 15.    PT Start Time [phone]    PT Stop Time 0930    PT Time Calculation (min) 40 min    Activity Tolerance Patient tolerated treatment well    Behavior During Therapy WFL for tasks assessed/performed                       Past Medical History:  Diagnosis Date   Arthritis    Chicken pox    GERD (gastroesophageal reflux disease)    OTC   Leaking of urine    Low iron    Mumps    Past Surgical History:  Procedure Laterality Date   CESAREAN SECTION     4 cs   LAPAROSCOPY  03/04/2012   Procedure: LAPAROSCOPY OPERATIVE;  Surgeon: Eldred Manges, MD;  Location: [facility];  Service: Gynecology;  Laterality: N/A;  Peritoneum Biopsy   SHOULDER ARTHROSCOPY Right 03/20/2022   Procedure: RIGHT SHOULDER ARTHROSCOPY WITH DISTAL CLAVICLE RESECTION;  Surgeon: Vanetta Mulders, MD;  Location: [facility];  Service: Orthopedics;  Laterality: Right;   TUBAL LIGATION Bilateral    Patient Active Problem List   Diagnosis Date Noted   Osteoarthritis of AC (acromioclavicular) joint    Endolymphatic hydrops of right ear 08/18/2020   Right-sided sensorineural hearing loss 08/18/2020   Atrophic vaginitis 03/20/2017   Endometriosis 03/18/2012   Dyspareunia 02/01/2012    PCP: Inda Coke  REFERRING PROVIDER: Ellard Artis corey/ Vanetta Mulders  REFERRING DIAG: R hip pain/ R shoulder pain/ S/P DCE  THERAPY DIAG:  Pain in right hip  Acute pain of right shoulder  Rationale for Evaluation and Treatment Rehabilitation  ONSET DATE:   SUBJECTIVE:   SUBJECTIVE STATEMENT: 05/16/2022 Pt states doing ok today today, still sore under incisions. Did more  walking, hip also more sore from that.     PERTINENT HISTORY:   PAIN:  Are you having pain? Yes: NPRS scale: 4/10 Pain location: R hip  Pain description: Sore Aggravating factors: late at night, and early AM.  Relieving factors: movement, walking.   Are you having pain? Yes: NPRS scale: 6/10 Pain location: R shoulder  Pain description: Sore Aggravating factors:   movement  Relieving factors: ice  PRECAUTIONS: None  WEIGHT BEARING RESTRICTIONS No  FALLS:  Has patient fallen in last 6 months? No  PLOF: Independent  PATIENT GOALS   decreased pain in R hip. And R shoulder, return to regular use of R arm.     OBJECTIVE: updated 9/13  DIAGNOSTIC FINDINGS: no imaging.    COGNITION:  Overall cognitive status: Within functional limits for tasks assessed      POSTURE: No Significant postural limitations  PALPATION: Tenderness in Gr troch, into R glute med, min, piriformis.    LOWER EXTREMITY ROM  Lumbar: WFL,   Hips: WFL   Shoulder PROM: flex: 155 deg, abd:150 deg,  IR:wfl , ER: 60 deg   Shoulder AROM: mild limitation for end range flex and ER   LOWER EXTREMITY MMT:  Hips: flex: 4+/5, abd: 4+/5  Shoulder: flex/abd 4/5,  IR/ER 4+/5 in mid range.   LOWER  EXTREMITY SPECIAL TESTS:  Neg Benjamine Mola,    TODAY'S TREATMENT:  05/16/2022 Ther ex:  Aerobic: UBE bwd x 4 min Supine   Horiz abd AROM x 15; Shoulder flexion/cane x 10;   pec stretch 30 sec x 3 bil;  S/L:   ER 2 lb 2x10 on R;  Standing: AROM flexion/full ROM x 15;  Rows GTB x 20;  IR/ER Rtb x 15 ea on R; Review of upright row and bent over row for exercise class x 15 ea;  Seated:  Manual: PROM for R shoulder all motions to tolerance. Post and inf GHJ mobs, STM to [facility] joint /clavicle region over incisions (well healed) .  Marland Kitchen PATIENT EDUCATION:  Education details: reviewed and updated HEP, Person educated: Patient Education method: Explanation, Demonstration, Tactile cues, Verbal cues, and  Handouts Education comprehension: verbalized understanding, returned demonstration, verbal cues required, tactile cues required, and needs further education   HOME EXERCISE PROGRAM: Access Code: [email or web address]   - hip Access Code: [email or web address] - shoulder     ASSESSMENT:  CLINICAL IMPRESSION: 05/16/2022  Pt with improving ability for activity, and improving function. She has improving ROM, still sore and some limitation at end range for flex and ER. Will continue to focus on restoring full/painfree ROM as tolerated.   OBJECTIVE IMPAIRMENTS decreased activity tolerance, decreased knowledge of use of DME, decreased mobility, decreased ROM, decreased strength, increased muscle spasms, improper body mechanics, and pain.   ACTIVITY LIMITATIONS carrying, lifting, bending, standing, squatting, stairs, dressing, reach over head, and locomotion level  PARTICIPATION LIMITATIONS: meal prep, cleaning, laundry, driving, shopping, community activity, and yard work  PERSONAL FACTORS Time since onset of injury/illness/exacerbation are also affecting patient's functional outcome.   REHAB POTENTIAL: Good  CLINICAL DECISION MAKING: Stable/uncomplicated  EVALUATION COMPLEXITY: Low   GOALS: Goals reviewed with patient? Yes  SHORT TERM GOALS: Target date:  03/01/22  Pt to be independent with initial HEP  Goal status: MET   2.  Pt to report decrease in pain in glute muscles and soft tissue by at least 50%, for decreased pain.   Goal status: MET    LONG TERM GOALS: Target date: 06/20/22  Pt to be independent with final HEP  Goal status: IN PROGRESS  2.  Pt to report decreased pain in R hip and glute to 0-2/10 with activity.  Goal status: IN PROGRESS  3.  Pt to demo improved strength of bil hips to at least 4+/5 to improve stability and pain.   Goal status: IN PROGRESS  4.  Pt to demo ability for standing/walking activity for at least 30 min without increased pain, to improve ability for community  activity.    Goal status: IN PROGRESS  5. PT to demo ability for full shoulder AROM without pain, to improve ability for ADLS.   Goal status: in progress  6. Pt to demo improved strength of R shoulder to at least 4+/5 to improve ability for IADLS.   Goal status: in progress     PLAN: PT FREQUENCY: 1-2x/week  PT DURATION: 6 weeks  PLANNED INTERVENTIONS: Therapeutic exercises, Therapeutic activity, Neuromuscular re-education, Gait training, Patient/Family education, Joint manipulation, Joint mobilization, Stair training, DME instructions, Dry Needling, Electrical stimulation, Spinal manipulation, Spinal mobilization, Cryotherapy, Moist heat, Taping, Traction, Ultrasound, Ionotophoresis 49m/ml Dexamethasone, and Manual therapy  PLAN FOR NEXT SESSION:   LLyndee Hensen PT, DPT 2:06 PM  05/16/22

## 2022-05-21 ENCOUNTER — Encounter: Payer: Self-pay | Admitting: *Deleted

## 2022-05-23 ENCOUNTER — Ambulatory Visit (INDEPENDENT_AMBULATORY_CARE_PROVIDER_SITE_OTHER): Payer: 59 | Admitting: Physical Therapy

## 2022-05-23 ENCOUNTER — Encounter: Payer: Self-pay | Admitting: Physical Therapy

## 2022-05-23 DIAGNOSIS — M25551 Pain in right hip: Secondary | ICD-10-CM

## 2022-05-23 DIAGNOSIS — M25511 Pain in right shoulder: Secondary | ICD-10-CM

## 2022-05-23 NOTE — Therapy (Signed)
OUTPATIENT PHYSICAL THERAPY TREATMENT   Patient Name: Samantha Becker MRN: [id number] DOB:09-17-1955, 66 y.o., female Today's Date: 05/23/2022   PT End of Session - 05/23/22 0854     Visit Number 17    Number of Visits 20    Date for PT Re-Evaluation 06/20/22    Authorization Type [employer],  Re-eval  done at visit 6, visit 15.    PT Start Time [phone]    PT Stop Time 0930    PT Time Calculation (min) 40 min    Activity Tolerance Patient tolerated treatment well    Behavior During Therapy WFL for tasks assessed/performed                       Past Medical History:  Diagnosis Date   Arthritis    Chicken pox    GERD (gastroesophageal reflux disease)    OTC   Leaking of urine    Low iron    Mumps    Past Surgical History:  Procedure Laterality Date   CESAREAN SECTION     4 cs   LAPAROSCOPY  03/04/2012   Procedure: LAPAROSCOPY OPERATIVE;  Surgeon: Eldred Manges, MD;  Location: [facility];  Service: Gynecology;  Laterality: N/A;  Peritoneum Biopsy   SHOULDER ARTHROSCOPY Right 03/20/2022   Procedure: RIGHT SHOULDER ARTHROSCOPY WITH DISTAL CLAVICLE RESECTION;  Surgeon: Vanetta Mulders, MD;  Location: [place];  Service: Orthopedics;  Laterality: Right;   TUBAL LIGATION Bilateral    Patient Active Problem List   Diagnosis Date Noted   Osteoarthritis of AC (acromioclavicular) joint    Endolymphatic hydrops of right ear 08/18/2020   Right-sided sensorineural hearing loss 08/18/2020   Atrophic vaginitis 03/20/2017   Endometriosis 03/18/2012   Dyspareunia 02/01/2012    PCP: Inda Coke  REFERRING PROVIDER: Ellard Artis corey/ Vanetta Mulders  REFERRING DIAG: R hip pain/ R shoulder pain/ S/P DCE  THERAPY DIAG:  Pain in right hip  Acute pain of right shoulder  Rationale for Evaluation and Treatment Rehabilitation  ONSET DATE:   SUBJECTIVE:   SUBJECTIVE STATEMENT: 05/24/2022 Pt states continued soreness with certain motions. Soreness to touch  top incision.   PERTINENT HISTORY:   PAIN:  Are you having pain? Yes: NPRS scale: 4/10 Pain location: R hip  Pain description: Sore Aggravating factors: late at night, and early AM.  Relieving factors: movement, walking.   Are you having pain? Yes: NPRS scale: 6/10 Pain location: R shoulder  Pain description: Sore Aggravating factors:   movement  Relieving factors: ice  PRECAUTIONS: None  WEIGHT BEARING RESTRICTIONS No  FALLS:  Has patient fallen in last 6 months? No  PLOF: Independent  PATIENT GOALS   decreased pain in R hip. And R shoulder, return to regular use of R arm.     OBJECTIVE: updated 9/13  DIAGNOSTIC FINDINGS: no imaging.    COGNITION:  Overall cognitive status: Within functional limits for tasks assessed      POSTURE: No Significant postural limitations  PALPATION: Tenderness in Gr troch, into R glute med, min, piriformis.    LOWER EXTREMITY ROM  Lumbar: WFL,   Hips: WFL   Shoulder PROM: flex: 155 deg, abd:150 deg,  IR:wfl , ER: 60 deg   Shoulder AROM: mild limitation for end range flex and ER   LOWER EXTREMITY MMT:  Hips: flex: 4+/5, abd: 4+/5  Shoulder: flex/abd 4/5,  IR/ER 4+/5 in mid range.   LOWER EXTREMITY SPECIAL TESTS:  Johnnette Barrios, Fader,  TODAY'S TREATMENT:  05/23/2022 Ther ex:  Aerobic: UBE bwd x 4 min Supine   Horiz abd AROM x 15; Shoulder flexion/cane x 10;   pec stretch 30 sec x 3 bil;  Quadruped L/R weight shifts x 10; SA presses x 20;  S/L:   ER 2 lb 2x10 on R;  Standing: AROM flexion/full ROM x 15;  AROM/Abd x 15;  IR/ER Gtb x 15 ea on R;  Seated:  Stretches: doorway (light) x 2 min;  Manual: PROM for R shoulder all motions to tolerance. Marland Kitchen  PATIENT EDUCATION:  Education details: reviewed and updated HEP, Person educated: Patient Education method: Explanation, Demonstration, Tactile cues, Verbal cues, and Handouts Education comprehension: verbalized understanding, returned demonstration, verbal cues required,  tactile cues required, and needs further education   HOME EXERCISE PROGRAM: Access Code: [email or web address]   - hip Access Code: [email or web address] - shoulder     ASSESSMENT:  CLINICAL IMPRESSION: 05/23/2022  Pt with improving ability for activity, and improving ROM. She has mild limitation for full/end range flex and ER with soreness. Plan to progress as tolerated.    OBJECTIVE IMPAIRMENTS decreased activity tolerance, decreased knowledge of use of DME, decreased mobility, decreased ROM, decreased strength, increased muscle spasms, improper body mechanics, and pain.   ACTIVITY LIMITATIONS carrying, lifting, bending, standing, squatting, stairs, dressing, reach over head, and locomotion level  PARTICIPATION LIMITATIONS: meal prep, cleaning, laundry, driving, shopping, community activity, and yard work  PERSONAL FACTORS Time since onset of injury/illness/exacerbation are also affecting patient's functional outcome.   REHAB POTENTIAL: Good  CLINICAL DECISION MAKING: Stable/uncomplicated  EVALUATION COMPLEXITY: Low   GOALS: Goals reviewed with patient? Yes  SHORT TERM GOALS: Target date:  03/01/22  Pt to be independent with initial HEP  Goal status: MET   2.  Pt to report decrease in pain in glute muscles and soft tissue by at least 50%, for decreased pain.   Goal status: MET    LONG TERM GOALS: Target date: 06/20/22  Pt to be independent with final HEP  Goal status: IN PROGRESS  2.  Pt to report decreased pain in R hip and glute to 0-2/10 with activity.  Goal status: IN PROGRESS  3.  Pt to demo improved strength of bil hips to at least 4+/5 to improve stability and pain.   Goal status: IN PROGRESS  4.  Pt to demo ability for standing/walking activity for at least 30 min without increased pain, to improve ability for community activity.    Goal status: IN PROGRESS  5. PT to demo ability for full shoulder AROM without pain, to improve ability for ADLS.   Goal status: in  progress  6. Pt to demo improved strength of R shoulder to at least 4+/5 to improve ability for IADLS.   Goal status: in progress     PLAN: PT FREQUENCY: 1-2x/week  PT DURATION: 6 weeks  PLANNED INTERVENTIONS: Therapeutic exercises, Therapeutic activity, Neuromuscular re-education, Gait training, Patient/Family education, Joint manipulation, Joint mobilization, Stair training, DME instructions, Dry Needling, Electrical stimulation, Spinal manipulation, Spinal mobilization, Cryotherapy, Moist heat, Taping, Traction, Ultrasound, Ionotophoresis 45m/ml Dexamethasone, and Manual therapy  PLAN FOR NEXT SESSION:   LLyndee Hensen PT, DPT 10:08 AM  05/24/22

## 2022-05-30 ENCOUNTER — Ambulatory Visit (INDEPENDENT_AMBULATORY_CARE_PROVIDER_SITE_OTHER): Payer: 59 | Admitting: Orthopaedic Surgery

## 2022-05-30 DIAGNOSIS — M19011 Primary osteoarthritis, right shoulder: Secondary | ICD-10-CM | POA: Diagnosis not present

## 2022-05-30 DIAGNOSIS — M19019 Primary osteoarthritis, unspecified shoulder: Secondary | ICD-10-CM

## 2022-05-30 MED ORDER — LIDOCAINE HCL 1 % IJ SOLN
4.0000 mL | INTRAMUSCULAR | Status: AC | PRN
  Administered 2022-05-30: 4 mL

## 2022-05-30 MED ORDER — TRIAMCINOLONE ACETONIDE 40 MG/ML IJ SUSP
80.0000 mg | INTRAMUSCULAR | Status: AC | PRN
  Administered 2022-05-30: 80 mg via INTRA_ARTICULAR

## 2022-05-30 NOTE — Progress Notes (Signed)
Post Operative Evaluation    Procedure/Date of Surgery: Right shoulder acromioplasty with distal clavicle resection 03/20/22  Interval History:   Presents today for follow-up of her right shoulder.  She still is experiencing some stiffness with behind the back type activity like putting on the bra.  There are certain motions where she will reach for and feel pain predominantly in the glenohumeral joint.  PMH/PSH/Family History/Social History/Meds/Allergies:    Past Medical History:  Diagnosis Date  . Arthritis   . Chicken pox   . GERD (gastroesophageal reflux disease)    OTC  . Leaking of urine   . Low iron   . Mumps    Past Surgical History:  Procedure Laterality Date  . CESAREAN SECTION     4 cs  . LAPAROSCOPY  03/04/2012   Procedure: LAPAROSCOPY OPERATIVE;  Surgeon: Eldred Manges, MD;  Location: [facility];  Service: Gynecology;  Laterality: N/A;  Peritoneum Biopsy  . SHOULDER ARTHROSCOPY Right 03/20/2022   Procedure: RIGHT SHOULDER ARTHROSCOPY WITH DISTAL CLAVICLE RESECTION;  Surgeon: Vanetta Mulders, MD;  Location: [place];  Service: Orthopedics;  Laterality: Right;  . TUBAL LIGATION Bilateral    Social History   Socioeconomic History  . Marital status: Married    Spouse name: Not on file  . Number of children: Not on file  . Years of education: Not on file  . Highest education level: Not on file  Occupational History  . Occupation: TEACHER     Employer: [employer]  Tobacco Use  . Smoking status: Never  . Smokeless tobacco: Never  Substance and Sexual Activity  . Alcohol use: Yes    Alcohol/week: 4.0 standard drinks of alcohol    Types: 2 Glasses of wine, 2 Cans of beer per week    Comment: wine nightly  . Drug use: No  . Sexual activity: Yes    Birth control/protection: Surgical, Post-menopausal    Comment: BTL  Other Topics Concern  . 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 Heart disease Father   . Hypertension Father   . Cancer Father   . Stroke Father   . Heart disease Mother   . Hypertension Mother   . Arthritis Mother   . Breast cancer Mother   . Osteoporosis Mother   . Arthritis Sister   . Asthma Sister    Allergies  Allergen Reactions  . Morphine Nausea Only   Current Outpatient Medications  Medication Sig Dispense Refill  . aspirin EC 325 MG tablet TAKE 1 TABLET BY MOUTH EVERY DAY 30 tablet 0  . conjugated estrogens (PREMARIN) vaginal cream Premarin 0.625 mg/gram vaginal cream  Insert 0.5 applicatorsful twice a week by vaginal route. 42.5 g 2  . diclofenac Sodium (VOLTAREN) 1 % GEL Apply 2 g topically as needed.    . fluticasone (FLONASE) 50 MCG/ACT nasal spray Place 1 spray into both nostrils as needed for allergies or rhinitis.    Marland Kitchen  ibuprofen (ADVIL) 200 MG tablet Take 800 mg by mouth every 6 (six) hours as needed.    . triamcinolone cream (KENALOG) 0.1 % Apply to affected area 1-2 times daily 30 g 0   No current facility-administered medications for this visit.   No results found.  Review of Systems:   A ROS was performed including pertinent positives and negatives as documented in the HPI.   Musculoskeletal Exam:    There were no vitals taken for this visit.  Right portals are well-appearing without erythema or drainage.  Active forward elevation is to approximately 170 compared to 175 on the contralateral side.  External rotation at the side is to 60 compared to 70 on the contralateral side.  Internal rotation is to L1 bilaterally.  Distal neurosensory exam is intact with 2+ radial pulse.  Imaging:    None  I personally  reviewed and interpreted the radiographs.   Assessment:   10 weeks status post right distal clavicle resection.  At this time she does have some stiffness at terminal motion.  I do believe that this is some capsulitis which she is experiencing postoperative.  At this time I have recommended ultrasound-guided glenohumeral injection to help with this.  She would like to proceed with this.  I will plan to see her back in 2 months for reassessment and likely final check  Plan :    -Right glenohumeral ultrasound-guided injection performed after verbal consent obtained    Procedure Note  Patient: Samantha Becker             Date of Birth: 10/15/1955           MRN: [id number]             Visit Date: 05/30/2022  Procedures: Visit Diagnoses: No diagnosis found.  Large Joint Inj: R glenohumeral on 05/30/2022 11:10 AM Indications: pain Details: 22 G 1.5 in needle, ultrasound-guided anterior approach  Arthrogram: No  Medications: 4 mL lidocaine 1 %; 80 mg triamcinolone acetonide 40 MG/ML Outcome: tolerated well, no immediate complications Procedure, treatment alternatives, risks and benefits explained, specific risks discussed. Consent was given by the patient. Immediately prior to procedure a time out was called to verify the correct patient, procedure, equipment, support staff and site/side marked as required. Patient was prepped and draped in the usual sterile fashion.           I personally saw and evaluated the patient, and participated in the management and treatment plan.  Vanetta Mulders, MD Attending Physician, Orthopedic Surgery  This document was dictated using Dragon voice recognition software. A reasonable attempt at proof reading has been made to minimize errors.

## 2022-05-31 ENCOUNTER — Encounter: Payer: 59 | Admitting: Physical Therapy

## 2022-06-07 ENCOUNTER — Encounter (HOSPITAL_BASED_OUTPATIENT_CLINIC_OR_DEPARTMENT_OTHER): Payer: Self-pay

## 2022-06-07 ENCOUNTER — Ambulatory Visit (INDEPENDENT_AMBULATORY_CARE_PROVIDER_SITE_OTHER): Payer: 59 | Admitting: Physical Therapy

## 2022-06-07 ENCOUNTER — Encounter: Payer: Self-pay | Admitting: Physical Therapy

## 2022-06-07 ENCOUNTER — Ambulatory Visit (INDEPENDENT_AMBULATORY_CARE_PROVIDER_SITE_OTHER): Payer: 59

## 2022-06-07 DIAGNOSIS — M25551 Pain in right hip: Secondary | ICD-10-CM

## 2022-06-07 DIAGNOSIS — Z23 Encounter for immunization: Secondary | ICD-10-CM

## 2022-06-07 DIAGNOSIS — M25511 Pain in right shoulder: Secondary | ICD-10-CM | POA: Diagnosis not present

## 2022-06-07 NOTE — Progress Notes (Signed)
Pt received flu shot, injection was administered into rt deltoid, pt tolerated well.

## 2022-06-07 NOTE — Therapy (Signed)
OUTPATIENT PHYSICAL THERAPY TREATMENT   Patient Name: Samantha Becker MRN: [id number] DOB:07-10-1956, 66 y.o., female Today's Date: 06/07/2022   PT End of Session - 06/07/22 0822     Visit Number 18    Number of Visits 20    Date for PT Re-Evaluation 06/20/22    Authorization Type UHC,  Re-eval  done at visit 6, visit 15.    PT Start Time 0804    PT Stop Time 0844    PT Time Calculation (min) 40 min    Activity Tolerance Patient tolerated treatment well    Behavior During Therapy WFL for tasks assessed/performed                        Past Medical History:  Diagnosis Date   Arthritis    Chicken pox    GERD (gastroesophageal reflux disease)    OTC   Leaking of urine    Low iron    Mumps    Past Surgical History:  Procedure Laterality Date   CESAREAN SECTION     4 cs   LAPAROSCOPY  03/04/2012   Procedure: LAPAROSCOPY OPERATIVE;  Surgeon: Eldred Manges, MD;  Location: [facility];  Service: Gynecology;  Laterality: N/A;  Peritoneum Biopsy   SHOULDER ARTHROSCOPY Right 03/20/2022   Procedure: RIGHT SHOULDER ARTHROSCOPY WITH DISTAL CLAVICLE RESECTION;  Surgeon: Vanetta Mulders, MD;  Location: [place];  Service: Orthopedics;  Laterality: Right;   TUBAL LIGATION Bilateral    Patient Active Problem List   Diagnosis Date Noted   Osteoarthritis of AC (acromioclavicular) joint    Endolymphatic hydrops of right ear 08/18/2020   Right-sided sensorineural hearing loss 08/18/2020   Atrophic vaginitis 03/20/2017   Endometriosis 03/18/2012   Dyspareunia 02/01/2012    PCP: Inda Coke  REFERRING PROVIDER: Ellard Artis corey/ Vanetta Mulders  REFERRING DIAG: R hip pain/ R shoulder pain/ S/P DCE  THERAPY DIAG:  Pain in right hip  Acute pain of right shoulder  Rationale for Evaluation and Treatment Rehabilitation  ONSET DATE:   SUBJECTIVE:   SUBJECTIVE STATEMENT: 06/07/2022 Pt had injection last week. States much less soreness at rest.    PERTINENT HISTORY:   PAIN:  Are you having pain? Yes: NPRS scale: 0-3/10 Pain location: R hip  Pain description: Sore Aggravating factors: late at night, and early AM.  Relieving factors: movement, walking.   Are you having pain? Yes: NPRS scale: 0-3/10 Pain location: R shoulder  Pain description: Sore Aggravating factors:   movement  Relieving factors: ice  PRECAUTIONS: None  WEIGHT BEARING RESTRICTIONS No  FALLS:  Has patient fallen in last 6 months? No  PLOF: Independent  PATIENT GOALS   decreased pain in R hip. And R shoulder, return to regular use of R arm.     OBJECTIVE: updated 9/13  DIAGNOSTIC FINDINGS: no imaging.    COGNITION:  Overall cognitive status: Within functional limits for tasks assessed      POSTURE: No Significant postural limitations  PALPATION: Tenderness in Gr troch, into R glute med, min, piriformis.    LOWER EXTREMITY ROM  Lumbar: WFL,   Hips: WFL   Shoulder PROM: flex: 155 deg, abd:150 deg,  IR:wfl , ER: 60 deg   Shoulder AROM: mild limitation for end range flex and ER   LOWER EXTREMITY MMT:  Hips: flex: 4+/5, abd: 4+/5  Shoulder: flex/abd 4/5,  IR/ER 4+/5 in mid range.   LOWER EXTREMITY SPECIAL TESTS:  Johnnette Barrios, [place],  TODAY'S TREATMENT:  06/07/22: Ther ex:  Aerobic: UBE bwd x 4 min Supine    Shoulder flexion/cane x 10;  ER AAROM at 45 at 90 deg x 10 ea;  pec stretch 30 sec x 3 on R;  S/L:   ER 1 lb 2x10 on R;  Standing: AROM flexion/full ROM x 15;    ER Gtb x 15 ea on R;  Rows GTB x 15;  Seated:  Stretches: Flexion at wall x 10;  Manual: PROM for R shoulder all motions to tolerance. GHJ post and inf.     05/23/2022 Ther ex:  Aerobic: UBE bwd x 4 min Supine   Horiz abd AROM x 15; Shoulder flexion/cane x 10;   pec stretch 30 sec x 3 bil;  Quadruped L/R weight shifts x 10; SA presses x 20;  S/L:   ER 2 lb 2x10 on R;  Standing: AROM flexion/full ROM x 15;  AROM/Abd x 15;  IR/ER Gtb x 15 ea on R;  Seated:   Stretches: doorway (light) x 2 min;  Manual: PROM for R shoulder all motions to tolerance. Marland Kitchen  PATIENT EDUCATION:  Education details: reviewed and updated HEP, Person educated: Patient Education method: Explanation, Demonstration, Tactile cues, Verbal cues, and Handouts Education comprehension: verbalized understanding, returned demonstration, verbal cues required, tactile cues required, and needs further education   HOME EXERCISE PROGRAM: Access Code: [email or web address]   - hip Access Code: [email or web address] - shoulder     ASSESSMENT:  CLINICAL IMPRESSION: 06/07/2022 Pt having less pain at rest and with regular daily activities, since getting injection. She does still have pain at and end range for flexion and ER, with mild ROM deficit, but doing very well functionally. She will benefit from continued care, at 1x/wk, with focus on restoring end range of motion. Pain has been limiting factor with this in past weeks.  OBJECTIVE IMPAIRMENTS decreased activity tolerance, decreased knowledge of use of DME, decreased mobility, decreased ROM, decreased strength, increased muscle spasms, improper body mechanics, and pain.   ACTIVITY LIMITATIONS carrying, lifting, bending, standing, squatting, stairs, dressing, reach over head, and locomotion level  PARTICIPATION LIMITATIONS: meal prep, cleaning, laundry, driving, shopping, community activity, and yard work  PERSONAL FACTORS Time since onset of injury/illness/exacerbation are also affecting patient's functional outcome.   REHAB POTENTIAL: Good  CLINICAL DECISION MAKING: Stable/uncomplicated  EVALUATION COMPLEXITY: Low   GOALS: Goals reviewed with patient? Yes  SHORT TERM GOALS: Target date:  03/01/22  Pt to be independent with initial HEP  Goal status: MET   2.  Pt to report decrease in pain in glute muscles and soft tissue by at least 50%, for decreased pain.   Goal status: MET    LONG TERM GOALS: Target date: 06/20/22  Pt to be independent  with final HEP  Goal status: IN PROGRESS  2.  Pt to report decreased pain in R hip and glute to 0-2/10 with activity.  Goal status: IN PROGRESS  3.  Pt to demo improved strength of bil hips to at least 4+/5 to improve stability and pain.   Goal status: IN PROGRESS  4.  Pt to demo ability for standing/walking activity for at least 30 min without increased pain, to improve ability for community activity.    Goal status: IN PROGRESS  5. PT to demo ability for full shoulder AROM without pain, to improve ability for ADLS.   Goal status: in progress  6. Pt to demo improved strength of R shoulder to at least 4+/5 to  improve ability for IADLS.   Goal status: in progress     PLAN: PT FREQUENCY: 1-2x/week  PT DURATION: 6 weeks  PLANNED INTERVENTIONS: Therapeutic exercises, Therapeutic activity, Neuromuscular re-education, Gait training, Patient/Family education, Joint manipulation, Joint mobilization, Stair training, DME instructions, Dry Needling, Electrical stimulation, Spinal manipulation, Spinal mobilization, Cryotherapy, Moist heat, Taping, Traction, Ultrasound, Ionotophoresis 43m/ml Dexamethasone, and Manual therapy  PLAN FOR NEXT SESSION:   LLyndee Hensen PT, DPT 8:45 AM  06/07/22

## 2022-06-08 ENCOUNTER — Encounter (HOSPITAL_BASED_OUTPATIENT_CLINIC_OR_DEPARTMENT_OTHER): Payer: 59 | Admitting: Orthopaedic Surgery

## 2022-06-11 ENCOUNTER — Encounter: Payer: Self-pay | Admitting: Physical Therapy

## 2022-06-11 ENCOUNTER — Ambulatory Visit (INDEPENDENT_AMBULATORY_CARE_PROVIDER_SITE_OTHER): Payer: 59 | Admitting: Physical Therapy

## 2022-06-11 DIAGNOSIS — M25551 Pain in right hip: Secondary | ICD-10-CM | POA: Diagnosis not present

## 2022-06-11 DIAGNOSIS — M25511 Pain in right shoulder: Secondary | ICD-10-CM | POA: Diagnosis not present

## 2022-06-11 NOTE — Therapy (Signed)
OUTPATIENT PHYSICAL THERAPY TREATMENT   Patient Name: Samantha Becker MRN: [id number] DOB:09-06-1955, 66 y.o., female Today's Date: 06/11/2022   PT End of Session - 06/11/22 1647     Visit Number 19    Number of Visits 20    Date for PT Re-Evaluation 06/20/22    Authorization Type UHC,  Re-eval  done at visit 6, visit 15.    PT Start Time 1347    PT Stop Time 1425    PT Time Calculation (min) 38 min    Activity Tolerance Patient tolerated treatment well    Behavior During Therapy WFL for tasks assessed/performed                         Past Medical History:  Diagnosis Date   Arthritis    Chicken pox    GERD (gastroesophageal reflux disease)    OTC   Leaking of urine    Low iron    Mumps    Past Surgical History:  Procedure Laterality Date   CESAREAN SECTION     4 cs   LAPAROSCOPY  03/04/2012   Procedure: LAPAROSCOPY OPERATIVE;  Surgeon: Eldred Manges, MD;  Location: [facility];  Service: Gynecology;  Laterality: N/A;  Peritoneum Biopsy   SHOULDER ARTHROSCOPY Right 03/20/2022   Procedure: RIGHT SHOULDER ARTHROSCOPY WITH DISTAL CLAVICLE RESECTION;  Surgeon: Vanetta Mulders, MD;  Location: [place];  Service: Orthopedics;  Laterality: Right;   TUBAL LIGATION Bilateral    Patient Active Problem List   Diagnosis Date Noted   Osteoarthritis of AC (acromioclavicular) joint    Endolymphatic hydrops of right ear 08/18/2020   Right-sided sensorineural hearing loss 08/18/2020   Atrophic vaginitis 03/20/2017   Endometriosis 03/18/2012   Dyspareunia 02/01/2012    PCP: Inda Coke  REFERRING PROVIDER: Ellard Artis corey/ Vanetta Mulders  REFERRING DIAG: R hip pain/ R shoulder pain/ S/P DCE  THERAPY DIAG:  Pain in right hip  Acute pain of right shoulder  Rationale for Evaluation and Treatment Rehabilitation  ONSET DATE:   SUBJECTIVE:   SUBJECTIVE STATEMENT: 06/11/2022 Pt had injection last week. Was feeling less pain. Went to a  yoga type class, and has had pain returned to [facility] joint region.   PERTINENT HISTORY:   PAIN:  Are you having pain? Yes: NPRS scale: 0-3/10 Pain location: R hip  Pain description: Sore Aggravating factors: late at night, and early AM.  Relieving factors: movement, walking.   Are you having pain? Yes: NPRS scale: 0-3/10 Pain location: R shoulder  Pain description: Sore Aggravating factors:   movement  Relieving factors: ice  PRECAUTIONS: None  WEIGHT BEARING RESTRICTIONS No  FALLS:  Has patient fallen in last 6 months? No  PLOF: Independent  PATIENT GOALS   decreased pain in R hip. And R shoulder, return to regular use of R arm.     OBJECTIVE: updated 9/13  DIAGNOSTIC FINDINGS: no imaging.    COGNITION:  Overall cognitive status: Within functional limits for tasks assessed      POSTURE: No Significant postural limitations  PALPATION: Tenderness in Gr troch, into R glute med, min, piriformis.    LOWER EXTREMITY ROM  Lumbar: WFL,   Hips: WFL   Shoulder PROM: flex: 155 deg, abd:150 deg,  IR:wfl , ER: 60 deg   Shoulder AROM: mild limitation for end range flex and ER   LOWER EXTREMITY MMT:  Hips: flex: 4+/5, abd: 4+/5  Shoulder: flex/abd 4/5,  IR/ER 4+/5 in  mid range.   LOWER EXTREMITY SPECIAL TESTS:  Nadara Mustard,    TODAY'S TREATMENT:  06/11/22: Ther ex:  Aerobic: UBE bwd x 4 min Supine    Shoulder flexion/cane x 10;  ER AAROM at 45 at 90 deg x 10 ea;  pec stretch 30 sec x 3 on R;  S/L:   ER 1 lb x10 , 2lb x 10  on R; horiz abd x 15 ;  Standing: AROM flexion/full ROM x 15;  ER Gtb x 15 ea on R;    Seated:  Stretches: Flexion at wall x 10;  Manual: PROM for R shoulder all motions to tolerance. Manual UT and scalene stretches, light clavicle mobs;     05/23/2022 Ther ex:  Aerobic: UBE bwd x 4 min Supine   Horiz abd AROM x 15; Shoulder flexion/cane x 10;   pec stretch 30 sec x 3 bil;  Quadruped L/R weight shifts x 10; SA presses x 20;  S/L:    ER 2 lb 2x10 on R;  Standing: AROM flexion/full ROM x 15;  AROM/Abd x 15;  IR/ER Gtb x 15 ea on R;  Seated:  Stretches: doorway (light) x 2 min;  Manual: PROM for R shoulder all motions to tolerance. Marland Kitchen  PATIENT EDUCATION:  Education details: reviewed and updated HEP, Person educated: Patient Education method: Explanation, Demonstration, Tactile cues, Verbal cues, and Handouts Education comprehension: verbalized understanding, returned demonstration, verbal cues required, tactile cues required, and needs further education   HOME EXERCISE PROGRAM: Access Code: [email or web address]   - hip Access Code: [email or web address] - shoulder     ASSESSMENT:  CLINICAL IMPRESSION: 06/11/2022 Pt having low, but continued pain in superior shoulder. Discussed continuing activities as pain allows. Improved ability for strengthening with minimal pain. Does have soreness with end range of ER.   OBJECTIVE IMPAIRMENTS decreased activity tolerance, decreased knowledge of use of DME, decreased mobility, decreased ROM, decreased strength, increased muscle spasms, improper body mechanics, and pain.   ACTIVITY LIMITATIONS carrying, lifting, bending, standing, squatting, stairs, dressing, reach over head, and locomotion level  PARTICIPATION LIMITATIONS: meal prep, cleaning, laundry, driving, shopping, community activity, and yard work  PERSONAL FACTORS Time since onset of injury/illness/exacerbation are also affecting patient's functional outcome.   REHAB POTENTIAL: Good  CLINICAL DECISION MAKING: Stable/uncomplicated  EVALUATION COMPLEXITY: Low   GOALS: Goals reviewed with patient? Yes  SHORT TERM GOALS: Target date:  03/01/22  Pt to be independent with initial HEP  Goal status: MET   2.  Pt to report decrease in pain in glute muscles and soft tissue by at least 50%, for decreased pain.   Goal status: MET    LONG TERM GOALS: Target date: 06/20/22  Pt to be independent with final HEP  Goal status: IN  PROGRESS  2.  Pt to report decreased pain in R hip and glute to 0-2/10 with activity.  Goal status: IN PROGRESS  3.  Pt to demo improved strength of bil hips to at least 4+/5 to improve stability and pain.   Goal status: IN PROGRESS  4.  Pt to demo ability for standing/walking activity for at least 30 min without increased pain, to improve ability for community activity.    Goal status: IN PROGRESS  5. PT to demo ability for full shoulder AROM without pain, to improve ability for ADLS.   Goal status: in progress  6. Pt to demo improved strength of R shoulder to at least 4+/5 to improve ability for IADLS.   Goal  status: in progress     PLAN: PT FREQUENCY: 1-2x/week  PT DURATION: 6 weeks  PLANNED INTERVENTIONS: Therapeutic exercises, Therapeutic activity, Neuromuscular re-education, Gait training, Patient/Family education, Joint manipulation, Joint mobilization, Stair training, DME instructions, Dry Needling, Electrical stimulation, Spinal manipulation, Spinal mobilization, Cryotherapy, Moist heat, Taping, Traction, Ultrasound, Ionotophoresis 62m/ml Dexamethasone, and Manual therapy  PLAN FOR NEXT SESSION:   LLyndee Hensen PT, DPT 4:47 PM  06/11/22

## 2022-06-19 ENCOUNTER — Encounter: Payer: Self-pay | Admitting: Physical Therapy

## 2022-06-19 ENCOUNTER — Ambulatory Visit (INDEPENDENT_AMBULATORY_CARE_PROVIDER_SITE_OTHER): Payer: 59 | Admitting: Physical Therapy

## 2022-06-19 DIAGNOSIS — M25511 Pain in right shoulder: Secondary | ICD-10-CM

## 2022-06-19 DIAGNOSIS — M25551 Pain in right hip: Secondary | ICD-10-CM

## 2022-06-19 NOTE — Therapy (Signed)
OUTPATIENT PHYSICAL THERAPY TREATMENT   Patient Name: Samantha Becker MRN: [id number] DOB:12/15/1955, 66 y.o., female Today's Date: 06/19/2022   PT End of Session - 06/19/22 1219     Visit Number 20    Number of Visits 20    Date for PT Re-Evaluation 06/20/22    Authorization Type [employer],  Re-eval  done at visit 6, visit 15.    PT Start Time 1217    PT Stop Time 1255    PT Time Calculation (min) 38 min    Activity Tolerance Patient tolerated treatment well    Behavior During Therapy WFL for tasks assessed/performed                         Past Medical History:  Diagnosis Date   Arthritis    Chicken pox    GERD (gastroesophageal reflux disease)    OTC   Leaking of urine    Low iron    Mumps    Past Surgical History:  Procedure Laterality Date   CESAREAN SECTION     4 cs   LAPAROSCOPY  03/04/2012   Procedure: LAPAROSCOPY OPERATIVE;  Surgeon: Eldred Manges, MD;  Location: [facility];  Service: Gynecology;  Laterality: N/A;  Peritoneum Biopsy   SHOULDER ARTHROSCOPY Right 03/20/2022   Procedure: RIGHT SHOULDER ARTHROSCOPY WITH DISTAL CLAVICLE RESECTION;  Surgeon: Vanetta Mulders, MD;  Location: [facility];  Service: Orthopedics;  Laterality: Right;   TUBAL LIGATION Bilateral    Patient Active Problem List   Diagnosis Date Noted   Osteoarthritis of AC (acromioclavicular) joint    Endolymphatic hydrops of right ear 08/18/2020   Right-sided sensorineural hearing loss 08/18/2020   Atrophic vaginitis 03/20/2017   Endometriosis 03/18/2012   Dyspareunia 02/01/2012    PCP: Inda Coke  REFERRING PROVIDER: Ellard Artis corey/ Vanetta Mulders  REFERRING DIAG: R hip pain/ R shoulder pain/ S/P DCE  THERAPY DIAG:  Pain in right hip  Acute pain of right shoulder  Rationale for Evaluation and Treatment Rehabilitation  ONSET DATE:   SUBJECTIVE:   SUBJECTIVE STATEMENT: 06/19/2022 Pt states shoulder doing well, much less pain at rest.  Hip  painful today, has been variable.    PERTINENT HISTORY:   PAIN:  Are you having pain? Yes: NPRS scale: 0-3/10 Pain location: R hip  Pain description: Sore Aggravating factors: late at night, and early AM.  Relieving factors: movement, walking.   Are you having pain? Yes: NPRS scale: 0-3/10 Pain location: R shoulder  Pain description: Sore Aggravating factors:   movement  Relieving factors: ice  PRECAUTIONS: None  WEIGHT BEARING RESTRICTIONS No  FALLS:  Has patient fallen in last 6 months? No  PLOF: Independent  PATIENT GOALS   decreased pain in R hip. And R shoulder, return to regular use of R arm.     OBJECTIVE: updated 10/24  DIAGNOSTIC FINDINGS: no imaging.    COGNITION:  Overall cognitive status: Within functional limits for tasks assessed      POSTURE: No Significant postural limitations  PALPATION: Tenderness in Gr troch, into R glute med, min, piriformis.    LOWER EXTREMITY ROM  Lumbar: WFL,   Hips: WFL   Shoulder PROM: flex: 155 deg, abd:150 deg,  IR:wfl , ER: 60 deg   Shoulder AROM: mild limitation for end range flex and ER   LOWER EXTREMITY MMT:  Hips: flex: 4+/5, abd: 4+/5  Shoulder: flex/abd 4+/5,  IR/ER 4+/5 in mid range.   LOWER EXTREMITY  SPECIAL TESTS:  Nadara Mustard,    TODAY'S TREATMENT:  06/18/22: Ther ex:  Aerobic: UBE bwd x 4 min Supine    Shoulder flexion/cane x 10;  ER AAROM at 90 deg x 10 ea;   pec stretch 30 sec x 3 on R;  S/L:   ER 2lb x 15  Standing: AROM flexion, abd, ext /full ROM x 15ea; R behind back ROM x 10;  ER Gtb x 15 bil;  Stretches: Flexion at wall x 10;  Manual: PROM for all motions of R shoulder     PATIENT EDUCATION:  Education details: reviewed and updated HEP, Person educated: Patient Education method: Consulting civil engineer, Demonstration, Tactile cues, Verbal cues, and Handouts Education comprehension: verbalized understanding, returned demonstration, verbal cues required, tactile cues required, and  needs further education   HOME EXERCISE PROGRAM: Access Code: [email or web address]   - hip Access Code: [email or web address] - shoulder     ASSESSMENT:  CLINICAL IMPRESSION: 06/19/2022 Pt with less pain this week at rest. She is doing very well at this time, doing most regular activities at home. She has much improved strength and ROM, and has met goals at this time. Ready for d/c to HEP. Recommended f/u with R hip now that shoulder is doing better,  if it continues to be painful, as it has been hurting for a very long time.   OBJECTIVE IMPAIRMENTS decreased activity tolerance, decreased knowledge of use of DME, decreased mobility, decreased ROM, decreased strength, increased muscle spasms, improper body mechanics, and pain.   ACTIVITY LIMITATIONS carrying, lifting, bending, standing, squatting, stairs, dressing, reach over head, and locomotion level  PARTICIPATION LIMITATIONS: meal prep, cleaning, laundry, driving, shopping, community activity, and yard work  PERSONAL FACTORS Time since onset of injury/illness/exacerbation are also affecting patient's functional outcome.   REHAB POTENTIAL: Good  CLINICAL DECISION MAKING: Stable/uncomplicated  EVALUATION COMPLEXITY: Low   GOALS: Goals reviewed with patient? Yes  SHORT TERM GOALS: Target date:  03/01/22  Pt to be independent with initial HEP  Goal status: MET   2.  Pt to report decrease in pain in glute muscles and soft tissue by at least 50%, for decreased pain.   Goal status: MET    LONG TERM GOALS: Target date: 06/20/22  Pt to be independent with final HEP  Goal status: MET  2.  Pt to report decreased pain in R hip and glute to 0-2/10 with activity.  Goal status: IN PROGRESS  3.  Pt to demo improved strength of bil hips to at least 4+/5 to improve stability and pain.   Goal status: MET  4.  Pt to demo ability for standing/walking activity for at least 30 min without increased pain, to improve ability for community activity.    Goal  status: IN PROGRESS  5. PT to demo ability for full shoulder AROM without pain, to improve ability for ADLS.   Goal status: MET  6. Pt to demo improved strength of R shoulder to at least 4+/5 to improve ability for IADLS.   Goal status: MET    PLAN: PT FREQUENCY: 1-2x/week  PT DURATION: 6 weeks  PLANNED INTERVENTIONS: Therapeutic exercises, Therapeutic activity, Neuromuscular re-education, Gait training, Patient/Family education, Joint manipulation, Joint mobilization, Stair training, DME instructions, Dry Needling, Electrical stimulation, Spinal manipulation, Spinal mobilization, Cryotherapy, Moist heat, Taping, Traction, Ultrasound, Ionotophoresis 52m/ml Dexamethasone, and Manual therapy  PLAN FOR NEXT SESSION:   LLyndee Hensen PT, DPT 12:55 PM  06/19/22  PHYSICAL THERAPY DISCHARGE SUMMARY  Visits from Start of Care:  20   Plan: Patient agrees to discharge.  Patient goals were met. Patient is being discharged due to meeting the stated rehab goals.     Lyndee Hensen, PT, DPT 12:58 PM  06/19/22

## 2022-06-26 ENCOUNTER — Encounter: Payer: 59 | Admitting: Physical Therapy

## 2022-07-11 ENCOUNTER — Ambulatory Visit (INDEPENDENT_AMBULATORY_CARE_PROVIDER_SITE_OTHER): Payer: 59 | Admitting: Orthopaedic Surgery

## 2022-07-11 DIAGNOSIS — M19019 Primary osteoarthritis, unspecified shoulder: Secondary | ICD-10-CM | POA: Diagnosis not present

## 2022-07-11 NOTE — Progress Notes (Signed)
Post Operative Evaluation    Procedure/Date of Surgery: Right shoulder acromioplasty with distal clavicle resection 03/20/22  Interval History:   Presents today for follow-up of her right shoulder.  Overall she feels back to nearly pain-free at today's visit.  She got significant relief from her last injection.  Overall doing very well today  PMH/PSH/Family History/Social History/Meds/Allergies:    Past Medical History:  Diagnosis Date   Arthritis    Chicken pox    GERD (gastroesophageal reflux disease)    OTC   Leaking of urine    Low iron    Mumps    Past Surgical History:  Procedure Laterality Date   CESAREAN SECTION     4 cs   LAPAROSCOPY  03/04/2012   Procedure: LAPAROSCOPY OPERATIVE;  Surgeon: Eldred Manges, MD;  Location: [facility];  Service: Gynecology;  Laterality: N/A;  Peritoneum Biopsy   SHOULDER ARTHROSCOPY Right 03/20/2022   Procedure: RIGHT SHOULDER ARTHROSCOPY WITH DISTAL CLAVICLE RESECTION;  Surgeon: Vanetta Mulders, MD;  Location: [facility];  Service: Orthopedics;  Laterality: Right;   TUBAL LIGATION Bilateral    Social History   Socioeconomic History   Marital status: Married    Spouse name: Not on file   Number of children: Not on file   Years of education: Not on file   Highest education level: Not on file  Occupational History   Occupation: TEACHER     Employer: [employer]  Tobacco Use   Smoking status: Never   Smokeless tobacco: Never  Substance and Sexual Activity   Alcohol use: Yes    Alcohol/week: 4.0 standard drinks of alcohol    Types: 2 Glasses of wine, 2 Cans of beer per week    Comment: wine nightly   Drug use: No   Sexual activity: Yes    Birth control/protection: Surgical, Post-menopausal    Comment: BTL  Other Topics Concern   Not on file  Social History Narrative   Prior Pharmacist, hospital at [employer], 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   Social Determinants of Health   Financial Resource  Strain: Not on file  Food Insecurity: Not on file  Transportation Needs: Not on file  Physical Activity: Not on file  Stress: Not on file  Social Connections: Not on file   Family History  Problem Relation Age of Onset   Heart disease Father    Hypertension Father    Cancer Father    Stroke Father    Heart disease Mother    Hypertension Mother    Arthritis Mother    Breast cancer Mother    Osteoporosis Mother    Arthritis Sister    Asthma Sister    Allergies  Allergen Reactions   Morphine Nausea Only   Current Outpatient Medications  Medication Sig Dispense Refill   aspirin EC 325 MG tablet TAKE 1 TABLET BY MOUTH EVERY DAY 30 tablet 0   conjugated estrogens (PREMARIN) vaginal cream Premarin 0.625 mg/gram vaginal cream  Insert 0.5 applicatorsful twice a week by vaginal route. 42.5 g 2   diclofenac Sodium (VOLTAREN) 1 % GEL Apply 2 g topically as needed.     fluticasone (FLONASE) 50 MCG/ACT nasal spray Place 1 spray into both nostrils as needed for allergies or rhinitis.     ibuprofen (ADVIL) 200 MG tablet Take 800 mg  by mouth every 6 (six) hours as needed.     triamcinolone cream (KENALOG) 0.1 % Apply to affected area 1-2 times daily 30 g 0   No current facility-administered medications for this visit.   No results found.  Review of Systems:   A ROS was performed including pertinent positives and negatives as documented in the HPI.   Musculoskeletal Exam:    There were no vitals taken for this visit.  Right portals are well-healed e.  Active forward elevation is to approximately 175 compared to 175 on the contralateral side.  External rotation at the side is to 70 compared to 70 on the contralateral side.  Internal rotation is to T12 bilaterally.  Strength is equal to contralateral side on the right distal neurosensory exam is intact with 2+ radial pulse.  Imaging:    None  I personally reviewed and interpreted the radiographs.   Assessment:   Status post right  shoulder distal clavicle resection overall doing extremely well.  At this time she has no pain about the [facility] joint.  Range of motion is now normalized equal to the contralateral side.  She will follow-up as needed  Plan :    -Return to clinic as needed      I personally saw and evaluated the patient, and participated in the management and treatment plan.  Vanetta Mulders, MD Attending Physician, Orthopedic Surgery  This document was dictated using Dragon voice recognition software. A reasonable attempt at proof reading has been made to minimize errors.

## 2022-08-01 ENCOUNTER — Ambulatory Visit (HOSPITAL_BASED_OUTPATIENT_CLINIC_OR_DEPARTMENT_OTHER): Payer: 59 | Admitting: Orthopaedic Surgery

## 2022-08-16 ENCOUNTER — Ambulatory Visit (HOSPITAL_BASED_OUTPATIENT_CLINIC_OR_DEPARTMENT_OTHER): Payer: 59 | Admitting: Orthopaedic Surgery

## 2022-08-24 ENCOUNTER — Ambulatory Visit (INDEPENDENT_AMBULATORY_CARE_PROVIDER_SITE_OTHER): Payer: 59 | Admitting: Nurse Practitioner

## 2022-08-24 ENCOUNTER — Encounter: Payer: Self-pay | Admitting: Nurse Practitioner

## 2022-08-24 VITALS — BP 126/76 | HR 68 | Temp 97.0°F | Ht 62.0 in | Wt 133.6 lb

## 2022-08-24 DIAGNOSIS — H6690 Otitis media, unspecified, unspecified ear: Secondary | ICD-10-CM

## 2022-08-24 DIAGNOSIS — J40 Bronchitis, not specified as acute or chronic: Secondary | ICD-10-CM

## 2022-08-24 MED ORDER — BENZONATATE 100 MG PO CAPS: 100.0000 mg | ORAL_CAPSULE | Freq: Three times a day (TID) | ORAL | 0 refills | Status: DC | PRN

## 2022-08-24 MED ORDER — AMOXICILLIN-POT CLAVULANATE 875-125 MG PO TABS: 1.0000 | ORAL_TABLET | Freq: Two times a day (BID) | ORAL | 0 refills | Status: DC

## 2022-08-24 NOTE — Patient Instructions (Signed)
It was great to see you!  Start augmentin twice a day with food for 10 days.  Keep alternating tessalon and mucinex.   Drink plenty of fluids.   Let's follow-up if your symptoms worsen or don't improve.   Take care,  Vance Peper, NP

## 2022-08-24 NOTE — Progress Notes (Signed)
Acute Office Visit  Subjective:     Patient ID: Samantha Becker, female    DOB: [birth year], 66 y.o.   MRN: [id number]  Chief Complaint  Patient presents with   Acute Visit    C/o productive cough x 10 days      HPI Patient is in today for cough for the last 10 days.   UPPER RESPIRATORY TRACT INFECTION  Fever: no Cough: yes - productive at times, thick/yellow Shortness of breath: yes - at times Wheezing: no Chest pain: no Chest tightness: yes Chest congestion: yes Nasal congestion: no Runny nose: no Post nasal drip: no Sneezing: no Sore throat: no Swollen glands: no Sinus pressure: no Headache: yes Face pain: no Toothache: no Ear pain: yes "right Ear pressure: no bilateral Eyes red/itching:no Eye drainage/crusting: no  Vomiting: no Rash: no Fatigue: yes Sick contacts: no Strep contacts: no  Context: worse Recurrent sinusitis: no Relief with OTC cold/cough medications:  a little   Treatments attempted: mucinex, tessalon    ROS See pertinent positives and negatives per HPI.     Objective:    BP 126/76 (BP Location: Right Arm, Patient Position: Sitting, Cuff Size: Small)   Pulse 68   Temp (!) 97 F (36.1 C) (Temporal)   Ht '5\' 2"'$  (1.575 m)   Wt 133 lb 9.6 oz (60.6 kg)   LMP  (LMP Unknown)   SpO2 97%   BMI 24.44 kg/m    Physical Exam Vitals and nursing note reviewed.  Constitutional:      General: She is not in acute distress.    Appearance: Normal appearance.  HENT:     Head: Normocephalic.     Right Ear: Ear canal and external ear normal. Tympanic membrane is injected.     Left Ear: Tympanic membrane, ear canal and external ear normal.  Eyes:     Conjunctiva/sclera: Conjunctivae normal.  Cardiovascular:     Rate and Rhythm: Normal rate and regular rhythm.     Pulses: Normal pulses.     Heart sounds: Normal heart sounds.  Pulmonary:     Effort: Pulmonary effort is normal.     Breath sounds: Normal breath sounds.   Musculoskeletal:     Cervical back: Normal range of motion.  Skin:    General: Skin is warm.  Neurological:     General: No focal deficit present.     Mental Status: She is alert and oriented to person, place, and time.  Psychiatric:        Mood and Affect: Mood normal.        Behavior: Behavior normal.        Thought Content: Thought content normal.        Judgment: Judgment normal.      Assessment & Plan:   Problem List Items Addressed This Visit   None Visit Diagnoses     Bronchitis    -  Primary   Most likely viral, will treat with tessalon and mucinex. She declines prednisone today. Encourage fluids, rest. F/u if not improving.   Acute otitis media, unspecified otitis media type       Will treat with augmentin BID x10 days. She can take tylenol or ibuprofen to help with pain. F/U if not improving   Relevant Medications   amoxicillin-clavulanate (AUGMENTIN) 875-125 MG tablet       Meds ordered this encounter  Medications   amoxicillin-clavulanate (AUGMENTIN) 875-125 MG tablet    Sig: Take 1 tablet by mouth 2 (two)  times daily.    Dispense:  20 tablet    Refill:  0   benzonatate (TESSALON) 100 MG capsule    Sig: Take 1 capsule (100 mg total) by mouth 3 (three) times daily as needed for cough.    Dispense:  30 capsule    Refill:  0    Return if symptoms worsen or fail to improve.  Charyl Dancer, NP

## 2022-08-29 ENCOUNTER — Ambulatory Visit (INDEPENDENT_AMBULATORY_CARE_PROVIDER_SITE_OTHER): Payer: 59 | Admitting: Orthopaedic Surgery

## 2022-08-29 ENCOUNTER — Ambulatory Visit (INDEPENDENT_AMBULATORY_CARE_PROVIDER_SITE_OTHER): Payer: 59

## 2022-08-29 DIAGNOSIS — M25552 Pain in left hip: Secondary | ICD-10-CM

## 2022-08-29 DIAGNOSIS — M67959 Unspecified disorder of synovium and tendon, unspecified thigh: Secondary | ICD-10-CM

## 2022-08-29 DIAGNOSIS — M25551 Pain in right hip: Secondary | ICD-10-CM

## 2022-08-29 NOTE — Progress Notes (Signed)
Chief Complaint: Bilateral hip pain     History of Present Illness:    Samantha Becker is a 67 y.o. female presents today with right worse than left hip pain which has been going on now for approximately a year.  She has previously undergone physical therapy for strengthening of both hips as well as balance although this only provided a limited period of relief.  At this time she is having a difficult time doing basic activities like walking or yoga.  She states that she is having a hard time laying directly on the right side and this wakes her up at night.  There is no radiation.  There is weakness.    Surgical History:   none  PMH/PSH/Family History/Social History/Meds/Allergies:    Past Medical History:  Diagnosis Date   Arthritis    Chicken pox    GERD (gastroesophageal reflux disease)    OTC   Leaking of urine    Low iron    Mumps    Past Surgical History:  Procedure Laterality Date   CESAREAN SECTION     4 cs   LAPAROSCOPY  03/04/2012   Procedure: LAPAROSCOPY OPERATIVE;  Surgeon: Eldred Manges, MD;  Location: [facility];  Service: Gynecology;  Laterality: N/A;  Peritoneum Biopsy   SHOULDER ARTHROSCOPY Right 03/20/2022   Procedure: RIGHT SHOULDER ARTHROSCOPY WITH DISTAL CLAVICLE RESECTION;  Surgeon: Vanetta Mulders, MD;  Location: [facility];  Service: Orthopedics;  Laterality: Right;   TUBAL LIGATION Bilateral    Social History   Socioeconomic History   Marital status: Married    Spouse name: Not on file   Number of children: Not on file   Years of education: Not on file   Highest education level: Not on file  Occupational History   Occupation: TEACHER     Employer: [employer]  Tobacco Use   Smoking status: Never   Smokeless tobacco: Never  Substance and Sexual Activity   Alcohol use: Yes    Alcohol/week: 4.0 standard drinks of alcohol    Types: 2 Glasses of wine, 2 Cans of beer per week    Comment:  wine nightly   Drug use: No   Sexual activity: Yes    Birth control/protection: Surgical, Post-menopausal    Comment: BTL  Other Topics Concern   Not on file  Social History Narrative   Prior Pharmacist, hospital at [employer], 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   Social Determinants of Health   Financial Resource Strain: Not on file  Food Insecurity: Not on file  Transportation Needs: Not on file  Physical Activity: Not on file  Stress: Not on file  Social Connections: Not on file   Family History  Problem Relation Age of Onset   Heart disease Father    Hypertension Father    Cancer Father    Stroke Father    Heart disease Mother    Hypertension Mother    Arthritis Mother    Breast cancer Mother    Osteoporosis Mother    Arthritis Sister    Asthma Sister    Allergies  Allergen Reactions   Morphine Nausea Only   Current Outpatient Medications  Medication Sig Dispense Refill   amoxicillin-clavulanate (AUGMENTIN) 875-125 MG tablet Take 1 tablet by mouth 2 (two) times daily. 20 tablet 0  aspirin EC 325 MG tablet TAKE 1 TABLET BY MOUTH EVERY DAY (Patient not taking: Reported on 08/24/2022) 30 tablet 0   benzonatate (TESSALON) 100 MG capsule Take 1 capsule (100 mg total) by mouth 3 (three) times daily as needed for cough. 30 capsule 0   conjugated estrogens (PREMARIN) vaginal cream Premarin 0.625 mg/gram vaginal cream  Insert 0.5 applicatorsful twice a week by vaginal route. 42.5 g 2   diclofenac Sodium (VOLTAREN) 1 % GEL Apply 2 g topically as needed.     fluticasone (FLONASE) 50 MCG/ACT nasal spray Place 1 spray into both nostrils as needed for allergies or rhinitis.     ibuprofen (ADVIL) 200 MG tablet Take 800 mg by mouth every 6 (six) hours as needed.     triamcinolone cream (KENALOG) 0.1 % Apply to affected area 1-2 times daily 30 g 0   No current facility-administered medications for this visit.   No results found.  Review of Systems:   A ROS was performed including pertinent positives and  negatives as documented in the HPI.  Physical Exam :   Constitutional: NAD and appears stated age Neurological: Alert and oriented Psych: Appropriate affect and cooperative There were no vitals taken for this visit.   Comprehensive Musculoskeletal Exam:    Inspection Right Left  Skin No atrophy or gross abnormalities appreciated No atrophy or gross abnormalities appreciated  Palpation    Tenderness Greater trochanter Greater trochanter  Crepitus None None  Range of Motion    Flexion (passive) 120 120  Extension 30 30  IR 30 30  ER 45 45  Strength    Flexion  5/5 5/5  Extension 5/5 5/5  Special Tests    FABER Negative Negative  FADIR Negative Negative  ER Lag/Capsular Insufficiency Negative Negative  Instability Negative Negative  Sacroiliac pain Negative  Negative   Instability    Generalized Laxity No No  Neurologic    sciatic, femoral, obturator nerves intact to light sensation  Vascular/Lymphatic    DP pulse 2+ 2+  Lumbar Exam    Patient has symmetric lumbar range of motion with negative pain referral to hip     Imaging:   Xray (ap pelvis, right hip 3 views, left hip 3 views, lumbar spine 4 views): Normal hips bilaterally, degenerative disc disease involving the lumbar spine  I personally reviewed and interpreted the radiographs.   Assessment:   67 y.o. female with right worse than left hip pain consistent with gluteus medius tears.  At this time she has trialed physical therapy for multiple months.  She is not getting any relief from this.  That effect I do believe an MRI is needed for both hips to rule out an underlying tear that potentially may need to be intervened upon.  Plan :    -And for bilateral hip MRIs to rule out gluteus medius tearing     I personally saw and evaluated the patient, and participated in the management and treatment plan.  Vanetta Mulders, MD Attending Physician, Orthopedic Surgery  This document was dictated using Dragon  voice recognition software. A reasonable attempt at proof reading has been made to minimize errors.

## 2022-08-31 ENCOUNTER — Encounter (HOSPITAL_BASED_OUTPATIENT_CLINIC_OR_DEPARTMENT_OTHER): Payer: Self-pay | Admitting: Orthopaedic Surgery

## 2022-09-05 ENCOUNTER — Ambulatory Visit (INDEPENDENT_AMBULATORY_CARE_PROVIDER_SITE_OTHER): Payer: 59 | Admitting: Orthopaedic Surgery

## 2022-09-05 DIAGNOSIS — M25552 Pain in left hip: Secondary | ICD-10-CM

## 2022-09-05 DIAGNOSIS — M67959 Unspecified disorder of synovium and tendon, unspecified thigh: Secondary | ICD-10-CM

## 2022-09-05 DIAGNOSIS — M25551 Pain in right hip: Secondary | ICD-10-CM

## 2022-09-05 DIAGNOSIS — M67951 Unspecified disorder of synovium and tendon, right thigh: Secondary | ICD-10-CM | POA: Diagnosis not present

## 2022-09-05 MED ORDER — LIDOCAINE HCL 1 % IJ SOLN
4.0000 mL | INTRAMUSCULAR | Status: AC | PRN
  Administered 2022-09-05: 4 mL

## 2022-09-05 MED ORDER — TRIAMCINOLONE ACETONIDE 40 MG/ML IJ SUSP
80.0000 mg | INTRAMUSCULAR | Status: AC | PRN
  Administered 2022-09-05: 80 mg via INTRA_ARTICULAR

## 2022-09-05 NOTE — Progress Notes (Signed)
Chief Complaint: Bilateral hip pain     History of Present Illness:   09/05/2022: Presents today for follow-up as she is overall continue to have pain particular on the right side.  She is hoping to get a right hip gluteus injection to get some pain relief.  Samantha Becker is a 67 y.o. female presents today with right worse than left hip pain which has been going on now for approximately a year.  She has previously undergone physical therapy for strengthening of both hips as well as balance although this only provided a limited period of relief.  At this time she is having a difficult time doing basic activities like walking or yoga.  She states that she is having a hard time laying directly on the right side and this wakes her up at night.  There is no radiation.  There is weakness.    Surgical History:   none  PMH/PSH/Family History/Social History/Meds/Allergies:    Past Medical History:  Diagnosis Date  . Arthritis   . Chicken pox   . GERD (gastroesophageal reflux disease)    OTC  . Leaking of urine   . Low iron   . Mumps    Past Surgical History:  Procedure Laterality Date  . CESAREAN SECTION     4 cs  . LAPAROSCOPY  03/04/2012   Procedure: LAPAROSCOPY OPERATIVE;  Surgeon: Eldred Manges, MD;  Location: [facility];  Service: Gynecology;  Laterality: N/A;  Peritoneum Biopsy  . SHOULDER ARTHROSCOPY Right 03/20/2022   Procedure: RIGHT SHOULDER ARTHROSCOPY WITH DISTAL CLAVICLE RESECTION;  Surgeon: Vanetta Mulders, MD;  Location: [place];  Service: Orthopedics;  Laterality: Right;  . TUBAL LIGATION Bilateral    Social History   Socioeconomic History  . Marital status: Married    Spouse name: Not on file  . Number of children: Not on file  . Years of education: Not on file  . Highest education level: Not on file  Occupational History  . Occupation: TEACHER     Employer: [employer]  Tobacco Use  . Smoking  status: Never  . Smokeless tobacco: Never  Substance and Sexual Activity  . Alcohol use: Yes    Alcohol/week: 4.0 standard drinks of alcohol    Types: 2 Glasses of wine, 2 Cans of beer per week    Comment: wine nightly  . Drug use: No  . Sexual activity: Yes    Birth control/protection: Surgical, Post-menopausal    Comment: BTL  Other Topics Concern  . 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 Heart disease Father   . Hypertension Father   . Cancer Father   . Stroke Father   . Heart disease Mother   . Hypertension Mother   . Arthritis Mother   . Breast cancer Mother   . Osteoporosis Mother   . Arthritis Sister   . Asthma Sister    Allergies  Allergen Reactions  .  Morphine Nausea Only   Current Outpatient Medications  Medication Sig Dispense Refill  . amoxicillin-clavulanate (AUGMENTIN) 875-125 MG tablet Take 1 tablet by mouth 2 (two) times daily. 20 tablet 0  . aspirin EC 325 MG tablet TAKE 1 TABLET BY MOUTH EVERY DAY (Patient not taking: Reported on 08/24/2022) 30 tablet 0  . benzonatate (TESSALON) 100 MG capsule Take 1 capsule (100 mg total) by mouth 3 (three) times daily as needed for cough. 30 capsule 0  . conjugated estrogens (PREMARIN) vaginal cream Premarin 0.625 mg/gram vaginal cream  Insert 0.5 applicatorsful twice a week by vaginal route. 42.5 g 2  . diclofenac Sodium (VOLTAREN) 1 % GEL Apply 2 g topically as needed.    . fluticasone (FLONASE) 50 MCG/ACT nasal spray Place 1 spray into both nostrils as needed for allergies or rhinitis.    Marland Kitchen ibuprofen (ADVIL) 200 MG tablet Take 800 mg by mouth every 6 (six) hours as needed.    .  triamcinolone cream (KENALOG) 0.1 % Apply to affected area 1-2 times daily 30 g 0   No current facility-administered medications for this visit.   No results found.  Review of Systems:   A ROS was performed including pertinent positives and negatives as documented in the HPI.  Physical Exam :   Constitutional: NAD and appears stated age Neurological: Alert and oriented Psych: Appropriate affect and cooperative There were no vitals taken for this visit.   Comprehensive Musculoskeletal Exam:    Inspection Right Left  Skin No atrophy or gross abnormalities appreciated No atrophy or gross abnormalities appreciated  Palpation    Tenderness Greater trochanter Greater trochanter  Crepitus None None  Range of Motion    Flexion (passive) 120 120  Extension 30 30  IR 30 30  ER 45 45  Strength    Flexion  5/5 5/5  Extension 5/5 5/5  Special Tests    FABER Negative Negative  FADIR Negative Negative  ER Lag/Capsular Insufficiency Negative Negative  Instability Negative Negative  Sacroiliac pain Negative  Negative   Instability    Generalized Laxity No No  Neurologic    sciatic, femoral, obturator nerves intact to light sensation  Vascular/Lymphatic    DP pulse 2+ 2+  Lumbar Exam    Patient has symmetric lumbar range of motion with negative pain referral to hip     Imaging:   Xray (ap pelvis, right hip 3 views, left hip 3 views, lumbar spine 4 views): Normal hips bilaterally, degenerative disc disease involving the lumbar spine  I personally reviewed and interpreted the radiographs.   Assessment:   67 y.o. female with right worse than left hip pain consistent with gluteus medius tears.  At this time she has trialed physical therapy for multiple months.  She is not getting any relief from this.  That effect I do believe an MRI is needed for both hips to rule out an underlying tear that potentially may need to be intervened upon.  Today she is interested in a right hip gluteus  medius injection.  She would like to proceed with this today.  Plan :    -And for bilateral hip MRIs to rule out gluteus medius tearing -Right hip ultrasound-guided gluteus medius injection performed with verbal consent obtained    Procedure Note  Patient: Samantha Becker             Date of Birth: 1955/09/26           MRN: [id number]  Visit Date: 09/05/2022  Procedures: Visit Diagnoses: No diagnosis found.  Large Joint Inj: R greater trochanter on 09/05/2022 11:30 AM Indications: pain Details: 22 G 3.5 in needle, ultrasound-guided anterolateral approach  Arthrogram: No  Medications: 4 mL lidocaine 1 %; 80 mg triamcinolone acetonide 40 MG/ML Outcome: tolerated well, no immediate complications Procedure, treatment alternatives, risks and benefits explained, specific risks discussed. Consent was given by the patient. Immediately prior to procedure a time out was called to verify the correct patient, procedure, equipment, support staff and site/side marked as required. Patient was prepped and draped in the usual sterile fashion.          I personally saw and evaluated the patient, and participated in the management and treatment plan.  Vanetta Mulders, MD Attending Physician, Orthopedic Surgery  This document was dictated using Dragon voice recognition software. A reasonable attempt at proof reading has been made to minimize errors.

## 2022-09-14 ENCOUNTER — Ambulatory Visit: Payer: 59 | Admitting: Physician Assistant

## 2022-09-17 ENCOUNTER — Ambulatory Visit (INDEPENDENT_AMBULATORY_CARE_PROVIDER_SITE_OTHER): Payer: 59 | Admitting: Physician Assistant

## 2022-09-17 ENCOUNTER — Encounter: Payer: Self-pay | Admitting: Physician Assistant

## 2022-09-17 VITALS — BP 120/80 | HR 78 | Temp 97.3°F | Ht 62.0 in | Wt 131.0 lb

## 2022-09-17 DIAGNOSIS — H6991 Unspecified Eustachian tube disorder, right ear: Secondary | ICD-10-CM

## 2022-09-17 NOTE — Patient Instructions (Signed)
It was great to see you!  Start regular use of over the counter antihistamines such as Zyrtec (cetirizine), Claritin (loratadine), Allegra (fexofenadine), or Xyzal (levocetirizine) daily as needed. May take twice a day if needed as long as it does not cause drowsiness. Start Nasal saline spray (i.e., Simply Saline) or nasal saline lavage (i.e., NeilMed) is recommended as needed and prior to medicated nasal sprays. Continue Flonase 1 spray twice a day.  Call if any concerns  Take care,  Inda Coke PA-C

## 2022-09-17 NOTE — Progress Notes (Signed)
Samantha Becker is a 67 y.o. female here for follow-up of a pre-existing problem.  History of Present Illness:   Chief Complaint  Patient presents with   Otalgia    Pt c/o right ear pain, worsening. Has had since 12/29, finished antibiotics never went away.    Otalgia     R ear pain She was seen by my colleague, Vance Peper, at [facility] [place] on 08/24/2022 for bronchitis and acute otitis media.  Note reviewed.   She was prescribed Augmentin and Tessalon Perles.  She states that she took all of the Augmentin but did have to pause taking them because she had a stomach bug near the end of this antibiotic. She feels like her symptoms never fully went away and her ear pain persists She describes as a dull ear pain she does continue to use Flonase daily, and does use saline nasal spray prior to administration  Denies any recent air travel or fever  Past Medical History:  Diagnosis Date   Arthritis    Chicken pox    GERD (gastroesophageal reflux disease)    OTC   Leaking of urine    Low iron    Mumps      Social History   Tobacco Use   Smoking status: Never   Smokeless tobacco: Never  Substance Use Topics   Alcohol use: Yes    Alcohol/week: 4.0 standard drinks of alcohol    Types: 2 Glasses of wine, 2 Cans of beer per week    Comment: wine nightly   Drug use: No    Past Surgical History:  Procedure Laterality Date   CESAREAN SECTION     4 cs   LAPAROSCOPY  03/04/2012   Procedure: LAPAROSCOPY OPERATIVE;  Surgeon: Eldred Manges, MD;  Location: [facility];  Service: Gynecology;  Laterality: N/A;  Peritoneum Biopsy   SHOULDER ARTHROSCOPY Right 03/20/2022   Procedure: RIGHT SHOULDER ARTHROSCOPY WITH DISTAL CLAVICLE RESECTION;  Surgeon: Vanetta Mulders, MD;  Location: [place];  Service: Orthopedics;  Laterality: Right;   TUBAL LIGATION Bilateral     Family History  Problem Relation Age of Onset   Heart disease Father    Hypertension Father     Cancer Father    Stroke Father    Heart disease Mother    Hypertension Mother    Arthritis Mother    Breast cancer Mother    Osteoporosis Mother    Arthritis Sister    Asthma Sister     Allergies  Allergen Reactions   Morphine Nausea Only    Current Medications:   Current Outpatient Medications:    conjugated estrogens (PREMARIN) vaginal cream, Premarin 0.625 mg/gram vaginal cream  Insert 0.5 applicatorsful twice a week by vaginal route., Disp: 42.5 g, Rfl: 2   diclofenac Sodium (VOLTAREN) 1 % GEL, Apply 2 g topically as needed., Disp: , Rfl:    fluticasone (FLONASE) 50 MCG/ACT nasal spray, Place 1 spray into both nostrils as needed for allergies or rhinitis., Disp: , Rfl:    ibuprofen (ADVIL) 200 MG tablet, Take 800 mg by mouth every 6 (six) hours as needed., Disp: , Rfl:    Review of Systems:   Review of Systems  HENT:  Positive for ear pain.    Negative unless otherwise specified per HPI.  Vitals:   Vitals:   09/17/22 0905  BP: 120/80  Pulse: 78  Temp: (!) 97.3 F (36.3 C)  TempSrc: Temporal  SpO2: 99%  Weight: 131 lb (  59.4 kg)  Height: '5\' 2"'$  (1.575 m)     Body mass index is 23.96 kg/m.  Physical Exam:   Physical Exam Vitals and nursing note reviewed.  Constitutional:      General: She is not in acute distress.    Appearance: She is well-developed. She is not ill-appearing or toxic-appearing.  HENT:     Head: Normocephalic and atraumatic.     Right Ear: Ear canal and external ear normal. A middle ear effusion is present. Tympanic membrane is not erythematous, retracted or bulging.     Left Ear: Ear canal and external ear normal. A middle ear effusion is present. Tympanic membrane is not erythematous, retracted or bulging.     Nose: Nose normal.     Right Sinus: No maxillary sinus tenderness or frontal sinus tenderness.     Left Sinus: No maxillary sinus tenderness or frontal sinus tenderness.     Mouth/Throat:     Pharynx: Uvula midline. No  posterior oropharyngeal erythema.  Eyes:     General: Lids are normal.     Conjunctiva/sclera: Conjunctivae normal.  Neck:     Trachea: Trachea normal.  Cardiovascular:     Rate and Rhythm: Normal rate and regular rhythm.     Heart sounds: Normal heart sounds, S1 normal and S2 normal.  Pulmonary:     Effort: Pulmonary effort is normal.     Breath sounds: Normal breath sounds. No decreased breath sounds, wheezing, rhonchi or rales.  Lymphadenopathy:     Cervical: No cervical adenopathy.  Skin:    General: Skin is warm and dry.  Neurological:     Mental Status: She is alert.  Psychiatric:        Speech: Speech normal.        Behavior: Behavior normal. Behavior is cooperative.     Assessment and Plan:   Dysfunction of right eustachian tube No red flags on my exam Advised patient as follows: Start regular use of over the counter antihistamines such as Zyrtec (cetirizine), Claritin (loratadine), Allegra (fexofenadine), or Xyzal (levocetirizine) daily as needed. May take twice a day if needed as long as it does not cause drowsiness. Start Nasal saline spray (i.e., Simply Saline) or nasal saline lavage (i.e., NeilMed) is recommended as needed and prior to medicated nasal sprays. Continue Flonase 1 spray twice a day.  Call if concerns or lack of improvement   Inda Coke, PA-C

## 2022-09-22 ENCOUNTER — Ambulatory Visit
Admission: RE | Admit: 2022-09-22 | Discharge: 2022-09-22 | Disposition: A | Discharge status: Home / Self Care | Payer: 59 | Source: Ambulatory Visit | Attending: Orthopaedic Surgery | Admitting: Orthopaedic Surgery

## 2022-09-22 DIAGNOSIS — M67959 Unspecified disorder of synovium and tendon, unspecified thigh: Secondary | ICD-10-CM

## 2022-09-27 ENCOUNTER — Ambulatory Visit (INDEPENDENT_AMBULATORY_CARE_PROVIDER_SITE_OTHER): Payer: 59 | Admitting: Orthopaedic Surgery

## 2022-09-27 DIAGNOSIS — M67959 Unspecified disorder of synovium and tendon, unspecified thigh: Secondary | ICD-10-CM

## 2022-09-27 NOTE — Progress Notes (Signed)
Chief Complaint: Bilateral hip pain     History of Present Illness:   09/27/2022: Presents today for follow-up of her bilateral hip MRIs.  She is still having persistent pain on the right side laterally particularly when she is sitting down for longer period of time.  She is not able to lay directly on the side.  This is remaining quite bothersome to her.  She has specifically worked on strengthening the hips without any relief.  Samantha Becker is a 67 y.o. female presents today with right worse than left hip pain which has been going on now for approximately a year.  She has previously undergone physical therapy for strengthening of both hips as well as balance although this only provided a limited period of relief.  At this time she is having a difficult time doing basic activities like walking or yoga.  She states that she is having a hard time laying directly on the right side and this wakes her up at night.  There is no radiation.  There is weakness.    Surgical History:   none  PMH/PSH/Family History/Social History/Meds/Allergies:    Past Medical History:  Diagnosis Date   Arthritis    Chicken pox    GERD (gastroesophageal reflux disease)    OTC   Leaking of urine    Low iron    Mumps    Past Surgical History:  Procedure Laterality Date   CESAREAN SECTION     4 cs   LAPAROSCOPY  03/04/2012   Procedure: LAPAROSCOPY OPERATIVE;  Surgeon: Eldred Manges, MD;  Location: [facility];  Service: Gynecology;  Laterality: N/A;  Peritoneum Biopsy   SHOULDER ARTHROSCOPY Right 03/20/2022   Procedure: RIGHT SHOULDER ARTHROSCOPY WITH DISTAL CLAVICLE RESECTION;  Surgeon: Vanetta Mulders, MD;  Location: [place];  Service: Orthopedics;  Laterality: Right;   TUBAL LIGATION Bilateral    Social History   Socioeconomic History   Marital status: Married    Spouse name: Not on file   Number of children: Not on file   Years of education:  Not on file   Highest education level: Not on file  Occupational History   Occupation: TEACHER     Employer: [employer]  Tobacco Use   Smoking status: Never   Smokeless tobacco: Never  Substance and Sexual Activity   Alcohol use: Yes    Alcohol/week: 4.0 standard drinks of alcohol    Types: 2 Glasses of wine, 2 Cans of beer per week    Comment: wine nightly   Drug use: No   Sexual activity: Yes    Birth control/protection: Surgical, Post-menopausal    Comment: BTL  Other Topics Concern   Not on file  Social History Narrative   Prior Pharmacist, hospital at [employer], 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   Social Determinants of Health   Financial Resource Strain: Not on file  Food Insecurity: Not on file  Transportation Needs: Not on file  Physical Activity: Not on file  Stress: Not on file  Social Connections: Not on file   Family History  Problem Relation Age of Onset   Heart disease Father    Hypertension Father    Cancer Father    Stroke Father    Heart disease Mother    Hypertension Mother    Arthritis Mother  Breast cancer Mother    Osteoporosis Mother    Arthritis Sister    Asthma Sister    Allergies  Allergen Reactions   Morphine Nausea Only   Current Outpatient Medications  Medication Sig Dispense Refill   conjugated estrogens (PREMARIN) vaginal cream Premarin 0.625 mg/gram vaginal cream  Insert 0.5 applicatorsful twice a week by vaginal route. 42.5 g 2   diclofenac Sodium (VOLTAREN) 1 % GEL Apply 2 g topically as needed.     fluticasone (FLONASE) 50 MCG/ACT nasal spray Place 1 spray into both nostrils as needed for allergies or rhinitis.     ibuprofen (ADVIL) 200 MG tablet Take 800 mg by mouth every 6 (six) hours as needed.     No current facility-administered medications for this visit.   No results found.  Review of Systems:   A ROS was performed including pertinent positives and negatives as documented in the HPI.  Physical Exam :   Constitutional: NAD and appears  stated age Neurological: Alert and oriented Psych: Appropriate affect and cooperative There were no vitals taken for this visit.   Comprehensive Musculoskeletal Exam:    Inspection Right Left  Skin No atrophy or gross abnormalities appreciated No atrophy or gross abnormalities appreciated  Palpation    Tenderness Greater trochanter Greater trochanter  Crepitus None None  Range of Motion    Flexion (passive) 120 120  Extension 30 30  IR 30 30  ER 45 45  Strength    Flexion  5/5 5/5  Extension 5/5 5/5  Special Tests    FABER Negative Negative  FADIR Negative Negative  ER Lag/Capsular Insufficiency Negative Negative  Instability Negative Negative  Sacroiliac pain Negative  Negative   Instability    Generalized Laxity No No  Neurologic    sciatic, femoral, obturator nerves intact to light sensation  Vascular/Lymphatic    DP pulse 2+ 2+  Lumbar Exam    Patient has symmetric lumbar range of motion with negative pain referral to hip     Imaging:   Xray (ap pelvis, right hip 3 views, left hip 3 views, lumbar spine 4 views): Normal hips bilaterally, degenerative disc disease involving the lumbar spine  MRI bilateral hip: She has a right worse than left undersurface gluteus medius tear partial-thickness I personally reviewed and interpreted the radiographs.   Assessment:   68 y.o. female with right worse than left hip pain consistent with gluteus medius tears.  At this time she has trialed physical therapy for multiple months.  She is not getting any relief from this.  Given the fact that she did not have any relief from therapy I did inject her at last visit.  She got 1 week of very significant relief.  That being said her pain is recurred.  Her MRIs are consistent with tearing of the undersurface of gluteus medius.  Side effect we did discuss additional options such as shock therapy or PRP versus surgical repair.  She would like time to weigh all of her options.  She will send  [place] a MyChart message when she is further considered  Plan :    -She will send [place] a MyChart message when she is further considered her options      I personally saw and evaluated the patient, and participated in the management and treatment plan.  Vanetta Mulders, MD Attending Physician, Orthopedic Surgery  This document was dictated using Dragon voice recognition software. A reasonable attempt at proof reading has been made to minimize errors.

## 2022-10-11 ENCOUNTER — Encounter (HOSPITAL_BASED_OUTPATIENT_CLINIC_OR_DEPARTMENT_OTHER): Payer: Self-pay | Admitting: Orthopaedic Surgery

## 2022-10-12 ENCOUNTER — Other Ambulatory Visit (HOSPITAL_BASED_OUTPATIENT_CLINIC_OR_DEPARTMENT_OTHER): Payer: Self-pay | Admitting: Orthopaedic Surgery

## 2022-10-12 DIAGNOSIS — M67959 Unspecified disorder of synovium and tendon, unspecified thigh: Secondary | ICD-10-CM

## 2022-10-18 ENCOUNTER — Ambulatory Visit: Payer: 59 | Admitting: Sports Medicine

## 2022-10-19 ENCOUNTER — Ambulatory Visit (INDEPENDENT_AMBULATORY_CARE_PROVIDER_SITE_OTHER): Payer: 59 | Admitting: Sports Medicine

## 2022-10-19 DIAGNOSIS — M67959 Unspecified disorder of synovium and tendon, unspecified thigh: Secondary | ICD-10-CM | POA: Diagnosis not present

## 2022-10-19 DIAGNOSIS — M25551 Pain in right hip: Secondary | ICD-10-CM | POA: Diagnosis not present

## 2022-10-19 NOTE — Progress Notes (Signed)
[facility] referral to discuss Shockwave therapy  She has tried injection therapy which helped for 1 week She has tried formalized PT with not much relief She has also done a trial of monthly deep tissue massage which does help some. She takes advil for pain relief, with minimal benefit

## 2022-10-19 NOTE — Progress Notes (Signed)
Samantha Becker - 67 y.o. female MRN [id number]  Date of birth: 14-Oct-1955  Office Visit Note: Visit Date: 10/19/2022 PCP: Inda Coke, PA Referred by: Inda Coke, [place]  Subjective: Chief Complaint  Patient presents with   Right Hip - Pain   HPI: Samantha Becker is a pleasant 67 y.o. female who presents today for right lateral hip pain with partial tearing of gluteus medius.  Samantha Becker states today that she has had lateral hip pain on and off for about 40 years or more.  However, this has been worse over the last 1-2 years.  She has seen my partner, Dr. Sammuel Hines, he did have a greater troches/gluteus medius injection that gave her excellent relief but only for 1 week.  She has tried formalized physical therapy without much relief.  She also does deep tissue massages which help to some extent.  Uses Advil for pain control as needed, only some benefit.  She does continue with home exercises to work on strengthening and stabilization of the hips.  Pertinent ROS were reviewed with the patient and found to be negative unless otherwise specified above in HPI.   Assessment & Plan: Visit Diagnoses:  1. Tendinopathy of gluteus medius   2. Pain in right hip    Plan: Discussed with Samantha Becker treatment options for her lateral hip pain.  This has been a chronic issue for her, her strength is actually pretty good so I do not think this is as much emanating from muscular imbalance.  She does have some partial tearing noted on her MRI.  History of decision making, elected to proceed with extracorporeal shockwave treatment therapy.  Will have her follow-up in 1 week for repeat treatment and see if she is at least 25% improved after 2 visits.  If so, we will continue weekly treatments.  Treatment options consideration include nitroglycerin patch protocol, prescription based anti-inflammatories.  She will continue her home exercises.  Follow-up: Return in about 1 week (around 10/26/2022) for for  lateral hip pain.   Meds & Orders: No orders of the defined types were placed in this encounter.  No orders of the defined types were placed in this encounter.    Procedures: Procedure: ECSWT Indications: Gluteus medius tendinopathy; greater trochanteric pain syndrome   Procedure Details Consent: Risks of procedure as well as the alternatives and risks of each were explained to the patient.  Verbal consent for procedure obtained. Time Out: Verified patient identification, verified procedure, site was marked, verified correct patient position. The area was cleaned with alcohol swab.     The right greater trochanter and gluteus medius tendon was targeted for Extracorporeal shockwave therapy.    Preset: Greater Trochanteric Bursitis Power Level: 110 mJ Frequency: 10 Hz Impulse/cycles: 2500 Head size: Regular   Patient tolerated procedure well without immediate complications.         Clinical History: No specialty comments available.  She reports that she has never smoked. She has never used smokeless tobacco. No results for input(s): "HGBA1C", "LABURIC" in the last 8760 hours.  Objective:   Vital Signs: LMP  (LMP Unknown)   Physical Exam  Gen: Well-appearing, in no acute distress; non-toxic CV:  Well-perfused. Warm.  Resp: Breathing unlabored on room air; no wheezing. Psych: Fluid speech in conversation; appropriate affect; normal thought process Neuro: Sensation intact throughout. No gross coordination deficits.   Ortho Exam - Right hip: Evaluation of the right lateral hip shows no overlying swelling or redness.  There is positive TTP over  the posterior lateral aspect of the greater trochanter and the insertional gluteus medius tendons.  There is full range of motion about the hip without mechanical block.  There is fairly equivocal and good strength with hip abduction bilaterally.  Imaging: MR HIP RIGHT WO CONTRAST CLINICAL DATA:  Chronic right hip pain. Tendon/bursal  abnormality suspected.  EXAM: MR OF THE LEFT HIP WITHOUT CONTRAST  MR OF THE RIGHT HIP WITHOUT CONTRAST  TECHNIQUE: Multiplanar, multisequence MR imaging was performed of the left hip. Multiplanar, multisequence MR imaging was performed of the right hip. No intravenous contrast was administered.  COMPARISON:  Pelvis and bilateral hip radiographs 08/29/2022  FINDINGS: Bones: No acute fracture or avascular necrosis is seen within the visualized portion of the pelvis or either proximal femur. Mild-to-moderate pubic symphysis joint space narrowing and peripheral osteophytosis.  Articular cartilage and labrum  Right hip:  Articular cartilage: Mild-to-moderate anterior superior right acetabular and femoral head cartilage thinning. Mild anterior superior right acetabular subchondral marrow edema.  Labrum: Lack of intra-articular fluid limits evaluation of the right acetabular labrum. No gross labral tear is seen.  Left hip:  Articular cartilage: Mild-to-moderate anterior superior left acetabular and femoral head cartilage thinning. Mild anterior superolateral left femoral head subchondral marrow edema (left hip coronal series 2, image 17).  Labrum: Lack of intra-articular fluid limits evaluation of the left acetabular labrum. No gross left acetabular labral tear is seen, although there is likely mild peripheral degenerative fraying of the anterior superior left acetabular labrum.  Joint or bursal effusion  Joint effusion:  No joint effusion within either hip.  Bursae:  No trochanteric bursitis on either side.  Muscles and tendons  Muscles and tendons: The origins of the bilateral rectus femoris tendons are intact. There is intermediate T2 signal indicating mild left and minimal right common hamstring origin tendinosis. No fluid bright tear or tendon retraction. The insertions of the bilateral gluteus minimus, gluteus medius, and iliopsoas tendons are intact.  Other  findings  Miscellaneous: The rectus abdominis-adductor aponeuroses are intact.  The intrapelvic soft tissues are grossly unremarkable.  IMPRESSION: 1. Mild-to-moderate bilateral anterior superior acetabular and femoral head cartilage thinning. 2. Mild-to-moderate pubic symphysis osteoarthritis. 3. Mild left and minimal right common hamstring origin tendinosis. No fluid bright tear or tendon retraction.  Electronically Signed   By: Yvonne Kendall M.D.   On: 10/01/2022 17:05 MR HIP LEFT WO CONTRAST CLINICAL DATA:  Chronic right hip pain. Tendon/bursal abnormality suspected.  EXAM: MR OF THE LEFT HIP WITHOUT CONTRAST  MR OF THE RIGHT HIP WITHOUT CONTRAST  TECHNIQUE: Multiplanar, multisequence MR imaging was performed of the left hip. Multiplanar, multisequence MR imaging was performed of the right hip. No intravenous contrast was administered.  COMPARISON:  Pelvis and bilateral hip radiographs 08/29/2022  FINDINGS: Bones: No acute fracture or avascular necrosis is seen within the visualized portion of the pelvis or either proximal femur. Mild-to-moderate pubic symphysis joint space narrowing and peripheral osteophytosis.  Articular cartilage and labrum  Right hip:  Articular cartilage: Mild-to-moderate anterior superior right acetabular and femoral head cartilage thinning. Mild anterior superior right acetabular subchondral marrow edema.  Labrum: Lack of intra-articular fluid limits evaluation of the right acetabular labrum. No gross labral tear is seen.  Left hip:  Articular cartilage: Mild-to-moderate anterior superior left acetabular and femoral head cartilage thinning. Mild anterior superolateral left femoral head subchondral marrow edema (left hip coronal series 2, image 17).  Labrum: Lack of intra-articular fluid limits evaluation of the left acetabular labrum. No gross left  acetabular labral tear is seen, although there is likely mild peripheral  degenerative fraying of the anterior superior left acetabular labrum.  Joint or bursal effusion  Joint effusion:  No joint effusion within either hip.  Bursae:  No trochanteric bursitis on either side.  Muscles and tendons  Muscles and tendons: The origins of the bilateral rectus femoris tendons are intact. There is intermediate T2 signal indicating mild left and minimal right common hamstring origin tendinosis. No fluid bright tear or tendon retraction. The insertions of the bilateral gluteus minimus, gluteus medius, and iliopsoas tendons are intact.  Other findings  Miscellaneous: The rectus abdominis-adductor aponeuroses are intact.  The intrapelvic soft tissues are grossly unremarkable.  IMPRESSION: 1. Mild-to-moderate bilateral anterior superior acetabular and femoral head cartilage thinning. 2. Mild-to-moderate pubic symphysis osteoarthritis. 3. Mild left and minimal right common hamstring origin tendinosis. No fluid bright tear or tendon retraction.  Electronically Signed   By: Yvonne Kendall M.D.   On: 10/01/2022 17:05    Past Medical/Family/Surgical/Social History: Medications & Allergies reviewed per EMR, new medications updated. Patient Active Problem List   Diagnosis Date Noted   Osteoarthritis of AC (acromioclavicular) joint    Endolymphatic hydrops of right ear 08/18/2020   Right-sided sensorineural hearing loss 08/18/2020   Atrophic vaginitis 03/20/2017   Endometriosis 03/18/2012   Dyspareunia 02/01/2012   Past Medical History:  Diagnosis Date   Arthritis    Chicken pox    GERD (gastroesophageal reflux disease)    OTC   Leaking of urine    Low iron    Mumps    Family History  Problem Relation Age of Onset   Heart disease Father    Hypertension Father    Cancer Father    Stroke Father    Heart disease Mother    Hypertension Mother    Arthritis Mother    Breast cancer Mother    Osteoporosis Mother    Arthritis Sister    Asthma Sister     Past Surgical History:  Procedure Laterality Date   CESAREAN SECTION     4 cs   LAPAROSCOPY  03/04/2012   Procedure: LAPAROSCOPY OPERATIVE;  Surgeon: Eldred Manges, MD;  Location: [facility];  Service: Gynecology;  Laterality: N/A;  Peritoneum Biopsy   SHOULDER ARTHROSCOPY Right 03/20/2022   Procedure: RIGHT SHOULDER ARTHROSCOPY WITH DISTAL CLAVICLE RESECTION;  Surgeon: Vanetta Mulders, MD;  Location: [facility];  Service: Orthopedics;  Laterality: Right;   TUBAL LIGATION Bilateral    Social History   Occupational History   Occupation: TEACHER     Employer: [employer]  Tobacco Use   Smoking status: Never   Smokeless tobacco: Never  Substance and Sexual Activity   Alcohol use: Yes    Alcohol/week: 4.0 standard drinks of alcohol    Types: 2 Glasses of wine, 2 Cans of beer per week    Comment: wine nightly   Drug use: No   Sexual activity: Yes    Birth control/protection: Surgical, Post-menopausal    Comment: BTL

## 2022-10-25 ENCOUNTER — Ambulatory Visit (INDEPENDENT_AMBULATORY_CARE_PROVIDER_SITE_OTHER): Payer: 59 | Admitting: Sports Medicine

## 2022-10-25 ENCOUNTER — Encounter: Payer: Self-pay | Admitting: Sports Medicine

## 2022-10-25 DIAGNOSIS — M25551 Pain in right hip: Secondary | ICD-10-CM

## 2022-10-25 DIAGNOSIS — M67959 Unspecified disorder of synovium and tendon, unspecified thigh: Secondary | ICD-10-CM

## 2022-10-25 NOTE — Progress Notes (Signed)
She states she is pleasantly surprised she is doing better after the first shockwave treatment

## 2022-10-25 NOTE — Progress Notes (Signed)
Samantha Becker - 67 y.o. female MRN [id number]  Date of birth: 09/02/1955  Office Visit Note: Visit Date: 10/25/2022 PCP: Inda Coke, PA Referred by: Inda Coke, [place]  Subjective: Chief Complaint  Patient presents with   Right Hip - Follow-up   HPI: Samantha Becker is a pleasant 67 y.o. female who presents today for follow-up of right lateral hip pain with partial tearing of the gluteus medius.  As a reminder, Samantha Becker states today that she has had lateral hip pain on and off for about 40 years or more.  However, this has been worse over the last 1-2 years.  She has seen my partner, Dr. Sammuel Hines, he did have a greater troches/gluteus medius injection that gave her excellent relief but only for 1 week.  She has tried formalized physical therapy without much relief.  She also does deep tissue massages which help to some extent.  Uses Advil for pain control as needed, only some benefit.   Today, patient states that she was pleasantly surprised the amount of pain relief she received from the first shockwave treatment.  Feels like she had 60% improvement in her pain. Less pain with activity and not as bothersome at night.   Pertinent ROS were reviewed with the patient and found to be negative unless otherwise specified above in HPI.   Assessment & Plan: Visit Diagnoses:  1. Pain in right hip   2. Tendinopathy of gluteus medius    Plan: Both Samantha Becker and I were very pleased that she had such good improvement from the first extracorporeal shockwave treatment therapy.  Through shared decision making, elected to proceed with repeat treatment today.  Limited benefit, we will plan to proceed with multiple treatments, spacing them out 1 week apart with reevaluation.  She may continue Advil for pain control as needed.  Other treatment options would be consideration of prescription based anti-inflammatories versus nitroglycerin patch protocol, although given her improvement we will hold on  these for now.  She will continue her home hip stabilization and rehab exercises.  Follow-up: Return in about 1 week (around 11/01/2022) for for right lat hip.   Meds & Orders: No orders of the defined types were placed in this encounter.  No orders of the defined types were placed in this encounter.    Procedures: Procedure: ECSWT Indications: Gluteus medius tendinopathy; greater trochanteric pain syndrome   Procedure Details Consent: Risks of procedure as well as the alternatives and risks of each were explained to the patient.  Verbal consent for procedure obtained. Time Out: Verified patient identification, verified procedure, site was marked, verified correct patient position. The area was cleaned with alcohol swab.     The right greater trochanter and gluteus medius tendon was targeted for Extracorporeal shockwave therapy.    Preset: Greater Trochanteric Bursitis Power Level: 110 mJ Frequency: 10 Hz Impulse/cycles: 3000 Head size: Regular   Patient tolerated procedure well without immediate complications.      Clinical History: No specialty comments available.  She reports that she has never smoked. She has never used smokeless tobacco. No results for input(s): "HGBA1C", "LABURIC" in the last 8760 hours.  Objective:   Vital Signs: LMP  (LMP Unknown)   Physical Exam  Gen: Well-appearing, in no acute distress; non-toxic CV: Regular Rate. Well-perfused. Warm.  Resp: Breathing unlabored on room air; no wheezing. Psych: Fluid speech in conversation; appropriate affect; normal thought process Neuro: Sensation intact throughout. No gross coordination deficits.   Ortho Exam - Right hip:  Evaluation of the right lateral hip shows no redness or swelling.  There is TTP over the posterior lateral aspect of the greater trochanter near the insertion of the gluteus medius and minimus tendons, although improved from prior.  There is equivocal strength of the hips  bilaterally.  Imaging: No results found.  Past Medical/Family/Surgical/Social History: Medications & Allergies reviewed per EMR, new medications updated. Patient Active Problem List   Diagnosis Date Noted   Osteoarthritis of AC (acromioclavicular) joint    Endolymphatic hydrops of right ear 08/18/2020   Right-sided sensorineural hearing loss 08/18/2020   Atrophic vaginitis 03/20/2017   Endometriosis 03/18/2012   Dyspareunia 02/01/2012   Past Medical History:  Diagnosis Date   Arthritis    Chicken pox    GERD (gastroesophageal reflux disease)    OTC   Leaking of urine    Low iron    Mumps    Family History  Problem Relation Age of Onset   Heart disease Father    Hypertension Father    Cancer Father    Stroke Father    Heart disease Mother    Hypertension Mother    Arthritis Mother    Breast cancer Mother    Osteoporosis Mother    Arthritis Sister    Asthma Sister    Past Surgical History:  Procedure Laterality Date   CESAREAN SECTION     4 cs   LAPAROSCOPY  03/04/2012   Procedure: LAPAROSCOPY OPERATIVE;  Surgeon: Eldred Manges, MD;  Location: [facility];  Service: Gynecology;  Laterality: N/A;  Peritoneum Biopsy   SHOULDER ARTHROSCOPY Right 03/20/2022   Procedure: RIGHT SHOULDER ARTHROSCOPY WITH DISTAL CLAVICLE RESECTION;  Surgeon: Vanetta Mulders, MD;  Location: [facility];  Service: Orthopedics;  Laterality: Right;   TUBAL LIGATION Bilateral    Social History   Occupational History   Occupation: TEACHER     Employer: [employer]  Tobacco Use   Smoking status: Never   Smokeless tobacco: Never  Substance and Sexual Activity   Alcohol use: Yes    Alcohol/week: 4.0 standard drinks of alcohol    Types: 2 Glasses of wine, 2 Cans of beer per week    Comment: wine nightly   Drug use: No   Sexual activity: Yes    Birth control/protection: Surgical, Post-menopausal    Comment: BTL

## 2022-11-05 ENCOUNTER — Encounter: Payer: Self-pay | Admitting: Sports Medicine

## 2022-11-05 ENCOUNTER — Ambulatory Visit (INDEPENDENT_AMBULATORY_CARE_PROVIDER_SITE_OTHER): Payer: 59 | Admitting: Sports Medicine

## 2022-11-05 DIAGNOSIS — M25551 Pain in right hip: Secondary | ICD-10-CM

## 2022-11-05 DIAGNOSIS — M67959 Unspecified disorder of synovium and tendon, unspecified thigh: Secondary | ICD-10-CM | POA: Diagnosis not present

## 2022-11-05 NOTE — Progress Notes (Signed)
States first trial did ok, The second one left her sore She just got back from vacation where she did a lot of walking and the pain increased with that.

## 2022-11-05 NOTE — Progress Notes (Signed)
Samantha Becker - 67 y.o. female MRN [id number]  Date of birth: 1955-10-20  Office Visit Note: Visit Date: 11/05/2022 PCP: Inda Coke, PA Referred by: Inda Coke, [place]  Subjective: Chief Complaint  Patient presents with   Right Hip - Follow-up   HPI: Samantha Becker is a pleasant 67 y.o. female who presents today for follow-up of right lateral hip pain with partial tearing of the gluteus medius.  As a reminder, Samantha Becker states today that she has had lateral hip pain on and off for about 40 years or more.  However, this has been worse over the last 1-2 years.  She has seen my partner, Dr. Sammuel Hines, he did have a greater troches/gluteus medius injection that gave her excellent relief but only for 1 week.  She has tried formalized physical therapy without much relief.  She also does deep tissue massages which help to some extent.  Uses Advil for pain control as needed, only some benefit.    She did have some increased soreness after the second shockwave treatment, did not get as much improvement as the first 1 which gave her about 60% improvement in her pain.  She did do a lot more walking on her vacation which feels like exacerbated her pain.  Using Advil once or twice daily for her pain.  When she was on vacation she was not doing her strengthening exercises as much, but now that she is back she has continued back to doing these.  Pertinent ROS were reviewed with the patient and found to be negative unless otherwise specified above in HPI.   Assessment & Plan: Visit Diagnoses:  1. Tendinopathy of gluteus medius   2. Pain in right hip    Plan: Discussed with Samantha Becker that I do feel overall that extracorporeal shockwave treatment therapy was beneficial, she did have some increased soreness from the last treatment, but her first was rather impressively effective at reducing her pain.  We did repeat treatment today, as long as she is finding benefit we will continue once weekly  treatments.  I would like her to continue her home rehab and hip strengthening protocol.  If she is not getting continued benefit from shockwave treatment therapies, other options that we may consider may be nitroglycerin patch protocol, PRP injection therapy (especially given her transient improvement with steroid inj); can always consider short course of anti-inflammatory as well (Mobic, etc.). She will f/u in 1 week.  Follow-up: Return in about 1 week (around 11/12/2022) for for lateral hip pain.   Meds & Orders: No orders of the defined types were placed in this encounter.  No orders of the defined types were placed in this encounter.    Procedures: Procedure: ECSWT Indications: Gluteus medius tendinopathy; greater trochanteric pain syndrome   Procedure Details Consent: Risks of procedure as well as the alternatives and risks of each were explained to the patient.  Verbal consent for procedure obtained. Time Out: Verified patient identification, verified procedure, site was marked, verified correct patient position. The area was cleaned with alcohol swab.     The right greater trochanter and gluteus medius tendon was targeted for Extracorporeal shockwave therapy.    Preset: Greater Trochanteric Bursitis Power Level: 100 mJ Frequency: 10 Hz Impulse/cycles: 2200 Head size: Regular   Patient tolerated procedure well without immediate complications.      Clinical History: No specialty comments available.  She reports that she has never smoked. She has never used smokeless tobacco. No results for input(s): "HGBA1C", "  LABURIC" in the last 8760 hours.  Objective:   Vital Signs: LMP  (LMP Unknown)   Physical Exam  Gen: Well-appearing, in no acute distress; non-toxic CV: Well-perfused. Warm.  Resp: Breathing unlabored on room air; no wheezing. Psych: Fluid speech in conversation; appropriate affect; normal thought process Neuro: Sensation intact throughout. No gross coordination  deficits.   Ortho Exam - Right hip: No redness or swelling overlying.  There is positive TTP over the posterior lateral aspect of the greater trochanter as well as palpating down the gluteus medius/minimus tendons in the mid buttock.  No restriction to internal or external rotation.  There is 5/5 strength of the hip in all directions, no significant weakness with resisted hip abduction.  Imaging: No results found.  Past Medical/Family/Surgical/Social History: Medications & Allergies reviewed per EMR, new medications updated. Patient Active Problem List   Diagnosis Date Noted   Osteoarthritis of AC (acromioclavicular) joint    Endolymphatic hydrops of right ear 08/18/2020   Right-sided sensorineural hearing loss 08/18/2020   Atrophic vaginitis 03/20/2017   Endometriosis 03/18/2012   Dyspareunia 02/01/2012   Past Medical History:  Diagnosis Date   Arthritis    Chicken pox    GERD (gastroesophageal reflux disease)    OTC   Leaking of urine    Low iron    Mumps    Family History  Problem Relation Age of Onset   Heart disease Father    Hypertension Father    Cancer Father    Stroke Father    Heart disease Mother    Hypertension Mother    Arthritis Mother    Breast cancer Mother    Osteoporosis Mother    Arthritis Sister    Asthma Sister    Past Surgical History:  Procedure Laterality Date   CESAREAN SECTION     4 cs   LAPAROSCOPY  03/04/2012   Procedure: LAPAROSCOPY OPERATIVE;  Surgeon: Eldred Manges, MD;  Location: [facility];  Service: Gynecology;  Laterality: N/A;  Peritoneum Biopsy   SHOULDER ARTHROSCOPY Right 03/20/2022   Procedure: RIGHT SHOULDER ARTHROSCOPY WITH DISTAL CLAVICLE RESECTION;  Surgeon: Vanetta Mulders, MD;  Location: [facility];  Service: Orthopedics;  Laterality: Right;   TUBAL LIGATION Bilateral    Social History   Occupational History   Occupation: TEACHER     Employer: [employer]  Tobacco Use   Smoking status: Never    Smokeless tobacco: Never  Substance and Sexual Activity   Alcohol use: Yes    Alcohol/week: 4.0 standard drinks of alcohol    Types: 2 Glasses of wine, 2 Cans of beer per week    Comment: wine nightly   Drug use: No   Sexual activity: Yes    Birth control/protection: Surgical, Post-menopausal    Comment: BTL

## 2022-11-12 ENCOUNTER — Ambulatory Visit (INDEPENDENT_AMBULATORY_CARE_PROVIDER_SITE_OTHER): Payer: 59 | Admitting: Sports Medicine

## 2022-11-12 ENCOUNTER — Encounter: Payer: Self-pay | Admitting: Sports Medicine

## 2022-11-12 DIAGNOSIS — M67959 Unspecified disorder of synovium and tendon, unspecified thigh: Secondary | ICD-10-CM

## 2022-11-12 DIAGNOSIS — M25551 Pain in right hip: Secondary | ICD-10-CM

## 2022-11-12 MED ORDER — NITROGLYCERIN 0.2 MG/HR TD PT24: MEDICATED_PATCH | TRANSDERMAL | 12 refills | Status: DC

## 2022-11-12 NOTE — Patient Instructions (Signed)
Nitroglycerin Protocol  Apply 1/4th nitroglycerin patch to affected area daily. Change position of patch within the affected area every 24 hours. You may experience a headache during the first 1-2 weeks of using the patch, these should subside. If you experience headaches after beginning nitroglycerin patch treatment, you may take your preferred over the counter pain reliever. Another side effect of the nitroglycerin patch is skin irritation or rash related to patch adhesive. Please notify our office if you develop more severe headaches or rash, and stop the patch. Tendon healing with nitroglycerin patch may require 12 to 24 weeks depending on the extent of injury. Men should not use if taking Viagra, Cialis, or Levitra.  Do not use if you have migraines or rosacea.     Dr. Rolena Infante' Instructions and What to Expect for PRP Injections:  Platelet-rich plasma is used in musculoskeletal medicine to focus your own body's ability to heal. It has several well-done published randomized control trials (RCT) which demonstrate both its effectiveness and safety in many musculoskeletal conditions, including osteoarthritis, tendinopathies, and damaged vertebral discs. PRP has been in clinical use since the 1990's. Many people know that platelets form a clot if there is a cut in the skin. It turns out that platelets do not only form a clot, they also start the body's own repair process. When platelets activate to form a clot, they also release alpha granules which have hundreds of chemical messengers in them that initiate and organize repair to the damaged tissue. Precisely placing PRP into the site of injury will initiate the healing process by activating on the damaged cartilage or tendon. This is an inflammatory process, and inflammation is the vital first phase of Healing.  What to expect and how to prepare for PRP   2 weeks prior to the procedure: depending on the procedure, you may need to  arrange for a driver to bring you home. IF you are having a lower extremity procedure, we can provide crutches as needed, but most people do not need them.   10 days prior to the procedure: Stop taking anti-inflammatory drugs like Ibuprofen/Motrin, Advil, Naprosyn, Celebrex, or Meloxicam. Even aspirin should be stopped (but need to discuss this with Dr. Rolena Infante and your cardiologist beforehand). Let Dr. Rolena Infante know if you have been taking prednisone or other corticosteroids in the last month.   The day before the procedure: thoroughly shower and clean your skin.    The day of the procedure: Wear loose-fitting clothing like sweatpants or shorts. If you are having an upper body procedure wear a top that can button or zip up.  PRP will initiate healing and a productive inflammation, and PRP therapy will make the body part treated sore for 4 days to two weeks. Anti-inflammatory drugs (i.e. ibuprofen, Naprosyn, Celebrex) and corticosteroids such as prednisone can blunt or stop this process, so it is important to not take any anti-inflammatory drugs for 7 days before getting PRP therapy, or for at least three weeks after PRP therapy. Corticosteroid injections can blunt inflammation for 30 days, so let us know if you have had one recently. Depending on the body part injected, you may be in a sling or on crutches for several days. Just like wringing out a wet dishcloth, if you load or tense a tendon or ligament that has just been injected with PRP, some of the PRP injected will squish out. By keeping the body part treated relaxed by using a sling (for the shoulder or arm) or crutches (for  hips and legs) for a few days, the PRP can bind in place and do its job.   You may need a driver to bring you home.  Tobacco/nicotine is a potent toxin and its use constricts small blood vessels which are needed for tissue repair.  Tobacco/nicotine use will limit the effectiveness of any treatment and stopping  tobacco use is one of the single  greatest actions you can take to improve your health. Avoid toxins like alcohol, which inhibits and depresses the cells needed for tissue repair.  What happens during the PRP procedure?  Platelet rich plasma is made by taking some of your blood and performing a two-stage centrifuge process on it to concentrate the PRP. First, your blood is drawn into a syringe with a small amount of anti-coagulant in it (this is to keep the blood from clotting during this process). The amount of blood drawn is usually about 10-30 milliliters, depending on how much PRP is needed for the treatment.  (There are 355 milliliters in a 12-ounce soda can for comparison).  Then the blood is transferred in a sterile fashion into a centrifuge tube. It is then centrifuged for the first cycle where the red blood cells are isolated and discarded. In the second centrifuge cycle, the platelet-rich fraction of the remaining plasma is concentrated and placed in a syringe. The skin at the injection site is numbed with a small amount of topical cooling spray. Dr. Rolena Infante will then precisely inject the PRP into the injury site using ultrasound guidance.  What to do after your procedure  I will give you specific medicine to control any discomfort you may have after the procedure. Avoid NSAIDs like ibuprofen. Acetaminophen can be used for mild pain.  Depending on the part of the body treated, usually people have some soreness for the first 2-4 days. You need to really let this rest at that time. Do your best not to tense or load the treated area during this time. After 3 days, unless otherwise instructed, the treated body part should be used and slowly moved through its full range of motion. It will be sore, but you will not be doing damage by moving it, in fact it needs to move to heal. If you were on crutches for a period of time, walking is ok once you are off the crutches. For now, avoid  activities that specifically hurt you before being treated. Exercise is vital to good health and finding a way to cross train around your injury is important not only for your physical health, but for your mental health as well. Ask me about cross training options for your injury. Some brief (10 minutes or less) period of heat or ice therapy will not hurt the therapy, but it is not required. Usually, depending on the initial injury, physical therapy is started from two weeks to four weeks after injection. Improvements in pain and function should be expected from 8 weeks to 12 weeks after injection and some injuries may require more than one treatment.

## 2022-11-12 NOTE — Progress Notes (Signed)
Better than last visit; but does not feel like she has seen much improvement with this treatment

## 2022-11-12 NOTE — Progress Notes (Addendum)
Samantha Becker - 67 y.o. female MRN [id number]  Date of birth: 1956/04/04  Office Visit Note: Visit Date: 11/12/2022 PCP: Inda Coke, PA Referred by: Inda Coke, [place]  Subjective: Chief Complaint  Patient presents with   Right Hip - Follow-up   HPI: Samantha Becker is a pleasant 67 y.o. female who presents today for follow-up of right lateral hip pain with partial tearing of gluteus medius.  Dr. Sammuel Hines did did inject her at visit on 08/1022 over greater trochanter. She got 1 week of very significant relief. That being said her pain is recurred. Her MRIs are consistent with tearing of the undersurface of gluteus medius. Side effect we did discuss additional options such as shock therapy or PRP versus surgical repair.   She has had 3 treatments of ECSWT. At this point feels like she received good relief from the first treatment, but the last two her pain has returned to baseline. Not seeing significant improvement.  Pertinent ROS were reviewed with the patient and found to be negative unless otherwise specified above in HPI.   Assessment & Plan: Visit Diagnoses:  1. Tendinopathy of gluteus medius   2. Pain in right hip    Plan: Discussed with Samantha Becker all treatment options for her lateral hip.  Unfortunately, she did not get significant lasting effect from 3 sessions of extracorporeal shockwave therapy.  Her strength is quite well-preserved but she is still getting pain with prolonged standing, walking, yoga and other activities.  We discussed activity modification such as reducing or avoiding activities that we know will flareup her hip pain, although staying active with other exercise.  She will start aquatic [facility] at the [facility] this upcoming month.  To help aid in healing of her partial tear, we will start nitroglycerin patch protocol. Begin nitroglycerin 0.2mg /hr patch, every 24 hours.  We discussed risk/benefits/indications today.  I do not think repeating  corticosteroid injection will be significantly beneficial, I did discuss the role for PRP injection therapy -she will think about this and let me know if she wants to proceed with this.  This is where I would start for now, her other option would be considering seeing Dr. Sammuel Hines again for surgical referral.  Follow-up: Return if symptoms worsen or fail to improve.   Meds & Orders:  Meds ordered this encounter  Medications   nitroGLYCERIN (NITRODUR - DOSED IN MG/24 HR) 0.2 mg/hr patch    Sig: Cut the patch into fourths. Apply 1/4 patch over affected area on hip, change patch every 24 hours.    Dispense:  30 patch    Refill:  12   No orders of the defined types were placed in this encounter.    Procedures: N/a     Clinical History: No specialty comments available.  She reports that she has never smoked. She has never used smokeless tobacco. No results for input(s): "HGBA1C", "LABURIC" in the last 8760 hours.  Objective:   Vital Signs: LMP  (LMP Unknown)   Physical Exam  Gen: Well-appearing, in no acute distress; non-toxic CV: Regular Rate. Well-perfused. Warm.  Resp: Breathing unlabored on room air; no wheezing. Psych: Fluid speech in conversation; appropriate affect; normal thought process Neuro: Sensation intact throughout. No gross coordination deficits.   Ortho Exam - Right hip: No redness or swelling overlying the lateral hip. Positive TTP over the posterior lateral aspect of the greater trochanter.  There are no bony blocks to internal/external rotation.  5/5 strength of the hip in all directions,  there is no significant weakness with resisted hip abduction.  Imaging: MR HIP RIGHT WO CONTRAST CLINICAL DATA:  Chronic right hip pain. Tendon/bursal abnormality suspected.  EXAM: MR OF THE LEFT HIP WITHOUT CONTRAST  MR OF THE RIGHT HIP WITHOUT CONTRAST  TECHNIQUE: Multiplanar, multisequence MR imaging was performed of the left hip. Multiplanar, multisequence MR imaging  was performed of the right hip. No intravenous contrast was administered.  COMPARISON:  Pelvis and bilateral hip radiographs 08/29/2022  FINDINGS: Bones: No acute fracture or avascular necrosis is seen within the visualized portion of the pelvis or either proximal femur. Mild-to-moderate pubic symphysis joint space narrowing and peripheral osteophytosis.  Articular cartilage and labrum  Right hip:  Articular cartilage: Mild-to-moderate anterior superior right acetabular and femoral head cartilage thinning. Mild anterior superior right acetabular subchondral marrow edema.  Labrum: Lack of intra-articular fluid limits evaluation of the right acetabular labrum. No gross labral tear is seen.  Left hip:  Articular cartilage: Mild-to-moderate anterior superior left acetabular and femoral head cartilage thinning. Mild anterior superolateral left femoral head subchondral marrow edema (left hip coronal series 2, image 17).  Labrum: Lack of intra-articular fluid limits evaluation of the left acetabular labrum. No gross left acetabular labral tear is seen, although there is likely mild peripheral degenerative fraying of the anterior superior left acetabular labrum.  Joint or bursal effusion  Joint effusion:  No joint effusion within either hip.  Bursae:  No trochanteric bursitis on either side.  Muscles and tendons  Muscles and tendons: The origins of the bilateral rectus femoris tendons are intact. There is intermediate T2 signal indicating mild left and minimal right common hamstring origin tendinosis. No fluid bright tear or tendon retraction. The insertions of the bilateral gluteus minimus, gluteus medius, and iliopsoas tendons are intact.  Other findings  Miscellaneous: The rectus abdominis-adductor aponeuroses are intact.  The intrapelvic soft tissues are grossly unremarkable.  IMPRESSION: 1. Mild-to-moderate bilateral anterior superior acetabular and femoral head  cartilage thinning. 2. Mild-to-moderate pubic symphysis osteoarthritis. 3. Mild left and minimal right common hamstring origin tendinosis. No fluid bright tear or tendon retraction.  Electronically Signed   By: Yvonne Kendall M.D.   On: 10/01/2022 17:05 MR HIP LEFT WO CONTRAST CLINICAL DATA:  Chronic right hip pain. Tendon/bursal abnormality suspected.  EXAM: MR OF THE LEFT HIP WITHOUT CONTRAST  MR OF THE RIGHT HIP WITHOUT CONTRAST  TECHNIQUE: Multiplanar, multisequence MR imaging was performed of the left hip. Multiplanar, multisequence MR imaging was performed of the right hip. No intravenous contrast was administered.  COMPARISON:  Pelvis and bilateral hip radiographs 08/29/2022  FINDINGS: Bones: No acute fracture or avascular necrosis is seen within the visualized portion of the pelvis or either proximal femur. Mild-to-moderate pubic symphysis joint space narrowing and peripheral osteophytosis.  Articular cartilage and labrum  Right hip:  Articular cartilage: Mild-to-moderate anterior superior right acetabular and femoral head cartilage thinning. Mild anterior superior right acetabular subchondral marrow edema.  Labrum: Lack of intra-articular fluid limits evaluation of the right acetabular labrum. No gross labral tear is seen.  Left hip:  Articular cartilage: Mild-to-moderate anterior superior left acetabular and femoral head cartilage thinning. Mild anterior superolateral left femoral head subchondral marrow edema (left hip coronal series 2, image 17).  Labrum: Lack of intra-articular fluid limits evaluation of the left acetabular labrum. No gross left acetabular labral tear is seen, although there is likely mild peripheral degenerative fraying of the anterior superior left acetabular labrum.  Joint or bursal effusion  Joint effusion:  No  joint effusion within either hip.  Bursae:  No trochanteric bursitis on either side.  Muscles and tendons  Muscles  and tendons: The origins of the bilateral rectus femoris tendons are intact. There is intermediate T2 signal indicating mild left and minimal right common hamstring origin tendinosis. No fluid bright tear or tendon retraction. The insertions of the bilateral gluteus minimus, gluteus medius, and iliopsoas tendons are intact.  Other findings  Miscellaneous: The rectus abdominis-adductor aponeuroses are intact.  The intrapelvic soft tissues are grossly unremarkable.  IMPRESSION: 1. Mild-to-moderate bilateral anterior superior acetabular and femoral head cartilage thinning. 2. Mild-to-moderate pubic symphysis osteoarthritis. 3. Mild left and minimal right common hamstring origin tendinosis. No fluid bright tear or tendon retraction.  Electronically Signed   By: Yvonne Kendall M.D.   On: 10/01/2022 17:05    Past Medical/Family/Surgical/Social History: Medications & Allergies reviewed per EMR, new medications updated. Patient Active Problem List   Diagnosis Date Noted   Osteoarthritis of AC (acromioclavicular) joint    Endolymphatic hydrops of right ear 08/18/2020   Right-sided sensorineural hearing loss 08/18/2020   Atrophic vaginitis 03/20/2017   Endometriosis 03/18/2012   Dyspareunia 02/01/2012   Past Medical History:  Diagnosis Date   Arthritis    Chicken pox    GERD (gastroesophageal reflux disease)    OTC   Leaking of urine    Low iron    Mumps    Family History  Problem Relation Age of Onset   Heart disease Father    Hypertension Father    Cancer Father    Stroke Father    Heart disease Mother    Hypertension Mother    Arthritis Mother    Breast cancer Mother    Osteoporosis Mother    Arthritis Sister    Asthma Sister    Past Surgical History:  Procedure Laterality Date   CESAREAN SECTION     4 cs   LAPAROSCOPY  03/04/2012   Procedure: LAPAROSCOPY OPERATIVE;  Surgeon: Eldred Manges, MD;  Location: [facility];  Service: Gynecology;  Laterality: N/A;   Peritoneum Biopsy   SHOULDER ARTHROSCOPY Right 03/20/2022   Procedure: RIGHT SHOULDER ARTHROSCOPY WITH DISTAL CLAVICLE RESECTION;  Surgeon: Vanetta Mulders, MD;  Location: [place];  Service: Orthopedics;  Laterality: Right;   TUBAL LIGATION Bilateral    Social History   Occupational History   Occupation: TEACHER     Employer: [employer]  Tobacco Use   Smoking status: Never   Smokeless tobacco: Never  Substance and Sexual Activity   Alcohol use: Yes    Alcohol/week: 4.0 standard drinks of alcohol    Types: 2 Glasses of wine, 2 Cans of beer per week    Comment: wine nightly   Drug use: No   Sexual activity: Yes    Birth control/protection: Surgical, Post-menopausal    Comment: BTL   I spent 32 minutes in the care of the patient today including face-to-face time, preparation to see the patient, as well as review of hip MRI, review of previous notes from Dr. Sammuel Hines, counseling and educating the patient on home exercise plan, activity modification; discussion on PRP injection and information provided for the above diagnoses.   Elba Barman, DO Primary Care Sports Medicine Physician  [place]  This note was dictated using Dragon naturally speaking software and may contain errors in syntax, spelling, or content which have not been identified prior to signing this note.

## 2023-01-16 ENCOUNTER — Encounter: Payer: Self-pay | Admitting: Sports Medicine

## 2023-01-30 ENCOUNTER — Encounter: Payer: Self-pay | Admitting: Sports Medicine

## 2023-01-30 ENCOUNTER — Other Ambulatory Visit: Payer: Self-pay

## 2023-01-30 ENCOUNTER — Ambulatory Visit (INDEPENDENT_AMBULATORY_CARE_PROVIDER_SITE_OTHER): Payer: Medicare Other | Admitting: Sports Medicine

## 2023-01-30 DIAGNOSIS — M67959 Unspecified disorder of synovium and tendon, unspecified thigh: Secondary | ICD-10-CM | POA: Diagnosis not present

## 2023-01-30 DIAGNOSIS — M25551 Pain in right hip: Secondary | ICD-10-CM | POA: Diagnosis not present

## 2023-01-30 MED ORDER — BETAMETHASONE SOD PHOS & ACET 6 (3-3) MG/ML IJ SUSP
6.0000 mg | INTRAMUSCULAR | Status: AC | PRN
  Administered 2023-01-30: 6 mg via INTRA_ARTICULAR

## 2023-01-30 MED ORDER — LIDOCAINE HCL 1 % IJ SOLN
2.0000 mL | INTRAMUSCULAR | Status: AC | PRN
  Administered 2023-01-30: 2 mL

## 2023-01-30 MED ORDER — BUPIVACAINE HCL 0.25 % IJ SOLN
2.0000 mL | INTRAMUSCULAR | Status: AC | PRN
  Administered 2023-01-30: 2 mL via INTRA_ARTICULAR

## 2023-01-30 NOTE — Progress Notes (Signed)
Samantha Becker - 67 y.o. female MRN [id number]  Date of birth: 13-Sep-1955  Office Visit Note: Visit Date: 01/30/2023 PCP: Jarold Motto, PA Referred by: Jarold Motto, [place]  Subjective: Chief Complaint  Patient presents with   Right Hip - Pain   HPI: Samantha Becker is a pleasant 67 y.o. female who presents today for follow-up of right lateral hip pain.  Samantha Becker has a known undersurface tear of the gluteus medius.  She did get previous relief from a greater trochanteric injection back in January.  She has been treated with shockwave therapy as well as nitroglycerin patch protocol and rehab which is helping.  She is leaving for a trip to [place] here next week and would like to have further pain control and relief when she does a lot of walking for this trip.  Pertinent ROS were reviewed with the patient and found to be negative unless otherwise specified above in HPI.   Assessment & Plan: Visit Diagnoses:  1. Tendinopathy of gluteus medius   2. Pain in right hip    Plan: Discussed with Samantha Becker I am glad she is making improvements in her pain reduction in function for the hip and her gluteus medius.  Through shared decision making did elect to proceed with an ultrasound-guided greater trochanteric injection, after discussion with her as well we did use a sample of Zilretta to try to give her more extended relief of her pain reduction.  Use ice and over-the-counter anti-inflammatories for any postinjection pain.  After 48 hours I would like her to continue for hip abduction rehab series at home.  Discussed that at the 41-month mark which will be around 02/12/2023 she may discontinue the nitroglycerin patch.  She will follow-up with me as needed.  Follow-up: Return if symptoms worsen or fail to improve.   Meds & Orders: No orders of the defined types were placed in this encounter.   Orders Placed This Encounter  Procedures   Large Joint Inj   US Guided Needle Placement -  No Linked Charges     Procedures: Large Joint Inj: R greater trochanter on 01/30/2023 9:48 AM Indications: pain Details: 22 G 3.5 in needle, ultrasound-guided lateral approach Medications: 2 mL lidocaine 1 %; 2 mL bupivacaine 0.25 %; 6 mg betamethasone acetate-betamethasone sodium phosphate 6 (3-3) MG/ML (- Zilretta (triamcinolone acetonide extended-release injectable suspension) 32mg  (5mL)  ) Outcome: tolerated well, no immediate complications  US-Guided Greater Trochanteric Bursa Injection, right After discussion on risks/benefits/indications and informed verbal consent was obtained, a timeout was performed. The patient was lying in lateral recumbent position on exam table. Using ultrasound guidance, the greater trochanter was identified. The area overlying the trochanteric bursa was then prepped with Betadine and alcohol swabs. Following sterile precautions, ultrasound was reapplied to visualize needle guidance with a 22-gauge 3.5" needle utilizing an in-plane approach to inject the bursa with 2:2:1:5 lidocaine:bupivicaine:betamethasone:Zilretta. Delivery of the injectate was visualized into the region of hypoechoic fluid of the greater trochanteric bursa. Patient tolerated procedure well without immediate complications.    Procedure, treatment alternatives, risks and benefits explained, specific risks discussed. Consent was given by the patient. Immediately prior to procedure a time out was called to verify the correct patient, procedure, equipment, support staff and site/side marked as required. Patient was prepped and draped in the usual sterile fashion.          Clinical History: No specialty comments available.  She reports that she has never smoked. She has never used smokeless tobacco.  No results for input(s): "HGBA1C", "LABURIC" in the last 8760 hours.  Objective:   Vital Signs: LMP  (LMP Unknown)   Physical Exam  Gen: Well-appearing, in no acute distress; non-toxic CV:   Well-perfused. Warm.  Resp: Breathing unlabored on room air; no wheezing. Psych: Fluid speech in conversation; appropriate affect; normal thought process Neuro: Sensation intact throughout. No gross coordination deficits.   Ortho Exam -Right hip: + Mild TTP over the greater trochanteric region and the insertion of the gluteus medius and the posterior buttock.  No redness or swelling.  No internal or external block to rotation.  Imaging: No results found.  Past Medical/Family/Surgical/Social History: Medications & Allergies reviewed per EMR, new medications updated. Patient Active Problem List   Diagnosis Date Noted   Osteoarthritis of AC (acromioclavicular) joint    Endolymphatic hydrops of right ear 08/18/2020   Right-sided sensorineural hearing loss 08/18/2020   Atrophic vaginitis 03/20/2017   Endometriosis 03/18/2012   Dyspareunia 02/01/2012   Past Medical History:  Diagnosis Date   Arthritis    Chicken pox    GERD (gastroesophageal reflux disease)    OTC   Leaking of urine    Low iron    Mumps    Family History  Problem Relation Age of Onset   Heart disease Father    Hypertension Father    Cancer Father    Stroke Father    Heart disease Mother    Hypertension Mother    Arthritis Mother    Breast cancer Mother    Osteoporosis Mother    Arthritis Sister    Asthma Sister    Past Surgical History:  Procedure Laterality Date   CESAREAN SECTION     4 cs   LAPAROSCOPY  03/04/2012   Procedure: LAPAROSCOPY OPERATIVE;  Surgeon: Hal Morales, MD;  Location: [facility];  Service: Gynecology;  Laterality: N/A;  Peritoneum Biopsy   SHOULDER ARTHROSCOPY Right 03/20/2022   Procedure: RIGHT SHOULDER ARTHROSCOPY WITH DISTAL CLAVICLE RESECTION;  Surgeon: Huel Cote, MD;  Location: [facility];  Service: Orthopedics;  Laterality: Right;   TUBAL LIGATION Bilateral    Social History   Occupational History   Occupation: TEACHER     Employer: [employer]   Tobacco Use   Smoking status: Never   Smokeless tobacco: Never  Substance and Sexual Activity   Alcohol use: Yes    Alcohol/week: 4.0 standard drinks of alcohol    Types: 2 Glasses of wine, 2 Cans of beer per week    Comment: wine nightly   Drug use: No   Sexual activity: Yes    Birth control/protection: Surgical, Post-menopausal    Comment: BTL

## 2023-03-27 ENCOUNTER — Other Ambulatory Visit: Payer: Self-pay | Admitting: Physician Assistant

## 2023-03-27 DIAGNOSIS — Z1231 Encounter for screening mammogram for malignant neoplasm of breast: Secondary | ICD-10-CM

## 2023-03-28 ENCOUNTER — Ambulatory Visit (INDEPENDENT_AMBULATORY_CARE_PROVIDER_SITE_OTHER): Payer: Medicare Other | Admitting: Family

## 2023-03-28 VITALS — BP 155/82 | HR 61 | Temp 97.6°F | Ht 62.0 in | Wt 134.6 lb

## 2023-03-28 DIAGNOSIS — S80861A Insect bite (nonvenomous), right lower leg, initial encounter: Secondary | ICD-10-CM

## 2023-03-28 DIAGNOSIS — W57XXXA Bitten or stung by nonvenomous insect and other nonvenomous arthropods, initial encounter: Secondary | ICD-10-CM

## 2023-03-28 DIAGNOSIS — L089 Local infection of the skin and subcutaneous tissue, unspecified: Secondary | ICD-10-CM | POA: Diagnosis not present

## 2023-03-28 MED ORDER — DOXYCYCLINE HYCLATE 100 MG PO TABS: 100.0000 mg | ORAL_TABLET | Freq: Two times a day (BID) | ORAL | 0 refills | Status: AC

## 2023-03-28 NOTE — Progress Notes (Signed)
Patient ID: Samantha Becker, female    DOB: 1956/04/06, 67 y.o.   MRN: [id number]  Chief Complaint  Patient presents with   Insect Bite    Pt c/o bite on right calf, noticed Sunday morning. Bite is itchy and slightly painful.     HPI:      Insect bite:  right inside calf, unsure of insect, first noticed Sunday, very itchy, 2 small scabs with about 1 inch of redness around, she marked this area and has noticed the redness spread since then, also reports hardened area around the bite marks. Denies any neuropathy or shooting pain, no fever, headache or chills.  Assessment & Plan:  1. Insect bite of right lower leg, initial encounter 3.5cm area of erythema around center bite mark, w/warmth, itching, no induration, no splotchy or bull's eye appearance. Sending DOXY d/t local infection reaction, advised on use & SE.  - doxycycline (VIBRA-TABS) 100 MG tablet; Take 1 tablet (100 mg total) by mouth 2 (two) times daily for 7 days.  Dispense: 14 tablet; Refill: 0  2. Local skin infection sending DOXY d/t inflammation around bite, approx. 3.5cm in diameter, warm to touch, advised on use & SE.  - doxycycline (VIBRA-TABS) 100 MG tablet; Take 1 tablet (100 mg total) by mouth 2 (two) times daily for 7 days.  Dispense: 14 tablet; Refill: 0   Subjective:    Outpatient Medications Prior to Visit  Medication Sig Dispense Refill   conjugated estrogens (PREMARIN) vaginal cream Premarin 0.625 mg/gram vaginal cream  Insert 0.5 applicatorsful twice a week by vaginal route. 42.5 g 2   diclofenac Sodium (VOLTAREN) 1 % GEL Apply 2 g topically as needed.     fluticasone (FLONASE) 50 MCG/ACT nasal spray Place 1 spray into both nostrils as needed for allergies or rhinitis.     ibuprofen (ADVIL) 200 MG tablet Take 800 mg by mouth every 6 (six) hours as needed.     nitroGLYCERIN (NITRODUR - DOSED IN MG/24 HR) 0.2 mg/hr patch Cut the patch into fourths. Apply 1/4 patch over affected area on hip, change patch  every 24 hours. 30 patch 12   No facility-administered medications prior to visit.   Past Medical History:  Diagnosis Date   Arthritis    Chicken pox    GERD (gastroesophageal reflux disease)    OTC   Leaking of urine    Low iron    Mumps    Past Surgical History:  Procedure Laterality Date   CESAREAN SECTION     4 cs   LAPAROSCOPY  03/04/2012   Procedure: LAPAROSCOPY OPERATIVE;  Surgeon: Hal Morales, MD;  Location: [facility];  Service: Gynecology;  Laterality: N/A;  Peritoneum Biopsy   SHOULDER ARTHROSCOPY Right 03/20/2022   Procedure: RIGHT SHOULDER ARTHROSCOPY WITH DISTAL CLAVICLE RESECTION;  Surgeon: Huel Cote, MD;  Location: [facility];  Service: Orthopedics;  Laterality: Right;   TUBAL LIGATION Bilateral    Allergies  Allergen Reactions   Morphine Nausea Only      Objective:    Physical Exam Vitals and nursing note reviewed.  Constitutional:      Appearance: Normal appearance.  Cardiovascular:     Rate and Rhythm: Normal rate and regular rhythm.  Pulmonary:     Effort: Pulmonary effort is normal.     Breath sounds: Normal breath sounds.  Musculoskeletal:        General: Normal range of motion.  Skin:    General: Skin is warm and dry.  Findings: Rash (approx 3.5cm diameter area of erythema around pinpoint bite mark, warm to touch, right medial calf) present.  Neurological:     Mental Status: She is alert.  Psychiatric:        Mood and Affect: Mood normal.        Behavior: Behavior normal.    BP (!) 155/82 (BP Location: Left Arm, Patient Position: Sitting, Cuff Size: Normal)   Pulse 61   Temp 97.6 F (36.4 C) (Temporal)   Ht 5\' 2"  (1.575 m)   Wt 134 lb 9.6 oz (61.1 kg)   LMP  (LMP Unknown)   SpO2 98%   BMI 24.62 kg/m  Wt Readings from Last 3 Encounters:  03/28/23 134 lb 9.6 oz (61.1 kg)  09/17/22 131 lb (59.4 kg)  08/24/22 133 lb 9.6 oz (60.6 kg)      Dulce Sellar, NP

## 2023-04-02 ENCOUNTER — Ambulatory Visit: Payer: PRIVATE HEALTH INSURANCE | Admitting: Physician Assistant

## 2023-04-02 ENCOUNTER — Encounter: Payer: Self-pay | Admitting: Physician Assistant

## 2023-04-03 MED ORDER — TRIAMCINOLONE ACETONIDE 0.1 % EX CREA: TOPICAL_CREAM | CUTANEOUS | 0 refills | Status: DC

## 2023-04-03 NOTE — Telephone Encounter (Signed)
Pt requesting Rx for Triamcinolone cream, okay to fill?

## 2023-04-11 ENCOUNTER — Encounter (INDEPENDENT_AMBULATORY_CARE_PROVIDER_SITE_OTHER): Payer: Self-pay

## 2023-04-15 ENCOUNTER — Other Ambulatory Visit: Payer: Self-pay | Admitting: Oncology

## 2023-04-15 DIAGNOSIS — Z006 Encounter for examination for normal comparison and control in clinical research program: Secondary | ICD-10-CM

## 2023-04-24 ENCOUNTER — Other Ambulatory Visit: Payer: Medicare Other

## 2023-05-03 ENCOUNTER — Encounter (HOSPITAL_BASED_OUTPATIENT_CLINIC_OR_DEPARTMENT_OTHER): Payer: Self-pay | Admitting: Orthopaedic Surgery

## 2023-05-15 NOTE — Progress Notes (Addendum)
Chief Complaint:  Samantha Becker is a 67 year old female who presents today for a Medicare Initial Preventive Examination.  Assessment/Plan:  New/Acute Problems: None  Chronic Problems Addressed Today: HLD Currently not on any medication  Preventative Healthcare Health Maintenance  Topic Date Due   Cervical Cancer Screening (HPV/Pap Cotest)  01/09/2015   DEXA SCAN  Never done   Colonoscopy  02/17/2023   Zoster Vaccines- Shingrix (1 of 2) 09/18/2023 (Originally [birth year]/1977)   Pneumonia Vaccine 61+ Years old (1 of 1 - PCV) 05/21/2024 (Originally 02/13/2023)   COVID-19 Vaccine (6 - 2023-24 season) 05/21/2024 (Originally 04/28/2023)   MAMMOGRAM  05/09/2024   Medicare Annual Wellness (AWV)  05/21/2024   DTaP/Tdap/Td (3 - Td or Tdap) 06/13/2028   INFLUENZA VACCINE  Completed   HPV VACCINES  Aged Out   Hepatitis C Screening  Discontinued   HIV Screening  Discontinued    Patient Counseling (The following topics were reviewed and/or handout was given):  -Nutrition: Stressed importance of moderation in sodium/caffeine intake, saturated fat and cholesterol, caloric balance, sufficient intake of fresh fruits, vegetables, and fiber.  -Stressed the importance of regular exercise.   -Substance Abuse: Discussed cessation/primary prevention of tobacco, alcohol, or other drug use; driving or other dangerous activities under the influence; availability of treatment for abuse.   -Injury prevention: Discussed safety belts, safety helmets, smoke detector, smoking near bedding or upholstery.   -Sexuality: Discussed sexually transmitted diseases, partner selection, use of condoms, avoidance of unintended pregnancy and contraceptive alternatives.   -Dental health: Discussed importance of regular tooth brushing, flossing, and dental visits.  -Health maintenance and immunizations reviewed. Please refer to Health maintenance section.  Advance Directives were discussed with the patient.  I have  provided her with a copy to complete at her convenience.  Patient Instructions (the written plan) was given to the patient.  Medicare Attestation I have personally reviewed: The patient's medical and social history Their use of alcohol, tobacco or illicit drugs Their current medications and supplements The patient's functional ability including ADLs,fall risks, home safety risks, cognitive, and hearing and visual impairment Diet and physical activities Evidence for depression or mood disorders Her use of opioid medications, reviewed Opioid use disorder risk factors, evaluated pain severity and current treatment plan, and provided information on non-opioid treatment options  The patient's weight, height, BMI, and visual acuity have been recorded in the chart.  I have made referrals, counseling, and provided education to the patient based on review of the above and I have provided the patient with a written personalized care plan for preventive services.    During the course of the visit the patient was educated and counseled about appropriate screening and preventive services including:        Screening Pap smear and pelvic exam  Advanced directives: has NO advanced directive  - add't info requested. Referral to SW: no   Subjective:  HPI:  She is currently here for her Welcome To Medicare visit.  She is doing well overall. She continues to do water aerobics and eats very well on a regular basis -- she gets most food groups daily.   She is UpToDate on dermatology visits and gets yearly exams.  Wt Readings from Last 3 Encounters:  05/22/23 135 lb 6.1 oz (61.4 kg)  03/28/23 134 lb 9.6 oz (61.1 kg)  09/17/22 131 lb (59.4 kg)    Activities of Daily Living: He has no difficulty performing the following: Preparing food and eating Bathing  Getting dressed Using the toilet Shopping Managing Finances Moving around from place to place  Fall Screening: She has not had any falls  within the past year.   Hearing Screening: Patient has no concerns over hearing.  Safety Screening Patient feels safe at home.  There are no smokers at home.   Depression Screening:    05/22/2023    8:09 AM 08/24/2022    3:18 PM 04/25/2021    1:28 PM 08/13/2019    8:50 AM 03/03/2018    2:02 PM  Depression screen PHQ 2/9  Decreased Interest 0 0 0 0 0  Down, Depressed, Hopeless 0 0 0 0 0  PHQ - 2 Score 0 0 0 0 0  Altered sleeping  0     Tired, decreased energy  0     Change in appetite  0     Feeling bad or failure about yourself   0     Trouble concentrating  0     Moving slowly or fidgety/restless  0     Suicidal thoughts  0     PHQ-9 Score  0     Difficult doing work/chores  Not difficult at all       Lifestyle Factors: Diet: well balanced Exercise: water aerobics on a regular basis   Patient Care Team: Jarold Motto, [place] as PCP - General (Physician Assistant)   ROS: Per HPI  PMH:  The following were reviewed and entered/updated in epic: Past Medical History:  Diagnosis Date   Arthritis    Chicken pox    GERD (gastroesophageal reflux disease)    OTC   Leaking of urine    Low iron    Mumps    Past Surgical History:  Procedure Laterality Date   CESAREAN SECTION     4 cs   LAPAROSCOPY  03/04/2012   Procedure: LAPAROSCOPY OPERATIVE;  Surgeon: Hal Morales, MD;  Location: [facility];  Service: Gynecology;  Laterality: N/A;  Peritoneum Biopsy   SHOULDER ARTHROSCOPY Right 03/20/2022   Procedure: RIGHT SHOULDER ARTHROSCOPY WITH DISTAL CLAVICLE RESECTION;  Surgeon: Huel Cote, MD;  [facility];  Service: Orthopedics;  Laterality: Right;   TUBAL LIGATION Bilateral     Family History  Problem Relation Age of Onset   Heart disease Mother    Hypertension Mother    Arthritis Mother    Breast cancer Mother    Osteoporosis Mother    Heart failure Mother    Heart disease Father    Hypertension Father    Cancer Father    Stroke  Father    Arthritis Sister    Asthma Sister     Medications- reviewed and updated Current Outpatient Medications  Medication Sig Dispense Refill   Calcium Carb-Cholecalciferol (CALCIUM 500 + D PO) Take 2 each by mouth daily in the afternoon.     conjugated estrogens (PREMARIN) vaginal cream Premarin 0.625 mg/gram vaginal cream  Insert 0.5 applicatorsful twice a week by vaginal route. 42.5 g 2   diclofenac Sodium (VOLTAREN) 1 % GEL Apply 2 g topically as needed.     fluticasone (FLONASE) 50 MCG/ACT nasal spray Place 1 spray into both nostrils as needed for allergies or rhinitis.     ibuprofen (ADVIL) 200 MG tablet Take 800 mg by mouth every 6 (six) hours as needed.     triamcinolone cream (KENALOG) 0.1 % APPLY TO AFFECTED AREA 1-2 TIMES DAILY. 45 g 0   TURMERIC PO Take 3 tablets by mouth daily  in the afternoon.     No current facility-administered medications for this visit.     Allergies-reviewed and updated Allergies  Allergen Reactions   Morphine Nausea Only    Social History   Socioeconomic History   Marital status: Married    Spouse name: Not on file   Number of children: Not on file   Years of education: Not on file   Highest education level: Not on file  Occupational History   Occupation: TEACHER     Employer: [employer]  Tobacco Use   Smoking status: Never   Smokeless tobacco: Never  Substance and Sexual Activity   Alcohol use: Yes    Alcohol/week: 4.0 standard drinks of alcohol    Types: 2 Glasses of wine, 2 Cans of beer per week    Comment: wine 3-4 times a week   Drug use: No   Sexual activity: Yes    Birth control/protection: Surgical, Post-menopausal    Comment: BTL  Other Topics Concern   Not on file  Social History Narrative   Prior Runner, broadcasting/film/video at [employer], now does tutoring -- once a week   Husband and her lives in home   Social Determinants of Health   Financial Resource Strain: Not on file  Food Insecurity: Not on file  Transportation  Needs: Not on file  Physical Activity: Not on file  Stress: Not on file  Social Connections: Not on file    Objective/Observations  Physical Exam: Vitals:   05/22/23 0809  BP: 136/84  Pulse: 75  Temp: (!) 97.5 F (36.4 C)  SpO2: 96%    Vision screening / Hearing Screening   Hearing Screening  Method: Audiometry    Right ear  Left ear  Comments: Right ear: Passed Left ear: Passed  Vision Screening   Right eye Left eye Both eyes  Without correction     With correction 20/20 20/20 20/20     Gen: NAD, resting comfortably CV: RRR with no murmurs appreciated Pulm: NWOB, CTAB with no crackles, wheezes, or rhonchi GI: Normal bowel sounds present. Soft, Nontender, Nondistended. MSK: no edema, cyanosis, or clubbing noted Skin: warm, dry Gynecology: normal vagina, no discharge/lesions; no CMT; normal adnexa  External genitalia without lesions or erythema Urethral meatus without lesions or erythema Urethra appears normal without tenderness or discharge present Bladder without tenderness or fullness on palpation Vagina without lesions, discharge or tenderness; walls with pale mucosa; no obvious prolapse Cervix without discharge, lesions or erythema Uterus with appropriate size and position; no tenderness or obvious deformity [place] without tenderness to palpation or fullness Anus and perineum visually inspected without evidence of lesions or hemorrhoids Breasts: not performed - patient had mammogram yesterday Neuro: grossly normal, moves all extremities Psych: Normal affect and thought content. Normal minicog.      Jarold Motto PA-C

## 2023-05-21 ENCOUNTER — Ambulatory Visit
Admission: RE | Admit: 2023-05-21 | Discharge: 2023-05-21 | Disposition: A | Discharge status: Home / Self Care | Payer: Medicare Other | Source: Ambulatory Visit | Attending: Physician Assistant

## 2023-05-21 DIAGNOSIS — Z1231 Encounter for screening mammogram for malignant neoplasm of breast: Secondary | ICD-10-CM

## 2023-05-22 ENCOUNTER — Other Ambulatory Visit (HOSPITAL_COMMUNITY)
Admission: RE | Admit: 2023-05-22 | Discharge: 2023-05-22 | Disposition: A | Discharge status: Home / Self Care | Payer: Medicare Other | Source: Ambulatory Visit | Attending: Physician Assistant | Admitting: Physician Assistant

## 2023-05-22 ENCOUNTER — Ambulatory Visit (INDEPENDENT_AMBULATORY_CARE_PROVIDER_SITE_OTHER): Payer: Medicare Other | Admitting: Physician Assistant

## 2023-05-22 ENCOUNTER — Ambulatory Visit (HOSPITAL_BASED_OUTPATIENT_CLINIC_OR_DEPARTMENT_OTHER): Payer: Medicare Other

## 2023-05-22 ENCOUNTER — Ambulatory Visit (HOSPITAL_BASED_OUTPATIENT_CLINIC_OR_DEPARTMENT_OTHER): Payer: Medicare Other | Admitting: Orthopaedic Surgery

## 2023-05-22 ENCOUNTER — Encounter: Payer: Self-pay | Admitting: Physician Assistant

## 2023-05-22 VITALS — BP 136/84 | HR 75 | Temp 97.5°F | Ht 62.0 in | Wt 135.4 lb

## 2023-05-22 DIAGNOSIS — Z124 Encounter for screening for malignant neoplasm of cervix: Secondary | ICD-10-CM | POA: Diagnosis not present

## 2023-05-22 DIAGNOSIS — Z1151 Encounter for screening for human papillomavirus (HPV): Secondary | ICD-10-CM | POA: Insufficient documentation

## 2023-05-22 DIAGNOSIS — E782 Mixed hyperlipidemia: Secondary | ICD-10-CM

## 2023-05-22 DIAGNOSIS — Z0000 Encounter for general adult medical examination without abnormal findings: Secondary | ICD-10-CM | POA: Diagnosis not present

## 2023-05-22 DIAGNOSIS — Z23 Encounter for immunization: Secondary | ICD-10-CM

## 2023-05-22 DIAGNOSIS — Z01419 Encounter for gynecological examination (general) (routine) without abnormal findings: Secondary | ICD-10-CM | POA: Diagnosis present

## 2023-05-22 DIAGNOSIS — M19012 Primary osteoarthritis, left shoulder: Secondary | ICD-10-CM | POA: Diagnosis not present

## 2023-05-22 DIAGNOSIS — E2839 Other primary ovarian failure: Secondary | ICD-10-CM | POA: Diagnosis not present

## 2023-05-22 DIAGNOSIS — Z0110 Encounter for examination of ears and hearing without abnormal findings: Secondary | ICD-10-CM

## 2023-05-22 DIAGNOSIS — Z0100 Encounter for examination of eyes and vision without abnormal findings: Secondary | ICD-10-CM

## 2023-05-22 DIAGNOSIS — Z1211 Encounter for screening for malignant neoplasm of colon: Secondary | ICD-10-CM

## 2023-05-22 DIAGNOSIS — M19019 Primary osteoarthritis, unspecified shoulder: Secondary | ICD-10-CM

## 2023-05-22 DIAGNOSIS — M25512 Pain in left shoulder: Secondary | ICD-10-CM

## 2023-05-22 LAB — CBC WITH DIFFERENTIAL/PLATELET
Basophils Absolute: 0 10*3/uL (ref 0.0–0.1)
Basophils Relative: 0.6 % (ref 0.0–3.0)
Eosinophils Absolute: 0.1 10*3/uL (ref 0.0–0.7)
Eosinophils Relative: 1.4 % (ref 0.0–5.0)
HCT: 40.1 % (ref 36.0–46.0)
Hemoglobin: 13.3 g/dL (ref 12.0–15.0)
Lymphocytes Relative: 25.1 % (ref 12.0–46.0)
Lymphs Abs: 1.6 10*3/uL (ref 0.7–4.0)
MCHC: 33.2 g/dL (ref 30.0–36.0)
MCV: 87.8 fl (ref 78.0–100.0)
Monocytes Absolute: 0.6 10*3/uL (ref 0.1–1.0)
Monocytes Relative: 10.2 % (ref 3.0–12.0)
Neutro Abs: 3.9 10*3/uL (ref 1.4–7.7)
Neutrophils Relative %: 62.7 % (ref 43.0–77.0)
Platelets: 325 10*3/uL (ref 150.0–400.0)
RBC: 4.57 Mil/uL (ref 3.87–5.11)
RDW: 13.2 % (ref 11.5–15.5)
WBC: 6.3 10*3/uL (ref 4.0–10.5)

## 2023-05-22 LAB — COMPREHENSIVE METABOLIC PANEL
ALT: 17 U/L (ref 0–35)
AST: 15 U/L (ref 0–37)
Albumin: 4.4 g/dL (ref 3.5–5.2)
Alkaline Phosphatase: 63 U/L (ref 39–117)
BUN: 13 mg/dL (ref 6–23)
CO2: 28 mEq/L (ref 19–32)
Calcium: 9.2 mg/dL (ref 8.4–10.5)
Chloride: 102 mEq/L (ref 96–112)
Creatinine, Ser: 0.77 mg/dL (ref 0.40–1.20)
GFR: 81.04 mL/min (ref >60.00)
Glucose, Bld: 90 mg/dL (ref 70–99)
Potassium: 3.9 mEq/L (ref 3.5–5.1)
Sodium: 137 mEq/L (ref 135–145)
Total Bilirubin: 0.4 mg/dL (ref 0.2–1.2)
Total Protein: 7 g/dL (ref 6.0–8.3)

## 2023-05-22 LAB — LIPID PANEL
Cholesterol: 206 mg/dL — ABNORMAL HIGH (ref 0–200)
HDL: 46.3 mg/dL (ref >39.00)
LDL Cholesterol: 134 mg/dL — ABNORMAL HIGH (ref 0–99)
NonHDL: 160.16
Total CHOL/HDL Ratio: 4
Triglycerides: 132 mg/dL (ref 0.0–149.0)
VLDL: 26.4 mg/dL (ref 0.0–40.0)

## 2023-05-22 MED ORDER — LIDOCAINE HCL 1 % IJ SOLN
4.0000 mL | INTRAMUSCULAR | Status: AC | PRN
  Administered 2023-05-22: 4 mL

## 2023-05-22 MED ORDER — TRIAMCINOLONE ACETONIDE 40 MG/ML IJ SUSP
80.0000 mg | INTRAMUSCULAR | Status: AC | PRN
  Administered 2023-05-22: 80 mg via INTRA_ARTICULAR

## 2023-05-22 NOTE — Patient Instructions (Signed)
It was great to see you!  I have ordered [facility] when you do your next mammogram I have ordered your Colonoscopy at [facility] -- they will contact you  Please try to get your Advanced Directives completed  Please go to the lab for blood work.   Our office will call you with your results unless you have chosen to receive results via MyChart.  If your blood work is normal we will follow-up each year for physicals and as scheduled for chronic medical problems.  If anything is abnormal we will treat accordingly and get you in for a follow-up.  Take care,  Lelon Mast

## 2023-05-22 NOTE — Progress Notes (Signed)
Chief Complaint: Left shoulder pain     History of Present Illness:   05/22/2023: Presents today for follow-up of her left shoulder.  She is persistently experiencing pain over the [facility] joint similar to the right shoulder.  She has not had any injections in the shoulder.  She is here today for further discussion  Samantha Becker is a 67 y.o. female presents today with right worse than left hip pain which has been going on now for approximately a year.  She has previously undergone physical therapy for strengthening of both hips as well as balance although this only provided a limited period of relief.  At this time she is having a difficult time doing basic activities like walking or yoga.  She states that she is having a hard time laying directly on the right side and this wakes her up at night.  There is no radiation.  There is weakness.    Surgical History:   none  PMH/PSH/Family History/Social History/Meds/Allergies:    Past Medical History:  Diagnosis Date  . Arthritis   . Chicken pox   . GERD (gastroesophageal reflux disease)    OTC  . Leaking of urine   . Low iron   . Mumps    Past Surgical History:  Procedure Laterality Date  . CESAREAN SECTION     4 cs  . LAPAROSCOPY  03/04/2012   Procedure: LAPAROSCOPY OPERATIVE;  Surgeon: Hal Morales, MD;  Location: [facility];  Service: Gynecology;  Laterality: N/A;  Peritoneum Biopsy  . SHOULDER ARTHROSCOPY Right 03/20/2022   Procedure: RIGHT SHOULDER ARTHROSCOPY WITH DISTAL CLAVICLE RESECTION;  Surgeon: Huel Cote, MD;  Location: [facility];  Service: Orthopedics;  Laterality: Right;  . TUBAL LIGATION Bilateral    Social History   Socioeconomic History  . Marital status: Married    Spouse name: Not on file  . Number of children: Not on file  . Years of education: Not on file  . Highest education level: Not on file  Occupational History  . Occupation: TEACHER      Employer: [employer]  Tobacco Use  . Smoking status: Never  . Smokeless tobacco: Never  Substance and Sexual Activity  . Alcohol use: Yes    Alcohol/week: 4.0 standard drinks of alcohol    Types: 2 Glasses of wine, 2 Cans of beer per week    Comment: wine 3-4 times a week  . Drug use: No  . Sexual activity: Yes    Birth control/protection: Surgical, Post-menopausal    Comment: BTL  Other Topics Concern  . Not on file  Social History Narrative   Prior Runner, broadcasting/film/video at [employer], now does tutoring -- once a week   Husband and her lives in home   Social Determinants of Health   Financial Resource Strain: Not on file  Food Insecurity: Not on file  Transportation Needs: Not on file  Physical Activity: Not on file  Stress: Not on file  Social Connections: Not on file   Family History  Problem Relation Age of Onset  . Heart disease Mother   . Hypertension Mother   . Arthritis Mother   . Breast cancer Mother   . Osteoporosis Mother   . Heart failure Mother   . Heart disease Father   . Hypertension Father   .  Cancer Father   . Stroke Father   . Arthritis Sister   . Asthma Sister    Allergies  Allergen Reactions  . Morphine Nausea Only   Current Outpatient Medications  Medication Sig Dispense Refill  . Calcium Carb-Cholecalciferol (CALCIUM 500 + D PO) Take 2 each by mouth daily in the afternoon.    . conjugated estrogens (PREMARIN) vaginal cream Premarin 0.625 mg/gram vaginal cream  Insert 0.5 applicatorsful twice a week by vaginal route. 42.5 g 2  . diclofenac Sodium (VOLTAREN) 1 % GEL Apply 2 g topically as needed.    . fluticasone (FLONASE) 50 MCG/ACT nasal spray Place 1 spray into both nostrils as needed for allergies or rhinitis.    Marland Kitchen ibuprofen (ADVIL) 200 MG tablet Take 800 mg by mouth every 6 (six) hours as needed.    . triamcinolone cream (KENALOG) 0.1 % APPLY TO AFFECTED AREA 1-2 TIMES DAILY. 45 g 0  . TURMERIC PO Take 3 tablets by mouth daily in the afternoon.      No current facility-administered medications for this visit.   No results found.  Review of Systems:   A ROS was performed including pertinent positives and negatives as documented in the HPI.  Physical Exam :   Constitutional: NAD and appears stated age Neurological: Alert and oriented Psych: Appropriate affect and cooperative There were no vitals taken for this visit.   Comprehensive Musculoskeletal Exam:    Musculoskeletal Exam    Inspection Right Left  Skin No atrophy or winging No atrophy or winging  Palpation    Tenderness None AC joint  Range of Motion    Flexion (passive) 170 170  Flexion (active) 170 170  Abduction 170 170  ER at the side 70 70  Can reach behind back to T12 T12  Strength     Full Full  Special Tests    Pseudoparalytic No No  Neurologic    Fires PIN, radial, median, ulnar, musculocutaneous, axillary, suprascapular, long thoracic, and spinal accessory innervated muscles. No abnormal sensibility  Vascular/Lymphatic    Radial Pulse 2+ 2+  Cervical Exam    Patient has symmetric cervical range of motion with negative Spurling's test.  Special Test:      Imaging:   Xray (3 views left shoulder): Significant AC joint arthritis    Assessment:   67 y.o. female with left hip AC joint pain consistent with AC joint arthritis.  Today's visit I did recommend an ultrasound-guided injection.  Ultimately given the fact that she did require surgical debridement of the distal clavicle previously I do believe that she may ultimately require on the side as well.  We will plan to begin with an ultrasound-guided injection.  I would also like to obtain an MRI to rule out any underlying pathology that may need to be intervened upon as well.  Plan :    -Left ultrasound-guided AC injection after verbal consent obtained    Procedure Note  Patient: Samantha Becker             Date of Birth: 05-Dec-1955           MRN: [id number]             Visit Date:  05/22/2023  Procedures: Visit Diagnoses:  1. Osteoarthritis of AC (acromioclavicular) joint     Large Joint Inj on 05/22/2023 5:17 PM Indications: pain Details: 22 G 1.5 in needle, ultrasound-guided anterior approach  Arthrogram: No  Medications: 4 mL lidocaine 1 %; 80 mg triamcinolone  acetonide 40 MG/ML Outcome: tolerated well, no immediate complications Procedure, treatment alternatives, risks and benefits explained, specific risks discussed. Consent was given by the patient. Immediately prior to procedure a time out was called to verify the correct patient, procedure, equipment, support staff and site/side marked as required. Patient was prepped and draped in the usual sterile fashion.           I personally saw and evaluated the patient, and participated in the management and treatment plan.  Huel Cote, MD Attending Physician, Orthopedic Surgery  This document was dictated using Dragon voice recognition software. A reasonable attempt at proof reading has been made to minimize errors.

## 2023-05-23 ENCOUNTER — Encounter: Payer: Self-pay | Admitting: Physician Assistant

## 2023-05-23 ENCOUNTER — Encounter (HOSPITAL_BASED_OUTPATIENT_CLINIC_OR_DEPARTMENT_OTHER): Payer: Self-pay | Admitting: Orthopaedic Surgery

## 2023-05-24 ENCOUNTER — Ambulatory Visit
Admission: RE | Admit: 2023-05-24 | Discharge: 2023-05-24 | Disposition: A | Discharge status: Home / Self Care | Payer: Medicare Other | Source: Ambulatory Visit | Attending: Orthopaedic Surgery

## 2023-05-24 DIAGNOSIS — M19019 Primary osteoarthritis, unspecified shoulder: Secondary | ICD-10-CM

## 2023-05-27 ENCOUNTER — Other Ambulatory Visit: Payer: Self-pay | Admitting: Physician Assistant

## 2023-05-27 ENCOUNTER — Ambulatory Visit (HOSPITAL_BASED_OUTPATIENT_CLINIC_OR_DEPARTMENT_OTHER): Payer: Medicare Other | Admitting: Orthopaedic Surgery

## 2023-05-27 DIAGNOSIS — Z1231 Encounter for screening mammogram for malignant neoplasm of breast: Secondary | ICD-10-CM

## 2023-05-27 DIAGNOSIS — E2839 Other primary ovarian failure: Secondary | ICD-10-CM

## 2023-05-28 LAB — CYTOLOGY - PAP
Adequacy: ABSENT
Comment: NEGATIVE
Diagnosis: NEGATIVE
High risk HPV: NEGATIVE

## 2023-06-05 ENCOUNTER — Ambulatory Visit (HOSPITAL_BASED_OUTPATIENT_CLINIC_OR_DEPARTMENT_OTHER): Payer: Medicare Other | Admitting: Orthopaedic Surgery

## 2023-06-05 DIAGNOSIS — M19019 Primary osteoarthritis, unspecified shoulder: Secondary | ICD-10-CM | POA: Diagnosis not present

## 2023-06-05 NOTE — Progress Notes (Signed)
Chief Complaint: Left shoulder pain     History of Present Illness:   06/05/2023: Presents today for follow-up of her left shoulder MRI.  She is having extremely good relief from her [facility] injection   Samantha Becker is a 67 y.o. female presents today with right worse than left hip pain which has been going on now for approximately a year.  She has previously undergone physical therapy for strengthening of both hips as well as balance although this only provided a limited period of relief.  At this time she is having a difficult time doing basic activities like walking or yoga.  She states that she is having a hard time laying directly on the right side and this wakes her up at night.  There is no radiation.  There is weakness.    Surgical History:   none  PMH/PSH/Family History/Social History/Meds/Allergies:    Past Medical History:  Diagnosis Date   Arthritis    Chicken pox    GERD (gastroesophageal reflux disease)    OTC   Leaking of urine    Low iron    Mumps    Past Surgical History:  Procedure Laterality Date   CESAREAN SECTION     4 cs   LAPAROSCOPY  03/04/2012   Procedure: LAPAROSCOPY OPERATIVE;  Surgeon: Hal Morales, MD;  Location: [facility];  Service: Gynecology;  Laterality: N/A;  Peritoneum Biopsy   SHOULDER ARTHROSCOPY Right 03/20/2022   Procedure: RIGHT SHOULDER ARTHROSCOPY WITH DISTAL CLAVICLE RESECTION;  Surgeon: Huel Cote, MD;  Location: [facility];  Service: Orthopedics;  Laterality: Right;   TUBAL LIGATION Bilateral    Social History   Socioeconomic History   Marital status: Married    Spouse name: Not on file   Number of children: Not on file   Years of education: Not on file   Highest education level: Not on file  Occupational History   Occupation: TEACHER     Employer: [employer]  Tobacco Use   Smoking status: Never   Smokeless tobacco: Never  Substance and Sexual Activity    Alcohol use: Yes    Alcohol/week: 4.0 standard drinks of alcohol    Types: 2 Glasses of wine, 2 Cans of beer per week    Comment: wine 3-4 times a week   Drug use: No   Sexual activity: Yes    Birth control/protection: Surgical, Post-menopausal    Comment: BTL  Other Topics Concern   Not on file  Social History Narrative   Prior Runner, broadcasting/film/video at [employer], now does tutoring -- once a week   Husband and her lives in home   Social Determinants of Health   Financial Resource Strain: Not on file  Food Insecurity: Not on file  Transportation Needs: Not on file  Physical Activity: Not on file  Stress: Not on file  Social Connections: Not on file   Family History  Problem Relation Age of Onset   Heart disease Mother    Hypertension Mother    Arthritis Mother    Breast cancer Mother    Osteoporosis Mother    Heart failure Mother    Heart disease Father    Hypertension Father    Cancer Father    Stroke Father    Arthritis Sister    Asthma Sister  Allergies  Allergen Reactions   Morphine Nausea Only   Current Outpatient Medications  Medication Sig Dispense Refill   Calcium Carb-Cholecalciferol (CALCIUM 500 + D PO) Take 2 each by mouth daily in the afternoon.     conjugated estrogens (PREMARIN) vaginal cream Premarin 0.625 mg/gram vaginal cream  Insert 0.5 applicatorsful twice a week by vaginal route. 42.5 g 2   diclofenac Sodium (VOLTAREN) 1 % GEL Apply 2 g topically as needed.     fluticasone (FLONASE) 50 MCG/ACT nasal spray Place 1 spray into both nostrils as needed for allergies or rhinitis.     ibuprofen (ADVIL) 200 MG tablet Take 800 mg by mouth every 6 (six) hours as needed.     triamcinolone cream (KENALOG) 0.1 % APPLY TO AFFECTED AREA 1-2 TIMES DAILY. 45 g 0   TURMERIC PO Take 3 tablets by mouth daily in the afternoon.     No current facility-administered medications for this visit.   No results found.  Review of Systems:   A ROS was performed including  pertinent positives and negatives as documented in the HPI.  Physical Exam :   Constitutional: NAD and appears stated age Neurological: Alert and oriented Psych: Appropriate affect and cooperative There were no vitals taken for this visit.   Comprehensive Musculoskeletal Exam:    Musculoskeletal Exam    Inspection Right Left  Skin No atrophy or winging No atrophy or winging  Palpation    Tenderness None AC joint  Range of Motion    Flexion (passive) 170 170  Flexion (active) 170 170  Abduction 170 170  ER at the side 70 70  Can reach behind back to T12 T12  Strength     Full Full  Special Tests    Pseudoparalytic No No  Neurologic    Fires PIN, radial, median, ulnar, musculocutaneous, axillary, suprascapular, long thoracic, and spinal accessory innervated muscles. No abnormal sensibility  Vascular/Lymphatic    Radial Pulse 2+ 2+  Cervical Exam    Patient has symmetric cervical range of motion with negative Spurling's test.  Special Test:      Imaging:   Xray (3 views left shoulder): Significant AC joint arthritis  MRI left hip: No evidence of rotator cuff pathology.  Significant AC joint arthropathy  Assessment:   67 y.o. female with left hip AC joint pain consistent with AC joint arthritis.   At today's visit she did still have extremely good relief from her injection.  I did describe that should this wear off we would consider arthroscopic intervention and debridement.  Will plan to hold off on this for now.  I will plan to see her back as needed.  She will send [place] a message should this wear off.  Plan :    -Left ultrasound-guided AC injection after verbal consent obtained       I personally saw and evaluated the patient, and participated in the management and treatment plan.  Huel Cote, MD Attending Physician, Orthopedic Surgery  This document was dictated using Dragon voice recognition software. A reasonable attempt at proof reading has been made to  minimize errors.

## 2023-06-18 ENCOUNTER — Ambulatory Visit (AMBULATORY_SURGERY_CENTER): Payer: Medicare Other

## 2023-06-18 VITALS — Ht 62.0 in | Wt 132.0 lb

## 2023-06-18 DIAGNOSIS — Z1211 Encounter for screening for malignant neoplasm of colon: Secondary | ICD-10-CM

## 2023-06-18 MED ORDER — NA SULFATE-K SULFATE-MG SULF 17.5-3.13-1.6 GM/177ML PO SOLN: 1.0000 | Freq: Once | ORAL | 0 refills | Status: AC

## 2023-06-18 NOTE — Progress Notes (Signed)
No egg or soy allergy known to patient  No issues known to pt with past sedation with any surgeries or procedures: PONV with general does fine with propofol infusion  Patient denies ever being told they had issues or difficulty with intubation  No FH of Malignant Hyperthermia Pt is not on diet pills Pt is not on  home 02  Pt is not on blood thinners  Pt denies issues with constipation  No A fib or A flutter Have any cardiac testing pending--no  LOA: independent  Prep: Suprep  Patient's chart reviewed by Cathlyn Parsons CNRA prior to previsit and patient appropriate for the [facility].  Previsit completed and red dot placed by patient's name on their procedure day (on provider's schedule).     PV competed with patient. Prep instructions sent via mychart and home address. Goodrx coupon for [facility] provided to use for price reduction if needed.

## 2023-07-02 ENCOUNTER — Other Ambulatory Visit (HOSPITAL_COMMUNITY): Payer: Medicare Other

## 2023-07-11 ENCOUNTER — Other Ambulatory Visit (HOSPITAL_COMMUNITY)
Admission: RE | Admit: 2023-07-11 | Discharge: 2023-07-11 | Disposition: A | Discharge status: Home / Self Care | Payer: Medicare Other | Source: Ambulatory Visit | Attending: Oncology | Admitting: Oncology

## 2023-07-11 DIAGNOSIS — Z006 Encounter for examination for normal comparison and control in clinical research program: Secondary | ICD-10-CM | POA: Insufficient documentation

## 2023-07-17 ENCOUNTER — Telehealth: Payer: Self-pay | Admitting: Physician Assistant

## 2023-07-17 ENCOUNTER — Other Ambulatory Visit (HOSPITAL_BASED_OUTPATIENT_CLINIC_OR_DEPARTMENT_OTHER): Payer: Self-pay

## 2023-07-17 ENCOUNTER — Encounter (HOSPITAL_BASED_OUTPATIENT_CLINIC_OR_DEPARTMENT_OTHER): Payer: Self-pay | Admitting: Orthopaedic Surgery

## 2023-07-17 ENCOUNTER — Encounter (HOSPITAL_BASED_OUTPATIENT_CLINIC_OR_DEPARTMENT_OTHER): Payer: Self-pay | Admitting: *Deleted

## 2023-07-17 ENCOUNTER — Encounter: Payer: Self-pay | Admitting: Gastroenterology

## 2023-07-17 ENCOUNTER — Other Ambulatory Visit: Payer: Self-pay

## 2023-07-17 ENCOUNTER — Emergency Department (HOSPITAL_BASED_OUTPATIENT_CLINIC_OR_DEPARTMENT_OTHER)
Admission: EM | Admit: 2023-07-17 | Discharge: 2023-07-17 | Disposition: A | Discharge status: Home / Self Care | Payer: Medicare Other | Attending: Emergency Medicine | Admitting: Emergency Medicine

## 2023-07-17 DIAGNOSIS — Z79899 Other long term (current) drug therapy: Secondary | ICD-10-CM | POA: Diagnosis not present

## 2023-07-17 DIAGNOSIS — I10 Essential (primary) hypertension: Secondary | ICD-10-CM | POA: Diagnosis not present

## 2023-07-17 DIAGNOSIS — R030 Elevated blood-pressure reading, without diagnosis of hypertension: Secondary | ICD-10-CM | POA: Diagnosis present

## 2023-07-17 MED ORDER — VALSARTAN 80 MG PO TABS
80.0000 mg | ORAL_TABLET | Freq: Every day | ORAL | 0 refills | Status: DC
  Filled 2023-07-17: qty 15, 15d supply, fill #0

## 2023-07-17 NOTE — Discharge Instructions (Addendum)
Your elevated blood pressure may be due to recent increased usage of anti-inflammatory medication such as Motrin.  These medication unknown to cause a rise in once blood pressure.  Although I have prescribed you antihypertensive medication, you may not need to take it unless your elevated blood pressure persist despite the regular use of anti-inflammatory medication.  Please consider follow-up closely with your primary care doctor for recheck of your blood pressure.  ----  Thank you for the opportunity to take care of you in our Emergency Department. You have been diagnosed with high blood pressure, also known as hypertension. This means that the force of blood against the walls of your blood vessels called is too strong. It also means that your heart has to work harder to move the blood. High blood pressure usually has no symptoms, but over time, it can cause serious health problems such as Heart attack and heart failure Stroke Kidney disease and failure Vision loss With the help from your healthcare provider and some important life style changes, you can manage your blood pressure and protect your health. Please read the instructions provided on hypertension, how to manage it and how to check your blood pressure. Additionally, use the blood pressure log provided to record your blood pressures. Take the blood pressure log with you to your primary care doctor so that they can adjust your blood pressure medications if needed. Please read the instructions on follow-up appointment. Return to the ER or Call 911 right away if you have any of these symptoms: Chest pain or shortness of breath Severe headache Weakness, tingling, or numbness of your face, arms, or legs (especially on 1 side of the body) Sudden change in vision Confusion, trouble speaking, or trouble understanding speech

## 2023-07-17 NOTE — ED Triage Notes (Signed)
Pt with no hx of HTN checked her BP yesterday at home and noted that it was elevated, this am it was still elevated 169/99.  No CP or sob but has had a mild HA

## 2023-07-17 NOTE — Telephone Encounter (Signed)
FYI: This call has been transferred to triage nurse: the Triage Nurse. Once the result note has been entered staff can address the message at that time.  Patient called in with the following symptoms:  Red Word:elevated blood pressure-07/17/23 169/99, irregular heart beat, tension headache   Please advise at Mobile [phone] (mobile)  Message is routed to Provider Pool.

## 2023-07-17 NOTE — Telephone Encounter (Signed)
Patient Advised: Go to ED Now  Patient Name First: Samantha Last: Avelino Becker Gender: Female DOB: September 29, 1955 Age: 67 Y 5 M 1 D Return Phone Number: [phone] (Primary) Address: City/ State/ Zip: [place]  [address] Client [facility] - Administrator, [facility] at [facility] Provider Bufford Buttner, [place]- [place] Contact Type Call Who Is Calling Patient / Member / Family / Caregiver Call Type Triage / Clinical Relationship To Patient Self Return Phone Number [phone] (Primary) Chief Complaint Headache Reason for Call Symptomatic / Request for Health Information Initial Comment Caller states she is experiencing blood pressure of 169/99 with a headache. Translation No Nurse Assessment Nurse: Carylon Perches, RN, Hilda Lias Date/Time Lamount Cohen Time): 07/17/2023 8:47:57 AM Confirm and document reason for call. If symptomatic, describe symptoms. ---Caller states she has a headache for a few days. She has bp of 169/99. Tylenol doesn't help. Does the patient have any new or worsening symptoms? ---Yes Will a triage be completed? ---Yes Related visit to physician within the last 2 weeks? ---No Does the PT have any chronic conditions? (i.e. diabetes, asthma, this includes High risk factors for pregnancy, etc.) ---Yes List chronic conditions. ---shoulder problems Is this a behavioral health or substance abuse call? ---No Guidelines Guideline Title Affirmed Question Affirmed Notes Nurse Date/Time (Eastern Time) Blood Pressure - High [1] Systolic BP >= 160 OR Diastolic >= 100 AND [2] cardiac (e.g., breathing difficulty, chest pain) or neurologic symptoms (e.g., new-onset blurred Mordecai Maes 07/17/2023 8:50:13 AM Guidelines Guideline Title Affirmed Question Affirmed Notes Nurse Date/Time (Eastern Time) or double vision, unsteady gait) Disp. Time Lamount Cohen Time) Disposition Final User 07/17/2023 8:52:21 AM Go to ED Now  Yes Carylon Perches, RN, Hilda Lias Final Disposition 07/17/2023 8:52:21 AM Go to ED Now Yes Carylon Perches, RN, Seward Grater Disagree/Comply Comply Caller Understands Yes PreDisposition Did not know what to do Care Advice Given Per Guideline GO TO ED NOW: * You need to be seen in the Emergency Department. * Leave now. Drive carefully. * Another adult should drive. NOTE TO TRIAGER - DRIVING: CARE ADVICE given per Blood Pressure - High (Adult) guideline. Referrals [facility] - ED

## 2023-07-17 NOTE — Telephone Encounter (Signed)
See Triage note, pt sent to ED.

## 2023-07-17 NOTE — ED Provider Notes (Signed)
[place] EMERGENCY DEPARTMENT AT [facility] Provider Note   CSN: [id number] Arrival date & time: 07/17/23  5366     History  No chief complaint on file.   Samantha Becker is a 67 y.o. female.  The history is provided by the patient, the spouse and medical records. No language interpreter was used.     66 year old female without history of hypertension presenting with concerns of elevated blood pressure.  Patient mentioned that her daughter recently diagnosed with elevated blood pressure from a doctor's visit.  She used her blood pressure cuff machine at home to check her daughter's blood pressure as well as checking her ears.  She noted that her blood pressure was elevated in the 160s systolic.  Today she rechecked it again and states that it was reading around the 150-160 systolic once again thus prompting this ER visit.  Patient without any specific complaint except stating that she feels a bit fatigued with some mild pressure sensation across her forehead.  She mention she has a bad left shoulder and normally receiving cortisone shots.  She is due for another cortisone shot so she has been taking Motrin 800 mg every 4 hours for the past several days.  She has a colonoscopy coming up so she stopped taking NSAIDs approximately 3 days ago and now taking Tylenol for pain.  She does not complain of any vision changes, chest pain, trouble breathing, abdominal pain, nausea vomit diarrhea focal numbness focal weakness.  Denies any recent sickness.  Home Medications Prior to Admission medications   Medication Sig Start Date End Date Taking? Authorizing Provider  Calcium Carb-Cholecalciferol (CALCIUM 500 + D PO) Take 2 each by mouth daily in the afternoon.    [provider]  conjugated estrogens (PREMARIN) vaginal cream Premarin 0.625 mg/gram vaginal cream  Insert 0.5 applicatorsful twice a week by vaginal route. 02/28/22   Jarold Motto, PA  diclofenac Sodium  (VOLTAREN) 1 % GEL Apply 2 g topically as needed.    [provider]  fluticasone (FLONASE) 50 MCG/ACT nasal spray Place 1 spray into both nostrils as needed for allergies or rhinitis.    [provider]  ibuprofen (ADVIL) 200 MG tablet Take 800 mg by mouth every 6 (six) hours as needed.    [provider]  triamcinolone cream (KENALOG) 0.1 % APPLY TO AFFECTED AREA 1-2 TIMES DAILY. 04/03/23   Jarold Motto, PA  TURMERIC PO Take 3 tablets by mouth daily in the afternoon.    [provider]      Allergies    Morphine    Review of Systems   Review of Systems  All other systems reviewed and are negative.   Physical Exam Updated Vital Signs BP (!) 162/85   Pulse 62   Temp 98 F (36.7 C) (Oral)   Resp 16   Wt 59.9 kg   LMP  (LMP Unknown)   SpO2 98%   BMI 24.14 kg/m  Physical Exam Vitals and nursing note reviewed.  Constitutional:      General: She is not in acute distress.    Appearance: She is well-developed.  HENT:     Head: Normocephalic and atraumatic.  Eyes:     Extraocular Movements: Extraocular movements intact.     Conjunctiva/sclera: Conjunctivae normal.     Pupils: Pupils are equal, round, and reactive to light.  Cardiovascular:     Rate and Rhythm: Normal rate and regular rhythm.     Pulses: Normal pulses.  Heart sounds: Normal heart sounds.  Pulmonary:     Effort: Pulmonary effort is normal.     Breath sounds: No wheezing, rhonchi or rales.  Abdominal:     Palpations: Abdomen is soft.     Tenderness: There is no abdominal tenderness.  Musculoskeletal:     Cervical back: Normal range of motion and neck supple.  Skin:    Findings: No rash.  Neurological:     Mental Status: She is alert and oriented to person, place, and time.     GCS: GCS eye subscore is 4. GCS verbal subscore is 5. GCS motor subscore is 6.     Cranial Nerves: Cranial nerves 2-12 are intact.     Sensory: Sensation is intact.     Motor: Motor function  is intact.     Coordination: Coordination is intact.  Psychiatric:        Mood and Affect: Mood normal.     ED Results / Procedures / Treatments   Labs (all labs ordered are listed, but only abnormal results are displayed) Labs Reviewed - No data to display  EKG None  Radiology No results found.  Procedures Procedures    Medications Ordered in ED Medications - No data to display  ED Course/ Medical Decision Making/ A&P                                 Medical Decision Making Risk Prescription drug management.   BP (!) 180/92   Pulse 67   Temp 98 F (36.7 C) (Oral)   Resp 16   Wt 59.9 kg   LMP  (LMP Unknown)   SpO2 100%   BMI 24.14 kg/m   67:6 AM  67 year old female without history of hypertension presenting with concerns of elevated blood pressure.  Patient mentioned that her daughter recently diagnosed with elevated blood pressure from a doctor's visit.  She used her blood pressure cuff machine at home to check her daughter's blood pressure as well as checking her ears.  She noted that her blood pressure was elevated in the 160s systolic.  Today she rechecked it again and states that it was reading around the 150-160 systolic once again thus prompting this ER visit.  Patient without any specific complaint except stating that she feels a bit fatigued with some mild pressure sensation across her forehead.  She mention she has a bad left shoulder and normally receiving cortisone shots.  She is due for another cortisone shot so she has been taking Motrin 800 mg every 4 hours for the past several days.  She has a colonoscopy coming up so she stopped taking NSAIDs approximately 3 days ago and now taking Tylenol for pain.  She does not complain of any vision changes, chest pain, trouble breathing, abdominal pain, nausea vomit diarrhea focal numbness focal weakness.  Denies any recent sickness.  Exam overall reassuring patient without any focal neurodeficit and she is  mentating appropriately heart and lung sounds normal moving all 4 extremities equally and has intact distal pulses.  Current vital signs as 180 systolic.  Highest document blood pressure is 199/91.  Patient noted to be hypertensive in the emergency department.  No signs of hypertensive urgency.  Discussed with patient the need for close follow-up and management by their primary care physician.  I have considered a CT scan of the head, EKG, chest x-ray, CBC, CMP, troponin as part of the labs and  imaging to screen for potential hypertensive emergency but felt that it is low yield.  Patient agrees.  Care discussed with Dr. Rosalia Hammers.  Since patient has a colonoscopy coming up, I felt patient can follow-up closely with her PCP for blood pressure recheck before starting antihypertensive medication.  I suspect her elevated blood pressure may be due to recent NSAID use as it is known to cause a rise in blood pressure.  After further consideration, I will also prescribe antihypertensive medications, Diovan for patient to have available to use if her blood pressure persist despite being off from NSAIDs.  Strong consideration for patient to follow-up closely with her PCP for further workup.  9:55 AM On recheck her current blood pressure is 162/85.        Final Clinical Impression(s) / ED Diagnoses Final diagnoses:  Uncontrolled hypertension    Rx / DC Orders ED Discharge Orders          Ordered    valsartan (DIOVAN) 80 MG tablet  Daily        07/17/23 0954              Fayrene Helper, PA-C 07/17/23 2952    Margarita Grizzle, MD 07/18/23 1154

## 2023-07-17 NOTE — ED Notes (Signed)
Discharge paperwork given and verbally understood. 

## 2023-07-18 ENCOUNTER — Telehealth: Payer: Self-pay

## 2023-07-18 ENCOUNTER — Other Ambulatory Visit: Payer: Self-pay

## 2023-07-18 DIAGNOSIS — Z1211 Encounter for screening for malignant neoplasm of colon: Secondary | ICD-10-CM

## 2023-07-18 NOTE — Telephone Encounter (Signed)
Agree with rescheduling colonoscopy until after BP is under good control for a few weeks.

## 2023-07-18 NOTE — Telephone Encounter (Signed)
Patient called explaining that she went to  her PCP and they discovered uncontrolled HTN yesterday and advised her to go to the ED. The ED started her on Valsartan and she has continued to have high BP all day. It is currently 179/97 and she has a throbbing headache. She was wanting to make sure if she prepped tonight and tomorrow and had HTN tomorrow she did not want to get cancelled. I explained that the prep could cause her pressure to be elevated in the morning and could not guarantee that her procedure would not get cancelled. I explained that it may be better to continue her new BP medication for a few weeks and to reschedule, she agreed and we rescheduled after the first of the year.

## 2023-07-19 ENCOUNTER — Encounter: Payer: Medicare Other | Admitting: Gastroenterology

## 2023-07-20 LAB — HELIX MOLECULAR SCREEN: Genetic Analysis Overall Interpretation: NEGATIVE

## 2023-07-20 LAB — GENECONNECT MOLECULAR SCREEN

## 2023-07-30 NOTE — Progress Notes (Signed)
Samantha Becker is a 67 y.o. female here for an ED follow-up, pre-operative evaluation.  History of Present Illness:   No chief complaint on file.   HPI  Elevated Blood Pressure Seen in [facility] ED on 07/17/23 for elevated blood pressure. High blood pressure suspected to be due to recent NSAID use.  Osteoarthritis Left Hip Has received injections for left hip pain by Dr. Steward Drone. Pending left hip AC arthroscopy and debridement.   Past Medical History:  Diagnosis Date   Arthritis    Chicken pox    GERD (gastroesophageal reflux disease)    OTC   Leaking of urine    Low iron    Mumps      Social History   Tobacco Use   Smoking status: Never   Smokeless tobacco: Never  Vaping Use   Vaping status: Never Used  Substance Use Topics   Alcohol use: Yes    Alcohol/week: 4.0 standard drinks of alcohol    Types: 2 Glasses of wine, 2 Cans of beer per week    Comment: wine 3-4 times a week   Drug use: No    Past Surgical History:  Procedure Laterality Date   CESAREAN SECTION     4 cs   LAPAROSCOPY  03/04/2012   Procedure: LAPAROSCOPY OPERATIVE;  Surgeon: Hal Morales, MD;  Location: [facility];  Service: Gynecology;  Laterality: N/A;  Peritoneum Biopsy   SHOULDER ARTHROSCOPY Right 03/20/2022   Procedure: RIGHT SHOULDER ARTHROSCOPY WITH DISTAL CLAVICLE RESECTION;  Surgeon: Huel Cote, MD;  Location: [facility];  Service: Orthopedics;  Laterality: Right;   TUBAL LIGATION Bilateral     Family History  Problem Relation Age of Onset   Heart disease Mother    Hypertension Mother    Arthritis Mother    Breast cancer Mother    Osteoporosis Mother    Heart failure Mother    Heart disease Father    Hypertension Father    Cancer Father    Stroke Father    Arthritis Sister    Asthma Sister    Colon cancer Neg Hx    Esophageal cancer Neg Hx    Rectal cancer Neg Hx    Stomach cancer Neg Hx    Colon polyps Neg Hx     Allergies  Allergen  Reactions   Morphine Nausea Only    Current Medications:   Current Outpatient Medications:    Calcium Carb-Cholecalciferol (CALCIUM 500 + D PO), Take 2 each by mouth daily in the afternoon., Disp: , Rfl:    conjugated estrogens (PREMARIN) vaginal cream, Premarin 0.625 mg/gram vaginal cream  Insert 0.5 applicatorsful twice a week by vaginal route., Disp: 42.5 g, Rfl: 2   ibuprofen (ADVIL) 200 MG tablet, Take 800 mg by mouth every 6 (six) hours as needed., Disp: , Rfl:    tretinoin (RETIN-A) 0.025 % cream, Apply 1 Application topically every evening., Disp: , Rfl:    triamcinolone cream (KENALOG) 0.1 %, APPLY TO AFFECTED AREA 1-2 TIMES DAILY., Disp: 45 g, Rfl: 0   TURMERIC PO, Take 3 tablets by mouth daily in the afternoon., Disp: , Rfl:    valsartan (DIOVAN) 80 MG tablet, Take 1 tablet (80 mg total) by mouth daily for 15 days., Disp: 15 tablet, Rfl: 0   Review of Systems:   ROS  Vitals:   There were no vitals filed for this visit.   There is no height or weight on file to calculate BMI.  Physical Exam:  Physical Exam  Assessment and Plan:   ***   I,Alexander Ruley,acting as a scribe for [employer], PA.,have documented all relevant documentation on the behalf of Jarold Motto, PA,as directed by  Jarold Motto, PA while in the presence of Jarold Motto, [place].   ***  Jarold Motto, PA-C

## 2023-07-31 ENCOUNTER — Ambulatory Visit (INDEPENDENT_AMBULATORY_CARE_PROVIDER_SITE_OTHER): Payer: Medicare Other | Admitting: Physician Assistant

## 2023-07-31 ENCOUNTER — Encounter: Payer: Self-pay | Admitting: Physician Assistant

## 2023-07-31 VITALS — BP 136/80 | HR 80 | Temp 98.0°F | Wt 134.6 lb

## 2023-07-31 DIAGNOSIS — Z01818 Encounter for other preprocedural examination: Secondary | ICD-10-CM

## 2023-07-31 DIAGNOSIS — I10 Essential (primary) hypertension: Secondary | ICD-10-CM | POA: Diagnosis not present

## 2023-08-01 ENCOUNTER — Encounter: Payer: Self-pay | Admitting: Physician Assistant

## 2023-08-02 ENCOUNTER — Other Ambulatory Visit (HOSPITAL_BASED_OUTPATIENT_CLINIC_OR_DEPARTMENT_OTHER): Payer: Self-pay

## 2023-08-02 ENCOUNTER — Other Ambulatory Visit: Payer: Self-pay

## 2023-08-02 MED ORDER — VALSARTAN 80 MG PO TABS
80.0000 mg | ORAL_TABLET | Freq: Every day | ORAL | 5 refills | Status: DC
  Filled 2023-08-02: qty 30, 30d supply, fill #0
  Filled 2023-08-29: qty 30, 30d supply, fill #1
  Filled 2023-09-27: qty 30, 30d supply, fill #2
  Filled 2023-10-29: qty 30, 30d supply, fill #3
  Filled 2023-11-28: qty 30, 30d supply, fill #4
  Filled 2023-12-30: qty 30, 30d supply, fill #5

## 2023-08-02 NOTE — Telephone Encounter (Signed)
Please advise on 6 month supply. Previously filled by historical provider.

## 2023-08-12 ENCOUNTER — Other Ambulatory Visit (HOSPITAL_BASED_OUTPATIENT_CLINIC_OR_DEPARTMENT_OTHER): Payer: Self-pay | Admitting: Orthopaedic Surgery

## 2023-08-12 DIAGNOSIS — M19019 Primary osteoarthritis, unspecified shoulder: Secondary | ICD-10-CM

## 2023-08-26 ENCOUNTER — Other Ambulatory Visit: Payer: Self-pay | Admitting: Orthopaedic Surgery

## 2023-08-26 ENCOUNTER — Encounter: Payer: Self-pay | Admitting: Orthopaedic Surgery

## 2023-08-26 DIAGNOSIS — M19012 Primary osteoarthritis, left shoulder: Secondary | ICD-10-CM | POA: Diagnosis not present

## 2023-08-26 MED ORDER — IBUPROFEN 800 MG PO TABS: 800.0000 mg | ORAL_TABLET | Freq: Three times a day (TID) | ORAL | 0 refills | Status: AC

## 2023-08-26 MED ORDER — ASPIRIN 325 MG PO TBEC: 325.0000 mg | DELAYED_RELEASE_TABLET | Freq: Every day | ORAL | 0 refills | Status: DC

## 2023-08-26 MED ORDER — ACETAMINOPHEN 500 MG PO TABS: 500.0000 mg | ORAL_TABLET | Freq: Three times a day (TID) | ORAL | 0 refills | Status: AC

## 2023-08-26 NOTE — Progress Notes (Signed)
   Date of Surgery: 08/26/2023  INDICATIONS: Samantha Becker is a 67 y.o.-year-old female with left shoulder symptomatic AC joint osteoarthritis.  The risk and benefits of the procedure were discussed in detail and documented in the pre-operative evaluation.   PREOPERATIVE DIAGNOSES: Left shoulder, AC arthritis and subacromial impingement.  POSTOPERATIVE DIAGNOSIS: Same.  PROCEDURE: Arthroscopic distal clavicle excision - 82956 Arthroscopic subacromial decompression - 21308  SURGEON: Benancio Deeds MD  ASSISTANT: Kerby Less, ATC  ANESTHESIA:  general plus interscalene nerve block  IV FLUIDS AND URINE: See anesthesia record.  ANTIBIOTICS: Ancef  ESTIMATED BLOOD LOSS: 5 mL.  IMPLANTS:  * No surgical log found *  DRAINS: None  CULTURES: None  COMPLICATIONS: none  PROCEDURE:    OPERATIVE FINDING: Exam under anesthesia:   Examination under anesthesia revealed forward elevation of 150 degrees.  With the arm at the side, there was 65 degrees of external rotation.  There is a 1+ anterior load shift and a 1+ posterior load shift.    Arthroscopic findings demonstrated: Articular space: normal Chondral surfaces: Normal Biceps: intact Subscapularis: Intact Supraspinatus: normal Infraspinatus: normal    I identified the patient in the pre-operative holding area.  I marked the operative right shoulder with my initials. I reviewed the risks and benefits of the proposed surgical intervention and the patient wished to proceed.  Anesthesia was then performed with regional block.  The patient was transferred to the operative suite and placed in the beach chair position with all bony prominences padded.     SCDs were placed on bilateral lower extremity. Appropriate antibiotics was administered within 1 hour before incision.  Anesthesia was induced.  The operative extremity was then prepped and draped in standard fashion. A time out was performed confirming the correct extremity,  correct patient and correct procedure.   The arthroscope was introduced in the glenohumeral joint from a posterior portal.  An anterior portal was created.  The shoulder was examined and the above findings were noted.      The rotator cuff was then approached through the subacromial space. Anterior, lateral, and posterior portals were used.  Bursectomy was performed with an arthroscopic shaver and ArthroCare wand.  The soft tissues on the undersurface of the acromion and clavicle were resected with the arthroscopic shaver and wand. The distal clavicle was resected using arthroscopic burr to the width of the burr.   The shoulder was irrigated.  The arthroscopic instruments were removed.  Wounds were closed with 3-0 nylon sutures.  A sterile dressing was applied with xeroform, 4x8s, abdominal pad, and tape. An [name] was placed and the upper extremity was placed in a shoulder immobilizer.  The patient tolerated the procedure well and was taken to the recovery room in stable condition.  All counts were correct in the case. The patient tolerated the procedure well and was taken to the recovery room in stable condition.    POSTOPERATIVE PLAN: She will be activity as tolerated after her nerve block wears off. She will be placed on aspirin for blood clot prevention. She will be seen by PT postop with the debridement protocol.  Benancio Deeds, MD 3:18 PM

## 2023-08-29 ENCOUNTER — Ambulatory Visit (HOSPITAL_BASED_OUTPATIENT_CLINIC_OR_DEPARTMENT_OTHER): Payer: Medicare Other | Admitting: Physical Therapy

## 2023-08-29 ENCOUNTER — Ambulatory Visit: Payer: Medicare Other | Admitting: Physical Therapy

## 2023-08-29 ENCOUNTER — Encounter: Payer: Self-pay | Admitting: Physical Therapy

## 2023-08-29 DIAGNOSIS — M25512 Pain in left shoulder: Secondary | ICD-10-CM | POA: Diagnosis not present

## 2023-08-29 NOTE — Therapy (Signed)
 OUTPATIENT PHYSICAL THERAPY UPPER EXTREMITY EVALUATION   Patient Name: Samantha Becker MRN: [id number] DOB:06/20/1956, 68 y.o., female Today's Date: 08/29/2023  END OF SESSION:  PT End of Session - 08/29/23 1537     Visit Number 1    Number of Visits 16    Date for PT Re-Evaluation 10/24/23    Authorization Type Medicare    PT Start Time 1434    PT Stop Time 1513    PT Time Calculation (min) 39 min    Activity Tolerance Patient tolerated treatment well    Behavior During Therapy WFL for tasks assessed/performed             Past Medical History:  Diagnosis Date   Arthritis    Chicken pox    GERD (gastroesophageal reflux disease)    OTC   Leaking of urine    Low iron    Mumps    Past Surgical History:  Procedure Laterality Date   CESAREAN SECTION     4 cs   LAPAROSCOPY  03/04/2012   Procedure: LAPAROSCOPY OPERATIVE;  Surgeon: [name], MD;  Location: [facility];  Service: Gynecology;  Laterality: N/A;  Peritoneum Biopsy   SHOULDER ARTHROSCOPY Right 03/20/2022   Procedure: RIGHT SHOULDER ARTHROSCOPY WITH DISTAL CLAVICLE RESECTION;  Surgeon: [name], MD;  Location: [facility];  Service: Orthopedics;  Laterality: Right;   TUBAL LIGATION Bilateral    Patient Active Problem List   Diagnosis Date Noted   Osteoarthritis of AC (acromioclavicular) joint    Endolymphatic hydrops of right ear 08/18/2020   Right-sided sensorineural hearing loss 08/18/2020   Atrophic vaginitis 03/20/2017   Endometriosis 03/18/2012   Dyspareunia 02/01/2012    PCP: [name]   REFERRING PROVIDER: [name]  REFERRING DIAG: L shoulder s/p arthroscopy, DCE and SAD  THERAPY DIAG:  Acute pain of left shoulder  Rationale for Evaluation and Treatment: Rehabilitation  ONSET DATE: Surgery 08/26/23  SUBJECTIVE:                                                                                                                                                                                       SUBJECTIVE STATEMENT: L shoulder Arthrsoscopy 08/26/23 with DCE and SAD. She has MD f/u on Monday 09/09/23. She is doing fairly well, bandages in place today, has been icing. States difficulty sleeping due to positioning, has been sleeping propped up in bed. Had previous R shoulder scope last year, R shoulder doing very well a this time.   Hand dominance: Right  PERTINENT HISTORY: None (previous R shoulder scope)   PAIN:  Are you having pain? Yes: NPRS scale: 6 /10  Pain location: L shoulder  Pain description: sore, painful Aggravating factors: sore at rest/post op, sleeping,  Relieving factors: none stated   PRECAUTIONS: None  RED FLAGS: None   WEIGHT BEARING RESTRICTIONS: No  FALLS:  Has patient fallen in last 6 months? No   PLOF: Independent  PATIENT GOALS: Decreased pain in shoulder, improved  use of arm.   NEXT MD VISIT:   OBJECTIVE:   DIAGNOSTIC FINDINGS:    PATIENT SURVEYS :   Eval: FOTO  22  COGNITION: Overall cognitive status: Within functional limits for tasks assessed     SENSATION: WFL   POSTURE:   UPPER EXTREMITY ROM:   Active  PROM Right eval Left eval  Shoulder flexion  125  Shoulder extension    Shoulder abduction    Shoulder adduction    Shoulder internal rotation    Shoulder external rotation  45  Elbow flexion    Elbow extension    Wrist flexion    Wrist extension    Wrist ulnar deviation    Wrist radial deviation    Wrist pronation    Wrist supination    (Blank rows = not tested)  UPPER EXTREMITY MMT:   not tested due to surgery   MMT Right eval Left eval  Shoulder flexion    Shoulder extension    Shoulder abduction    Shoulder adduction    Shoulder internal rotation    Shoulder external rotation    Middle trapezius    Lower trapezius    Elbow flexion    Elbow extension    Wrist flexion    Wrist extension    Wrist ulnar deviation    Wrist radial deviation    Wrist  pronation    Wrist supination    Grip strength (lbs)    (Blank rows = not tested)    JOINT MOBILITY TESTING:    PALPATION:  L shoulder post op bandages in place, removed today, sterile gauze and tegaderm placed over all 3 incisions.  Stitches intact in 3 surgical sites at L shoulder     TODAY'S TREATMENT:                                                                                                                                         DATE:   08/29/2023  Therapeutic Exercise: Aerobic: Supine:  Shoulder aarom, 2 hand hold, press from table to 90 deg x 10;  Seated: Standing:  elbow ROM x 10 flex/ext;  wrist ROM x 10 flex/ext;  hand open/close x 15;  Pendulum L/R x 2 min;  Stretches:  Neuromuscular Re-education: Manual Therapy: Therapeutic Activity: Self Care: education on sling use and arm positioning with this, keeping incisions covered for showering, and restrictions for use/ROM at this time.    PATIENT EDUCATION:  Education details: PT POC, Exam findings, HEP Person educated: Patient Education method: Explanation, Demonstration, Tactile cues, Verbal  cues, and Handouts Education comprehension: verbalized understanding, returned demonstration, verbal cues required, tactile cues required, and needs further education   HOME EXERCISE PROGRAM: Access Code: [email or web address] URL: [email or web address] Date: 08/29/2023 Prepared by: Tinnie Don  Exercises - Seated Wrist Extension AROM  - 3 x daily - 1-2 sets - 10 reps - Hand AROM Composite Flexion  - 3 x daily - 1-2 sets - 10 reps - Supine Shoulder Flexion AAROM with Hands Clasped  - 1-2 x daily - 1 sets - 10 reps  ASSESSMENT:  CLINICAL IMPRESSION: Eval:  Patient presents with deficits following L shoulder arthroscopy with SAD and DCE on 08/26/23. Bandages replaced with tegaderm today. She has limited ROM for prom and arom, as well as weakness in L shoulder. She has significant limitation in ability for any  functional activity, lifting, carrying, ADLs or IADLS at this time due to surgery.  Pt to benefit from skilled PT to improve deficits and pain.    OBJECTIVE IMPAIRMENTS: decreased activity tolerance, decreased knowledge of use of DME, decreased mobility, decreased ROM, decreased strength, impaired UE functional use, and pain.   ACTIVITY LIMITATIONS: carrying, lifting, sleeping, bed mobility, bathing, toileting, dressing, self feeding, reach over head, hygiene/grooming, locomotion level, and caring for others  PARTICIPATION LIMITATIONS: meal prep, cleaning, laundry, driving, shopping, community activity, and yard work  PERSONAL FACTORS:  none  are also affecting patient's functional outcome.   REHAB POTENTIAL: Good  CLINICAL DECISION MAKING: Stable/uncomplicated  EVALUATION COMPLEXITY: Low  GOALS: Goals reviewed with patient? Yes   SHORT TERM GOALS: Target date: 09/26/2023     Pt to be independent with initial HEP  Goal status: INITIAL  2.  Pt to demo ability for full PROM of L shoulder   Goal status: INITIAL    LONG TERM GOALS: Target date: 10/24/2023    Pt to be independent with final HEP  Goal status: INITIAL  2.  Pt to demo improved AROM to be [facility] and pain free, to improve ability for ADLs.   Goal status: INITIAL  3.  Pt to demo improved strength of L shoulder to be at least 4/5, to improve ability for IADLS and ADLS.   Goal status: INITIAL  4.  Pt to demo ability for functional reach, lift, carry , up to 5 lb without deficits, for improved ability for IADLs and community activities.   Goal status: INITIAL    PLAN: PT FREQUENCY: 1-2x/week  PT DURATION: 8 weeks  PLANNED INTERVENTIONS: Therapeutic exercises, Therapeutic activity, Neuromuscular re-education, Patient/Family education, Self Care, Joint mobilization, Joint manipulation, Stair training, DME instructions, Aquatic Therapy, Dry Needling, Electrical stimulation, Cryotherapy, Moist heat, Taping,  Ultrasound, Ionotophoresis 4mg /ml Dexamethasone , Manual therapy,  Vasopneumatic device, Traction, Spinal manipulation, Spinal mobilization,    PLAN FOR NEXT SESSION:  PROM, AAROM as tolerated    Tinnie Don, PT, DPT 3:45 PM  08/29/23

## 2023-08-30 ENCOUNTER — Encounter: Payer: Medicare Other | Admitting: Gastroenterology

## 2023-09-02 ENCOUNTER — Ambulatory Visit (INDEPENDENT_AMBULATORY_CARE_PROVIDER_SITE_OTHER): Payer: Medicare Other | Admitting: Physical Therapy

## 2023-09-02 ENCOUNTER — Encounter: Payer: Self-pay | Admitting: Physical Therapy

## 2023-09-02 DIAGNOSIS — M25512 Pain in left shoulder: Secondary | ICD-10-CM

## 2023-09-02 NOTE — Therapy (Signed)
 OUTPATIENT PHYSICAL THERAPY UPPER EXTREMITY TREATMENT   Patient Name: Samantha Becker MRN: [id number] DOB:Nov 09, 1955, 68 y.o., female Today's Date: 09/02/2023  END OF SESSION:  PT End of Session - 09/02/23 1258     Visit Number 2    Number of Visits 16    Date for PT Re-Evaluation 10/24/23    Authorization Type Medicare    PT Start Time 1300    PT Stop Time 1340    PT Time Calculation (min) 40 min    Activity Tolerance Patient tolerated treatment well    Behavior During Therapy WFL for tasks assessed/performed             Past Medical History:  Diagnosis Date   Arthritis    Chicken pox    GERD (gastroesophageal reflux disease)    OTC   Leaking of urine    Low iron    Mumps    Past Surgical History:  Procedure Laterality Date   CESAREAN SECTION     4 cs   LAPAROSCOPY  03/04/2012   Procedure: LAPAROSCOPY OPERATIVE;  Surgeon: [name], MD;  Location: [facility];  Service: Gynecology;  Laterality: N/A;  Peritoneum Biopsy   SHOULDER ARTHROSCOPY Right 03/20/2022   Procedure: RIGHT SHOULDER ARTHROSCOPY WITH DISTAL CLAVICLE RESECTION;  Surgeon: [name], MD;  Location: [facility];  Service: Orthopedics;  Laterality: Right;   TUBAL LIGATION Bilateral    Patient Active Problem List   Diagnosis Date Noted   Osteoarthritis of AC (acromioclavicular) joint    Endolymphatic hydrops of right ear 08/18/2020   Right-sided sensorineural hearing loss 08/18/2020   Atrophic vaginitis 03/20/2017   Endometriosis 03/18/2012   Dyspareunia 02/01/2012    PCP: [name]   REFERRING PROVIDER: [name]  REFERRING DIAG: L shoulder s/p arthroscopy, DCE and SAD  THERAPY DIAG:  Acute pain of left shoulder  Rationale for Evaluation and Treatment: Rehabilitation  ONSET DATE: Surgery 08/26/23  SUBJECTIVE:                                                                                                                                                                                       SUBJECTIVE STATEMENT:  09/02/2023 Pt states doing well, mild soreness.  Eval: L shoulder Arthrsoscopy 08/26/23 with DCE and SAD. She has MD f/u on Monday 09/09/23. She is doing fairly well, bandages in place today, has been icing. States difficulty sleeping due to positioning, has been sleeping propped up in bed. Had previous R shoulder scope last year, R shoulder doing very well a this time.   Hand dominance: Right  PERTINENT HISTORY: None (previous R shoulder scope)   PAIN:  Are you having pain? Yes: NPRS scale: 6 /10 Pain location: L shoulder  Pain description: sore, painful Aggravating factors: sore at rest/post op, sleeping,  Relieving factors: none stated   PRECAUTIONS: None  RED FLAGS: None   WEIGHT BEARING RESTRICTIONS: No  FALLS:  Has patient fallen in last 6 months? No   PLOF: Independent  PATIENT GOALS: Decreased pain in shoulder, improved  use of arm.   NEXT MD VISIT:   OBJECTIVE:   DIAGNOSTIC FINDINGS:    PATIENT SURVEYS :   Eval: FOTO  22  COGNITION: Overall cognitive status: Within functional limits for tasks assessed     SENSATION: WFL  POSTURE:  UPPER EXTREMITY ROM:   Active  PROM Right eval Left eval Left 09/02/23:     Shoulder flexion  125 148    Shoulder extension       Shoulder abduction       Shoulder adduction       Shoulder internal rotation       Shoulder external rotation  45 60    Elbow flexion       Elbow extension       Wrist flexion       Wrist extension       Wrist ulnar deviation       Wrist radial deviation       Wrist pronation       Wrist supination       (Blank rows = not tested)   UPPER EXTREMITY MMT:   not tested due to surgery   MMT Right eval Left eval  Shoulder flexion    Shoulder extension    Shoulder abduction    Shoulder adduction    Shoulder internal rotation    Shoulder external rotation    Middle trapezius    Lower trapezius    Elbow flexion     Elbow extension    Wrist flexion    Wrist extension    Wrist ulnar deviation    Wrist radial deviation    Wrist pronation    Wrist supination    Grip strength (lbs)    (Blank rows = not tested)    JOINT MOBILITY TESTING:    PALPATION:  L shoulder post op bandages in place, removed today, sterile gauze and tegaderm placed over all 3 incisions.  Stitches intact in 3 surgical sites at L shoulder     TODAY'S TREATMENT:                                                                                                                                         DATE:   09/02/2023  Therapeutic Exercise: Aerobic: Supine:  Shoulder aarom, 2 hand hold x 10;  with cane 2 x 10;   ER 2 x 15 AARom with cane;  Seated: pulley flexion x 3 min;  Gripping/towel 3 sec x 10;  Standing:  Ball rolls on table at 90 deg, flexion and scaption x 15 ea;  Scap squeeze x 15;  shoulder ex x 10 arom;  Pendulum L/R and A/P  x 1 min ea ;  Stretches:  Neuromuscular Re-education: Manual Therapy: PROM flexion, abd, IR, ER, to tolerance.  Therapeutic Activity: Self Care:    Previous:  Therapeutic Exercise: Aerobic: Supine:  Shoulder aarom, 2 hand hold, press from table to 90 deg x 10;  Seated: Standing:  elbow ROM x 10 flex/ext;  wrist ROM x 10 flex/ext;  hand open/close x 15;  Pendulum L/R x 2 min;  Stretches:  Neuromuscular Re-education: Manual Therapy: Therapeutic Activity: Self Care: education on sling use and arm positioning with this, keeping incisions covered for showering, and restrictions for use/ROM at this time.    PATIENT EDUCATION:  Education details: updated and reviewed HEP Person educated: Patient Education method: Explanation, Demonstration, Tactile cues, Verbal cues, and Handouts Education comprehension: verbalized understanding, returned demonstration, verbal cues required, tactile cues required, and needs further education   HOME EXERCISE PROGRAM: Access Code: [email or web address] URL:  [email or web address] Date: 08/29/2023 Prepared by: Tinnie Don  Exercises - Seated Wrist Extension AROM  - 3 x daily - 1-2 sets - 10 reps - Hand AROM Composite Flexion  - 3 x daily - 1-2 sets - 10 reps - Supine Shoulder Flexion AAROM with Hands Clasped  - 1-2 x daily - 1 sets - 10 reps  ASSESSMENT:  CLINICAL IMPRESSION: 09/02/2023 Pt with good tolerance for ther ex today. PROM and ability for [facility] much improved, with soreness only at higher elevation. Plan to progress ROM as tolerated.   Eval:  Patient presents with deficits following L shoulder arthroscopy with SAD and DCE on 08/26/23. Bandages replaced with tegaderm today. She has limited ROM for prom and arom, as well as weakness in L shoulder. She has significant limitation in ability for any functional activity, lifting, carrying, ADLs or IADLS at this time due to surgery.  Pt to benefit from skilled PT to improve deficits and pain.    OBJECTIVE IMPAIRMENTS: decreased activity tolerance, decreased knowledge of use of DME, decreased mobility, decreased ROM, decreased strength, impaired UE functional use, and pain.   ACTIVITY LIMITATIONS: carrying, lifting, sleeping, bed mobility, bathing, toileting, dressing, self feeding, reach over head, hygiene/grooming, locomotion level, and caring for others  PARTICIPATION LIMITATIONS: meal prep, cleaning, laundry, driving, shopping, community activity, and yard work  PERSONAL FACTORS:  none  are also affecting patient's functional outcome.   REHAB POTENTIAL: Good  CLINICAL DECISION MAKING: Stable/uncomplicated  EVALUATION COMPLEXITY: Low  GOALS: Goals reviewed with patient? Yes   SHORT TERM GOALS: Target date: 09/26/2023     Pt to be independent with initial HEP  Goal status: INITIAL  2.  Pt to demo ability for full PROM of L shoulder   Goal status: INITIAL    LONG TERM GOALS: Target date: 10/24/2023    Pt to be independent with final HEP  Goal  status: INITIAL  2.  Pt to demo improved AROM to be [facility] and pain free, to improve ability for ADLs.   Goal status: INITIAL  3.  Pt to demo improved strength of L shoulder to be at least 4/5, to improve ability for IADLS and ADLS.   Goal status: INITIAL  4.  Pt to demo ability for functional reach, lift, carry , up to 5 lb without deficits, for improved ability for IADLs and community activities.   Goal status: INITIAL    PLAN:  PT FREQUENCY: 1-2x/week  PT DURATION: 8 weeks  PLANNED INTERVENTIONS: Therapeutic exercises, Therapeutic activity, Neuromuscular re-education, Patient/Family education, Self Care, Joint mobilization, Joint manipulation, Stair training, DME instructions, Aquatic Therapy, Dry Needling, Electrical stimulation, Cryotherapy, Moist heat, Taping, Ultrasound, Ionotophoresis 4mg /ml Dexamethasone , Manual therapy,  Vasopneumatic device, Traction, Spinal manipulation, Spinal mobilization,    PLAN FOR NEXT SESSION:  PROM, AAROM as tolerated    Tinnie Don, PT, DPT 12:58 PM  09/02/23

## 2023-09-04 ENCOUNTER — Ambulatory Visit: Payer: Medicare Other | Admitting: Physical Therapy

## 2023-09-04 ENCOUNTER — Encounter (HOSPITAL_BASED_OUTPATIENT_CLINIC_OR_DEPARTMENT_OTHER): Payer: Medicare Other | Admitting: Physical Therapy

## 2023-09-04 ENCOUNTER — Encounter: Payer: Self-pay | Admitting: Physical Therapy

## 2023-09-04 DIAGNOSIS — M25512 Pain in left shoulder: Secondary | ICD-10-CM

## 2023-09-04 NOTE — Therapy (Signed)
 OUTPATIENT PHYSICAL THERAPY UPPER EXTREMITY TREATMENT   Patient Name: Samantha Becker MRN: [id number] DOB:1956/04/06, 67 y.o., female Today's Date: 09/04/2023  END OF SESSION:  PT End of Session - 09/04/23 1413     Visit Number 3    Number of Visits 16    Date for PT Re-Evaluation 10/24/23    Authorization Type Medicare    PT Start Time 1107    PT Stop Time 1140    PT Time Calculation (min) 33 min    Activity Tolerance Patient tolerated treatment well    Behavior During Therapy WFL for tasks assessed/performed              Past Medical History:  Diagnosis Date   Arthritis    Chicken pox    GERD (gastroesophageal reflux disease)    OTC   Leaking of urine    Low iron    Mumps    Past Surgical History:  Procedure Laterality Date   CESAREAN SECTION     4 cs   LAPAROSCOPY  03/04/2012   Procedure: LAPAROSCOPY OPERATIVE;  Surgeon: Samantha SHAUNNA Muscat, MD;  Location: [facility];  Service: Gynecology;  Laterality: N/A;  Peritoneum Biopsy   SHOULDER ARTHROSCOPY Right 03/20/2022   Procedure: RIGHT SHOULDER ARTHROSCOPY WITH DISTAL CLAVICLE RESECTION;  Surgeon: Samantha Standing, MD;  Location: [facility];  Service: Orthopedics;  Laterality: Right;   TUBAL LIGATION Bilateral    Patient Active Problem List   Diagnosis Date Noted   Osteoarthritis of AC (acromioclavicular) joint    Endolymphatic hydrops of right ear 08/18/2020   Right-sided sensorineural hearing loss 08/18/2020   Atrophic vaginitis 03/20/2017   Endometriosis 03/18/2012   Dyspareunia 02/01/2012    PCP: Samantha Becker   REFERRING PROVIDER: Standing Samantha  REFERRING DIAG: L shoulder s/p arthroscopy, DCE and SAD  THERAPY DIAG:  Acute pain of left shoulder  Rationale for Evaluation and Treatment: Rehabilitation  ONSET DATE: Surgery 08/26/23  SUBJECTIVE:                                                                                                                                                                                       SUBJECTIVE STATEMENT:  09/04/2023 Pt states doing well, she does have more tiredness and soreness in shoulder today and last night, thinks she may have over done activity in general yesterday, was more tired last night.   Eval: L shoulder Arthrsoscopy 08/26/23 with DCE and SAD. She has MD f/u on Monday 09/09/23. She is doing fairly well, bandages in place today, has been icing. States difficulty sleeping due to positioning, has been sleeping propped up in bed. Had previous R  shoulder scope last year, R shoulder doing very well a this time.   Hand dominance: Right  PERTINENT HISTORY: None (previous R shoulder scope)   PAIN:  Are you having pain? Yes: NPRS scale: 6 /10 Pain location: L shoulder  Pain description: sore, painful Aggravating factors: sore at rest/post op, sleeping,  Relieving factors: none stated   PRECAUTIONS: None  RED FLAGS: None   WEIGHT BEARING RESTRICTIONS: No  FALLS:  Has patient fallen in last 6 months? No   PLOF: Independent  PATIENT GOALS: Decreased pain in shoulder, improved  use of arm.   NEXT MD VISIT:   OBJECTIVE:   DIAGNOSTIC FINDINGS:    PATIENT SURVEYS :   Eval: FOTO  22  COGNITION: Overall cognitive status: Within functional limits for tasks assessed     SENSATION: WFL  POSTURE:  UPPER EXTREMITY ROM:   Active  PROM Right eval Left eval Left 09/02/23:     Shoulder flexion  125 148    Shoulder extension       Shoulder abduction       Shoulder adduction       Shoulder internal rotation       Shoulder external rotation  45 60    Elbow flexion       Elbow extension       Wrist flexion       Wrist extension       Wrist ulnar deviation       Wrist radial deviation       Wrist pronation       Wrist supination       (Blank rows = not tested)   UPPER EXTREMITY MMT:   not tested due to surgery   MMT Right eval Left eval  Shoulder flexion    Shoulder extension    Shoulder  abduction    Shoulder adduction    Shoulder internal rotation    Shoulder external rotation    Middle trapezius    Lower trapezius    Elbow flexion    Elbow extension    Wrist flexion    Wrist extension    Wrist ulnar deviation    Wrist radial deviation    Wrist pronation    Wrist supination    Grip strength (lbs)    (Blank rows = not tested)    JOINT MOBILITY TESTING:    PALPATION:  L shoulder post op bandages in place, removed today, sterile gauze and tegaderm placed over all 3 incisions.  Stitches intact in 3 surgical sites at L shoulder     TODAY'S TREATMENT:                                                                                                                                         DATE:   09/04/2023  Therapeutic Exercise: Aerobic: Supine:  Shoulder aarom, with cane x 10;   ER 2  x 10 AARom with cane;  AROM- ER: at 45 deg:  x 15; pec stretc 30 sec x 4 ;  Seated: pulley flexion x 3 min;  Becker:   Ball rolls on table at 90 deg, flexion and scaption x 15 ea;  wall slides 2 hands x 12;  Scap squeeze x 15;   Stretches:  Neuromuscular Re-education: Manual Therapy: PROM flexion, abd, IR, ER, to tolerance.  Therapeutic Activity: Self Care:    Previous:  Therapeutic Exercise: Aerobic: Supine:  Shoulder aarom, 2 hand hold, press from table to 90 deg x 10;  Seated: Becker:  elbow ROM x 10 flex/ext;  wrist ROM x 10 flex/ext;  hand open/close x 15;  Pendulum L/R x 2 min;  Stretches:  Neuromuscular Re-education: Manual Therapy: Therapeutic Activity: Self Care: education on sling use and arm positioning with this, keeping incisions covered for showering, and restrictions for use/ROM at this time.    PATIENT EDUCATION:  Education details: updated and reviewed HEP Person educated: Patient Education method: Explanation, Demonstration, Tactile cues, Verbal cues, and Handouts Education comprehension: verbalized understanding, returned demonstration, verbal  cues required, tactile cues required, and needs further education   HOME EXERCISE PROGRAM: Access Code: [email or web address] URL: [email or web address] Date: 08/29/2023 Prepared by: Samantha Becker  Exercises - Seated Wrist Extension AROM  - 3 x daily - 1-2 sets - 10 reps - Hand AROM Composite Flexion  - 3 x daily - 1-2 sets - 10 reps - Supine Shoulder Flexion AAROM with Hands Clasped  - 1-2 x daily - 1 sets - 10 reps  ASSESSMENT:  CLINICAL IMPRESSION: 09/04/2023 Pt with good tolerance for ther ex today. PROM improving, still sore at end ranges. Discussed not over doing activity if pt is more sore today. Pt able to get light stretch in supine with pec stretch, without increased pain. Plan to progress light ROM as tolerated.   Eval:  Patient presents with deficits following L shoulder arthroscopy with SAD and DCE on 08/26/23. Bandages replaced with tegaderm today. She has limited ROM for prom and arom, as well as weakness in L shoulder. She has significant limitation in ability for any functional activity, lifting, carrying, ADLs or IADLS at this time due to surgery.  Pt to benefit from skilled PT to improve deficits and pain.    OBJECTIVE IMPAIRMENTS: decreased activity tolerance, decreased knowledge of use of DME, decreased mobility, decreased ROM, decreased strength, impaired UE functional use, and pain.   ACTIVITY LIMITATIONS: carrying, lifting, sleeping, bed mobility, bathing, toileting, dressing, self feeding, reach over head, hygiene/grooming, locomotion level, and caring for others  PARTICIPATION LIMITATIONS: meal prep, cleaning, laundry, driving, shopping, community activity, and yard work  PERSONAL FACTORS:  none  are also affecting patient's functional outcome.   REHAB POTENTIAL: Good  CLINICAL DECISION MAKING: Stable/uncomplicated  EVALUATION COMPLEXITY: Low  GOALS: Goals reviewed with patient? Yes   SHORT TERM GOALS: Target date: 09/26/2023     Pt to be  independent with initial HEP  Goal status: INITIAL  2.  Pt to demo ability for full PROM of L shoulder   Goal status: INITIAL    LONG TERM GOALS: Target date: 10/24/2023    Pt to be independent with final HEP  Goal status: INITIAL  2.  Pt to demo improved AROM to be [facility] and pain free, to improve ability for ADLs.   Goal status: INITIAL  3.  Pt to demo improved strength of L shoulder to be at least 4/5, to improve ability for IADLS  and ADLS.   Goal status: INITIAL  4.  Pt to demo ability for functional reach, lift, carry , up to 5 lb without deficits, for improved ability for IADLs and community activities.   Goal status: INITIAL    PLAN: PT FREQUENCY: 1-2x/week  PT DURATION: 8 weeks  PLANNED INTERVENTIONS: Therapeutic exercises, Therapeutic activity, Neuromuscular re-education, Patient/Family education, Self Care, Joint mobilization, Joint manipulation, Stair training, DME instructions, Aquatic Therapy, Dry Needling, Electrical stimulation, Cryotherapy, Moist heat, Taping, Ultrasound, Ionotophoresis 4mg /ml Dexamethasone , Manual therapy,  Vasopneumatic device, Traction, Spinal manipulation, Spinal mobilization,    PLAN FOR NEXT SESSION:  PROM, AAROM as tolerated    Samantha Becker, PT, DPT 2:20 PM  09/04/23

## 2023-09-09 ENCOUNTER — Encounter (HOSPITAL_BASED_OUTPATIENT_CLINIC_OR_DEPARTMENT_OTHER): Payer: Medicare Other | Admitting: Physical Therapy

## 2023-09-09 ENCOUNTER — Ambulatory Visit (INDEPENDENT_AMBULATORY_CARE_PROVIDER_SITE_OTHER): Payer: Medicare Other | Admitting: Physical Therapy

## 2023-09-09 ENCOUNTER — Ambulatory Visit (INDEPENDENT_AMBULATORY_CARE_PROVIDER_SITE_OTHER): Payer: Medicare Other | Admitting: Orthopaedic Surgery

## 2023-09-09 ENCOUNTER — Encounter: Payer: Self-pay | Admitting: Physical Therapy

## 2023-09-09 ENCOUNTER — Encounter: Payer: Self-pay | Admitting: Physician Assistant

## 2023-09-09 DIAGNOSIS — M25512 Pain in left shoulder: Secondary | ICD-10-CM

## 2023-09-09 DIAGNOSIS — M19019 Primary osteoarthritis, unspecified shoulder: Secondary | ICD-10-CM

## 2023-09-09 DIAGNOSIS — M19012 Primary osteoarthritis, left shoulder: Secondary | ICD-10-CM

## 2023-09-09 MED ORDER — PREMARIN 0.625 MG/GM VA CREA: TOPICAL_CREAM | VAGINAL | 2 refills | Status: AC

## 2023-09-09 NOTE — Progress Notes (Signed)
 Post Operative Evaluation    Procedure/Date of Surgery: Left shoulder AC joint debridement 12/30  Interval History:    Presents today 2 weeks status post left distal clavicle excision overall doing extremely well.  Range of motion is now essentially normalized aside from internal rotation behind the back.  There is some mild weakness although she feels like she is strengthening much improved compared to the contralateral side   PMH/PSH/Family History/Social History/Meds/Allergies:    Past Medical History:  Diagnosis Date   Arthritis    Chicken pox    GERD (gastroesophageal reflux disease)    OTC   Leaking of urine    Low iron    Mumps    Past Surgical History:  Procedure Laterality Date   CESAREAN SECTION     4 cs   LAPAROSCOPY  03/04/2012   Procedure: LAPAROSCOPY OPERATIVE;  Surgeon: [name], MD;  Location: [facility];  Service: Gynecology;  Laterality: N/A;  Peritoneum Biopsy   SHOULDER ARTHROSCOPY Right 03/20/2022   Procedure: RIGHT SHOULDER ARTHROSCOPY WITH DISTAL CLAVICLE RESECTION;  Surgeon: [name], MD;  Location: [facility];  Service: Orthopedics;  Laterality: Right;   TUBAL LIGATION Bilateral    Social History   Socioeconomic History   Marital status: Married    Spouse name: Not on file   Number of children: Not on file   Years of education: Not on file   Highest education level: Not on file  Occupational History   Occupation: TEACHER     Employer: [employer]  Tobacco Use   Smoking status: Never   Smokeless tobacco: Never  Vaping Use   Vaping status: Never Used  Substance and Sexual Activity   Alcohol use: Yes    Alcohol/week: 4.0 standard drinks of alcohol    Types: 2 Glasses of wine, 2 Cans of beer per week    Comment: wine 3-4 times a week   Drug use: No   Sexual activity: Yes    Birth control/protection: Surgical, Post-menopausal    Comment: BTL  Other Topics Concern   Not on file   Social History Narrative   Prior runner, broadcasting/film/video at [employer], now does tutoring -- once a week   Husband and her lives in home   Social Drivers of Health   Financial Resource Strain: Not on file  Food Insecurity: Not on file  Transportation Needs: Not on file  Physical Activity: Not on file  Stress: Not on file  Social Connections: Not on file   Family History  Problem Relation Age of Onset   Heart disease Mother    Hypertension Mother    Arthritis Mother    Breast cancer Mother    Osteoporosis Mother    Heart failure Mother    Heart disease Father    Hypertension Father    Cancer Father    Stroke Father    Arthritis Sister    Asthma Sister    Colon cancer Neg Hx    Esophageal cancer Neg Hx    Rectal cancer Neg Hx    Stomach cancer Neg Hx    Colon polyps Neg Hx    Allergies  Allergen Reactions   Morphine Nausea Only   Current Outpatient Medications  Medication Sig Dispense Refill   aspirin  EC 325 MG tablet Take 1 tablet (  325 mg total) by mouth daily. 14 tablet 0   Calcium Carb-Cholecalciferol (CALCIUM 500 + D PO) Take 2 each by mouth daily in the afternoon.     conjugated estrogens  (PREMARIN ) vaginal cream Premarin  0.625 mg/gram vaginal cream  Insert 0.5 applicatorsful twice a week by vaginal route. 42.5 g 2   ibuprofen  (ADVIL ) 200 MG tablet Take 800 mg by mouth every 6 (six) hours as needed.     tretinoin (RETIN-A) 0.025 % cream Apply 1 Application topically every evening.     triamcinolone  cream (KENALOG ) 0.1 % APPLY TO AFFECTED AREA 1-2 TIMES DAILY. 45 g 0   TURMERIC PO Take 3 tablets by mouth daily in the afternoon.     valsartan  (DIOVAN ) 80 MG tablet Take 1 tablet (80 mg total) by mouth daily. 30 tablet 5   No current facility-administered medications for this visit.   No results found.  Review of Systems:   A ROS was performed including pertinent positives and negatives as documented in the HPI.   Musculoskeletal Exam:    There were no vitals taken for  this visit.  Left shoulder incisions are well-appearing without erythema or drainage.  Forward active elevation is to 170 degrees with external rotation at side to 60 degrees.  Internal rotation is to L5 compared to T12 on the contralateral  Imaging:      I personally reviewed and interpreted the radiographs.   Assessment:   2 weeks status post left shoulder distal clavicle resection overall doing extremely well.  At this time she will continue to be activity as tolerated and continue to work on strengthening of the shoulder.  I will plan to see her back in 4 weeks for reassessment  Plan :    -Return to clinic 4 weeks for reassessment      I personally saw and evaluated the patient, and participated in the management and treatment plan.  Elspeth Parker, MD Attending Physician, Orthopedic Surgery  This document was dictated using Dragon voice recognition software. A reasonable attempt at proof reading has been made to minimize errors.

## 2023-09-09 NOTE — Therapy (Signed)
 OUTPATIENT PHYSICAL THERAPY UPPER EXTREMITY TREATMENT   Patient Name: Samantha Becker MRN: [id number] DOB:06/07/1956, 68 y.o., female Today's Date: 09/09/2023  END OF SESSION:  PT End of Session - 09/09/23 1323     Visit Number 4    Number of Visits 16    Date for PT Re-Evaluation 10/24/23    Authorization Type Medicare    PT Start Time 1304    PT Stop Time 1345    PT Time Calculation (min) 41 min    Activity Tolerance Patient tolerated treatment well    Behavior During Therapy WFL for tasks assessed/performed              Past Medical History:  Diagnosis Date   Arthritis    Chicken pox    GERD (gastroesophageal reflux disease)    OTC   Leaking of urine    Low iron    Mumps    Past Surgical History:  Procedure Laterality Date   CESAREAN SECTION     4 cs   LAPAROSCOPY  03/04/2012   Procedure: LAPAROSCOPY OPERATIVE;  Surgeon: [name], MD;  Location: [facility];  Service: Gynecology;  Laterality: N/A;  Peritoneum Biopsy   SHOULDER ARTHROSCOPY Right 03/20/2022   Procedure: RIGHT SHOULDER ARTHROSCOPY WITH DISTAL CLAVICLE RESECTION;  Surgeon: [name], MD;  Location: [facility];  Service: Orthopedics;  Laterality: Right;   TUBAL LIGATION Bilateral    Patient Active Problem List   Diagnosis Date Noted   Osteoarthritis of AC (acromioclavicular) joint    Endolymphatic hydrops of right ear 08/18/2020   Right-sided sensorineural hearing loss 08/18/2020   Atrophic vaginitis 03/20/2017   Endometriosis 03/18/2012   Dyspareunia 02/01/2012    PCP: [name]   REFERRING PROVIDER: [name]  REFERRING DIAG: L shoulder s/p arthroscopy, DCE and SAD  THERAPY DIAG:  Acute pain of left shoulder  Rationale for Evaluation and Treatment: Rehabilitation  ONSET DATE: Surgery 08/26/23  SUBJECTIVE:                                                                                                                                                                                       SUBJECTIVE STATEMENT:  09/09/2023 Pt states doing well, she had MD appt today , stitches taken out, steri strips in place.   Eval: L shoulder Arthrsoscopy 08/26/23 with DCE and SAD. She has MD f/u on Monday 09/09/23. She is doing fairly well, bandages in place today, has been icing. States difficulty sleeping due to positioning, has been sleeping propped up in bed. Had previous R shoulder scope last year, R shoulder doing very well a this time.   Hand  dominance: Right  PERTINENT HISTORY: None (previous R shoulder scope)   PAIN:  Are you having pain? Yes: NPRS scale: 0-4 /10 Pain location: L shoulder  Pain description: sore, painful Aggravating factors: sore at rest/post op, sleeping,  Relieving factors: none stated   PRECAUTIONS: None  RED FLAGS: None   WEIGHT BEARING RESTRICTIONS: No  FALLS:  Has patient fallen in last 6 months? No   PLOF: Independent  PATIENT GOALS: Decreased pain in shoulder, improved  use of arm.   NEXT MD VISIT:   OBJECTIVE:   DIAGNOSTIC FINDINGS:    PATIENT SURVEYS :   Eval: FOTO  22  COGNITION: Overall cognitive status: Within functional limits for tasks assessed     SENSATION: WFL  POSTURE:  UPPER EXTREMITY ROM:   Active  PROM Right eval Left eval Left 09/02/23:     Shoulder flexion  125 148    Shoulder extension       Shoulder abduction       Shoulder adduction       Shoulder internal rotation       Shoulder external rotation  45 60    Elbow flexion       Elbow extension       Wrist flexion       Wrist extension       Wrist ulnar deviation       Wrist radial deviation       Wrist pronation       Wrist supination       (Blank rows = not tested)   UPPER EXTREMITY MMT:   not tested due to surgery   MMT Right eval Left eval  Shoulder flexion    Shoulder extension    Shoulder abduction    Shoulder adduction    Shoulder internal rotation    Shoulder external rotation     Middle trapezius    Lower trapezius    Elbow flexion    Elbow extension    Wrist flexion    Wrist extension    Wrist ulnar deviation    Wrist radial deviation    Wrist pronation    Wrist supination    Grip strength (lbs)    (Blank rows = not tested)    JOINT MOBILITY TESTING:    PALPATION:    TODAY'S TREATMENT:                                                                                                                                         DATE:   09/09/2023  Therapeutic Exercise: Aerobic: Supine:  Shoulder aarom, with cane x 10; arom/full motion x 10;   ER butterfly x 10;     pec stretch 30 sec x 4 ;  Seated: pulley flexion and scaption  x 2 min ea ;  Standing:   wall slides 1 hand 2 x 10;   Scap squeeze x  15;  shoulder rolls x 10, shoulder IR behind back, 2 hand hold/light, 3 x 5;  Arom/scaption 2 x 10;  Stretches:  Neuromuscular Re-education: Manual Therapy: PROM flexion, abd, IR, ER, to tolerance.  Therapeutic Activity: Self Care:    Previous:  Therapeutic Exercise: Aerobic: Supine:  Shoulder aarom, 2 hand hold, press from table to 90 deg x 10;  Seated: Standing:  elbow ROM x 10 flex/ext;  wrist ROM x 10 flex/ext;  hand open/close x 15;  Pendulum L/R x 2 min;  Stretches:  Neuromuscular Re-education: Manual Therapy: Therapeutic Activity: Self Care: education on sling use and arm positioning with this, keeping incisions covered for showering, and restrictions for use/ROM at this time.    PATIENT EDUCATION:  Education details: updated and reviewed HEP Person educated: Patient Education method: Explanation, Demonstration, Tactile cues, Verbal cues, and Handouts Education comprehension: verbalized understanding, returned demonstration, verbal cues required, tactile cues required, and needs further education   HOME EXERCISE PROGRAM: Access Code: [email or web address] URL: [email or web address] Date: 08/29/2023 Prepared by: Tinnie Don  Exercises - Seated Wrist Extension AROM  - 3 x daily - 1-2 sets - 10 reps - Hand AROM Composite Flexion  - 3 x daily - 1-2 sets - 10 reps - Supine Shoulder Flexion AAROM with Hands Clasped  - 1-2 x daily - 1 sets - 10 reps  ASSESSMENT:  CLINICAL IMPRESSION: 09/09/2023 Pt  progressing well with less pain, and improving ability for arom today in both supine and standing positions. Plan to progress mobility and light strength as tolerated.   Eval:  Patient presents with deficits following L shoulder arthroscopy with SAD and DCE on 08/26/23. Bandages replaced with tegaderm today. She has limited ROM for prom and arom, as well as weakness in L shoulder. She has significant limitation in ability for any functional activity, lifting, carrying, ADLs or IADLS at this time due to surgery.  Pt to benefit from skilled PT to improve deficits and pain.    OBJECTIVE IMPAIRMENTS: decreased activity tolerance, decreased knowledge of use of DME, decreased mobility, decreased ROM, decreased strength, impaired UE functional use, and pain.   ACTIVITY LIMITATIONS: carrying, lifting, sleeping, bed mobility, bathing, toileting, dressing, self feeding, reach over head, hygiene/grooming, locomotion level, and caring for others  PARTICIPATION LIMITATIONS: meal prep, cleaning, laundry, driving, shopping, community activity, and yard work  PERSONAL FACTORS:  none  are also affecting patient's functional outcome.   REHAB POTENTIAL: Good  CLINICAL DECISION MAKING: Stable/uncomplicated  EVALUATION COMPLEXITY: Low  GOALS: Goals reviewed with patient? Yes   SHORT TERM GOALS: Target date: 09/26/2023     Pt to be independent with initial HEP  Goal status: INITIAL  2.  Pt to demo ability for full PROM of L shoulder   Goal status: INITIAL    LONG TERM GOALS: Target date: 10/24/2023    Pt to be independent with final HEP  Goal status: INITIAL  2.  Pt to demo improved AROM to be [facility] and pain  free, to improve ability for ADLs.   Goal status: INITIAL  3.  Pt to demo improved strength of L shoulder to be at least 4/5, to improve ability for IADLS and ADLS.   Goal status: INITIAL  4.  Pt to demo ability for functional reach, lift, carry , up to 5 lb without deficits, for improved ability for IADLs and community activities.   Goal status: INITIAL    PLAN: PT FREQUENCY: 1-2x/week  PT DURATION: 8 weeks  PLANNED INTERVENTIONS: Therapeutic exercises,  Therapeutic activity, Neuromuscular re-education, Patient/Family education, Self Care, Joint mobilization, Joint manipulation, Stair training, DME instructions, Aquatic Therapy, Dry Needling, Electrical stimulation, Cryotherapy, Moist heat, Taping, Ultrasound, Ionotophoresis 4mg /ml Dexamethasone , Manual therapy,  Vasopneumatic device, Traction, Spinal manipulation, Spinal mobilization,    PLAN FOR NEXT SESSION:  PROM, AAROM as tolerated    Tinnie Don, PT, DPT 5:01 PM  09/09/23

## 2023-09-11 ENCOUNTER — Encounter: Payer: Self-pay | Admitting: Physical Therapy

## 2023-09-11 ENCOUNTER — Ambulatory Visit (INDEPENDENT_AMBULATORY_CARE_PROVIDER_SITE_OTHER): Payer: Medicare Other | Admitting: Physical Therapy

## 2023-09-11 DIAGNOSIS — M25512 Pain in left shoulder: Secondary | ICD-10-CM | POA: Diagnosis not present

## 2023-09-11 NOTE — Therapy (Signed)
 OUTPATIENT PHYSICAL THERAPY UPPER EXTREMITY TREATMENT   Patient Name: Samantha Becker MRN: [id number] DOB:11-30-1955, 68 y.o., female Today's Date: 09/11/2023  END OF SESSION:  PT End of Session - 09/11/23 1108     Visit Number 5    Number of Visits 16    Date for PT Re-Evaluation 10/24/23    Authorization Type Medicare    PT Start Time 1106    PT Stop Time 1141    PT Time Calculation (min) 35 min    Activity Tolerance Patient tolerated treatment well    Behavior During Therapy WFL for tasks assessed/performed               Past Medical History:  Diagnosis Date   Arthritis    Chicken pox    GERD (gastroesophageal reflux disease)    OTC   Leaking of urine    Low iron    Mumps    Past Surgical History:  Procedure Laterality Date   CESAREAN SECTION     4 cs   LAPAROSCOPY  03/04/2012   Procedure: LAPAROSCOPY OPERATIVE;  Surgeon: Stevenson Elbe, MD;  Location: [facility];  Service: Gynecology;  Laterality: N/A;  Peritoneum Biopsy   SHOULDER ARTHROSCOPY Right 03/20/2022   Procedure: RIGHT SHOULDER ARTHROSCOPY WITH DISTAL CLAVICLE RESECTION;  Surgeon: Wilhelmenia Harada, MD;  Location: [facility];  Service: Orthopedics;  Laterality: Right;   TUBAL LIGATION Bilateral    Patient Active Problem List   Diagnosis Date Noted   Osteoarthritis of AC (acromioclavicular) joint    Endolymphatic hydrops of right ear 08/18/2020   Right-sided sensorineural hearing loss 08/18/2020   Atrophic vaginitis 03/20/2017   Endometriosis 03/18/2012   Dyspareunia 02/01/2012    PCP: Alexander Iba   REFERRING PROVIDER: Wilhelmenia Harada  REFERRING DIAG: L shoulder s/p arthroscopy, DCE and SAD  THERAPY DIAG:  Acute pain of left shoulder  Rationale for Evaluation and Treatment: Rehabilitation  ONSET DATE: Surgery 08/26/23  SUBJECTIVE:                                                                                                                                                                                       SUBJECTIVE STATEMENT:  09/11/2023 Pt states doing well. Very low pain. Still taking tylenol  daily.   Eval: L shoulder Arthrsoscopy 08/26/23 with DCE and SAD. She has MD f/u on Monday 09/09/23. She is doing fairly well, bandages in place today, has been icing. States difficulty sleeping due to positioning, has been sleeping propped up in bed. Had previous R shoulder scope last year, R shoulder doing very well a this time.   Hand dominance: Right  PERTINENT HISTORY:  None (previous R shoulder scope)   PAIN:  Are you having pain? Yes: NPRS scale: 0-3 /10 Pain location: L shoulder  Pain description: sore, painful Aggravating factors: sore at rest/post op, sleeping,  Relieving factors: none stated   PRECAUTIONS: None  RED FLAGS: None   WEIGHT BEARING RESTRICTIONS: No  FALLS:  Has patient fallen in last 6 months? No   PLOF: Independent  PATIENT GOALS: Decreased pain in shoulder, improved  use of arm.   NEXT MD VISIT:   OBJECTIVE:   DIAGNOSTIC FINDINGS:    PATIENT SURVEYS :   Eval:  FOTO  22 Visit  5:   68  COGNITION: Overall cognitive status: Within functional limits for tasks assessed     SENSATION: WFL  POSTURE:  UPPER EXTREMITY ROM:   Active  PROM Right eval Left eval Left 09/02/23:     Shoulder flexion  125 148    Shoulder extension       Shoulder abduction       Shoulder adduction       Shoulder internal rotation       Shoulder external rotation  45 60    Elbow flexion       Elbow extension       Wrist flexion       Wrist extension       Wrist ulnar deviation       Wrist radial deviation       Wrist pronation       Wrist supination       (Blank rows = not tested)   UPPER EXTREMITY MMT:   not tested due to surgery   MMT Right eval Left eval  Shoulder flexion    Shoulder extension    Shoulder abduction    Shoulder adduction    Shoulder internal rotation    Shoulder external rotation    Middle  trapezius    Lower trapezius    Elbow flexion    Elbow extension    Wrist flexion    Wrist extension    Wrist ulnar deviation    Wrist radial deviation    Wrist pronation    Wrist supination    Grip strength (lbs)    (Blank rows = not tested)    JOINT MOBILITY TESTING:    PALPATION:    TODAY'S TREATMENT:                                                                                                                                         DATE:   09/11/2023  Therapeutic Exercise: Aerobic: Supine:   S/L:  ER 2 x 10;   Seated:  pulley flexion and scaption  x 2 min ea ;  Standing:   wall slides 1 hand 2 x 10; shoulder IR behind back, 2 hand hold/light, 3 x 5;    Scap squeeze x 10;   Rows GTB  2 x 10;   Bil ER- arom: x 10;   bil ER- Strength- YTB(small ROM) x 15;  Arom:  flexion, scaption, abduction ,  x 10  each  Stretches:  Neuromuscular Re-education: Manual Therapy: PROM flexion, abd, IR, ER,  Therapeutic Activity: Self Care:      PATIENT EDUCATION:  Education details: updated and reviewed HEP Person educated: Patient Education method: Explanation, Demonstration, Tactile cues, Verbal cues, and Handouts Education comprehension: verbalized understanding, returned demonstration, verbal cues required, tactile cues required, and needs further education   HOME EXERCISE PROGRAM: Access Code: [email or web address]   ASSESSMENT:  CLINICAL IMPRESSION: 09/11/2023 Pt  progressing well with less pain, and improving ability for arom. Near full AROM today for elevation. FOTO score improved 30 points today. Started ER strength in mid-range position, pt with good tolerance and no pain. Plan to progress AROM and light strength as able.   Eval:  Patient presents with deficits following L shoulder arthroscopy with SAD and DCE on 08/26/23. Bandages replaced with tegaderm today. She has limited ROM for prom and arom, as well as weakness in L shoulder. She has significant limitation in ability  for any functional activity, lifting, carrying, ADLs or IADLS at this time due to surgery.  Pt to benefit from skilled PT to improve deficits and pain.    OBJECTIVE IMPAIRMENTS: decreased activity tolerance, decreased knowledge of use of DME, decreased mobility, decreased ROM, decreased strength, impaired UE functional use, and pain.   ACTIVITY LIMITATIONS: carrying, lifting, sleeping, bed mobility, bathing, toileting, dressing, self feeding, reach over head, hygiene/grooming, locomotion level, and caring for others  PARTICIPATION LIMITATIONS: meal prep, cleaning, laundry, driving, shopping, community activity, and yard work  PERSONAL FACTORS:  none  are also affecting patient's functional outcome.   REHAB POTENTIAL: Good  CLINICAL DECISION MAKING: Stable/uncomplicated  EVALUATION COMPLEXITY: Low  GOALS: Goals reviewed with patient? Yes   SHORT TERM GOALS: Target date: 09/26/2023   Pt to be independent with initial HEP  Goal status: MET  2.  Pt to demo ability for full PROM of L shoulder   Goal status: INITIAL    LONG TERM GOALS: Target date: 10/24/2023   Pt to be independent with final HEP  Goal status: INITIAL  2.  Pt to demo improved AROM to be [facility] and pain free, to improve ability for ADLs.   Goal status: INITIAL  3.  Pt to demo improved strength of L shoulder to be at least 4/5, to improve ability for IADLS and ADLS.   Goal status: INITIAL  4.  Pt to demo ability for functional reach, lift, carry , up to 5 lb without deficits, for improved ability for IADLs and community activities.   Goal status: INITIAL    PLAN: PT FREQUENCY: 1-2x/week  PT DURATION: 8 weeks  PLANNED INTERVENTIONS: Therapeutic exercises, Therapeutic activity, Neuromuscular re-education, Patient/Family education, Self Care, Joint mobilization, Joint manipulation, Stair training, DME instructions, Aquatic Therapy, Dry Needling, Electrical stimulation, Cryotherapy, Moist heat, Taping,  Ultrasound, Ionotophoresis 4mg /ml Dexamethasone , Manual therapy,  Vasopneumatic device, Traction, Spinal manipulation, Spinal mobilization,    PLAN FOR NEXT SESSION:  PROM, AAROM as tolerated    Terrilee Few, PT, DPT 11:43 AM  09/11/23

## 2023-09-16 ENCOUNTER — Ambulatory Visit (INDEPENDENT_AMBULATORY_CARE_PROVIDER_SITE_OTHER): Payer: Medicare Other | Admitting: Physical Therapy

## 2023-09-16 ENCOUNTER — Encounter: Payer: Self-pay | Admitting: Physical Therapy

## 2023-09-16 ENCOUNTER — Encounter (HOSPITAL_BASED_OUTPATIENT_CLINIC_OR_DEPARTMENT_OTHER): Payer: Medicare Other | Admitting: Physical Therapy

## 2023-09-16 DIAGNOSIS — M25551 Pain in right hip: Secondary | ICD-10-CM | POA: Diagnosis not present

## 2023-09-16 DIAGNOSIS — M25511 Pain in right shoulder: Secondary | ICD-10-CM | POA: Diagnosis not present

## 2023-09-16 DIAGNOSIS — M25512 Pain in left shoulder: Secondary | ICD-10-CM | POA: Diagnosis not present

## 2023-09-16 NOTE — Therapy (Signed)
OUTPATIENT PHYSICAL THERAPY UPPER EXTREMITY TREATMENT   Patient Name: Samantha Becker MRN: [id number] DOB:Nov 08, 1955, 68 y.o., female Today's Date: 09/16/2023  END OF SESSION:  PT End of Session - 09/16/23 0849     Visit Number 6    Number of Visits 16    Date for PT Re-Evaluation 10/24/23    Authorization Type Medicare    PT Start Time 0850    PT Stop Time 0930    PT Time Calculation (min) 40 min    Activity Tolerance Patient tolerated treatment well    Behavior During Therapy WFL for tasks assessed/performed               Past Medical History:  Diagnosis Date   Arthritis    Chicken pox    GERD (gastroesophageal reflux disease)    OTC   Leaking of urine    Low iron    Mumps    Past Surgical History:  Procedure Laterality Date   CESAREAN SECTION     4 cs   LAPAROSCOPY  03/04/2012   Procedure: LAPAROSCOPY OPERATIVE;  Surgeon: Hal Morales, MD;  Location: [facility];  Service: Gynecology;  Laterality: N/A;  Peritoneum Biopsy   SHOULDER ARTHROSCOPY Right 03/20/2022   Procedure: RIGHT SHOULDER ARTHROSCOPY WITH DISTAL CLAVICLE RESECTION;  Surgeon: Huel Cote, MD;  Location: [facility];  Service: Orthopedics;  Laterality: Right;   TUBAL LIGATION Bilateral    Patient Active Problem List   Diagnosis Date Noted   Osteoarthritis of AC (acromioclavicular) joint    Endolymphatic hydrops of right ear 08/18/2020   Right-sided sensorineural hearing loss 08/18/2020   Atrophic vaginitis 03/20/2017   Endometriosis 03/18/2012   Dyspareunia 02/01/2012    PCP: Jarold Motto   REFERRING PROVIDER: Huel Cote  REFERRING DIAG: L shoulder s/p arthroscopy, DCE and SAD  THERAPY DIAG:  Acute pain of left shoulder  Pain in right hip  Acute pain of right shoulder  Rationale for Evaluation and Treatment: Rehabilitation  ONSET DATE: Surgery 08/26/23  SUBJECTIVE:                                                                                                                                                                                       SUBJECTIVE STATEMENT:  09/16/2023 States she had some pain in the front of her shoulder it felt like someone pushed her shoulder back. No reports of shoulder   Eval: L shoulder Arthrsoscopy 08/26/23 with DCE and SAD. She has MD f/u on Monday 09/09/23. She is doing fairly well, bandages in place today, has been icing. States difficulty sleeping due to positioning, has been sleeping propped up in bed.  Had previous R shoulder scope last year, R shoulder doing very well a this time.   Hand dominance: Right  PERTINENT HISTORY: None (previous R shoulder scope)   PAIN:  Are you having pain? Yes: NPRS scale:5/10 Pain location: L shoulder  anterior Pain description: sore, painful Aggravating factors: sore at rest/post op, sleeping,  Relieving factors: none stated   PRECAUTIONS: None  RED FLAGS: None   WEIGHT BEARING RESTRICTIONS: No  FALLS:  Has patient fallen in last 6 months? No   PLOF: Independent  PATIENT GOALS: Decreased pain in shoulder, improved  use of arm.   NEXT MD VISIT:   OBJECTIVE:   DIAGNOSTIC FINDINGS:    PATIENT SURVEYS :   Eval:  FOTO  22 Visit  5:   3  COGNITION: Overall cognitive status: Within functional limits for tasks assessed     SENSATION: WFL  POSTURE:  UPPER EXTREMITY ROM:   Active  PROM Right eval Left eval Left 09/02/23:     Shoulder flexion  125 148    Shoulder extension       Shoulder abduction       Shoulder adduction       Shoulder internal rotation       Shoulder external rotation  45 60    Elbow flexion       Elbow extension       Wrist flexion       Wrist extension       Wrist ulnar deviation       Wrist radial deviation       Wrist pronation       Wrist supination       (Blank rows = not tested)   UPPER EXTREMITY MMT:   not tested due to surgery   MMT Right eval Left eval  Shoulder flexion    Shoulder extension     Shoulder abduction    Shoulder adduction    Shoulder internal rotation    Shoulder external rotation    Middle trapezius    Lower trapezius    Elbow flexion    Elbow extension    Wrist flexion    Wrist extension    Wrist ulnar deviation    Wrist radial deviation    Wrist pronation    Wrist supination    Grip strength (lbs)    (Blank rows = not tested)    JOINT MOBILITY TESTING:    PALPATION:    TODAY'S TREATMENT:                                                                                                                                         DATE:   09/16/2023  Therapeutic Exercise: Aerobic: Supine:  PROM of left shoulder 5 minutes, PROM scapular motion into protraction S/L:  ER 2 x 10;   Seated:  pulley flexion and scaption  x 2 min ea ;  shoulder circles x10 fwd and backwd. Standing:   wall slides 2 hands flexion with pillowcase- 2 minutes Scap squeeze x 10;   Rows GTB 2 x 15;     Stretches:  Neuromuscular Re-education: Manual Therapy: STM to left arm and armpit - tolerated well, scapular mobilization. 10 minutes total  Therapeutic Activity: Self Care:      PATIENT EDUCATION:  Education details: updated and reviewed HEP Person educated: Patient Education method: Explanation, Demonstration, Tactile cues, Verbal cues, and Handouts Education comprehension: verbalized understanding, returned demonstration, verbal cues required, tactile cues required, and needs further education   HOME EXERCISE PROGRAM: Access Code: [email or web address]   ASSESSMENT:  CLINICAL IMPRESSION: 09/16/2023 After STM pain reported 3/10. Continued stiffness with pulleys but this reduced afterwards. Tolerated PROM well with less pain noted afterwards. Difficulty for patient to perform protraction but improved with PT assist. Overall reduced symptoms noted end of session. Educated patient on gentle lymph massage. Will continue with current POC as tolerated.   Eval:  Patient presents with  deficits following L shoulder arthroscopy with SAD and DCE on 08/26/23. Bandages replaced with tegaderm today. She has limited ROM for prom and arom, as well as weakness in L shoulder. She has significant limitation in ability for any functional activity, lifting, carrying, ADLs or IADLS at this time due to surgery.  Pt to benefit from skilled PT to improve deficits and pain.    OBJECTIVE IMPAIRMENTS: decreased activity tolerance, decreased knowledge of use of DME, decreased mobility, decreased ROM, decreased strength, impaired UE functional use, and pain.   ACTIVITY LIMITATIONS: carrying, lifting, sleeping, bed mobility, bathing, toileting, dressing, self feeding, reach over head, hygiene/grooming, locomotion level, and caring for others  PARTICIPATION LIMITATIONS: meal prep, cleaning, laundry, driving, shopping, community activity, and yard work  PERSONAL FACTORS:  none  are also affecting patient's functional outcome.   REHAB POTENTIAL: Good  CLINICAL DECISION MAKING: Stable/uncomplicated  EVALUATION COMPLEXITY: Low  GOALS: Goals reviewed with patient? Yes   SHORT TERM GOALS: Target date: 09/26/2023   Pt to be independent with initial HEP  Goal status: MET  2.  Pt to demo ability for full PROM of L shoulder   Goal status: INITIAL    LONG TERM GOALS: Target date: 10/24/2023   Pt to be independent with final HEP  Goal status: INITIAL  2.  Pt to demo improved AROM to be [facility] and pain free, to improve ability for ADLs.   Goal status: INITIAL  3.  Pt to demo improved strength of L shoulder to be at least 4/5, to improve ability for IADLS and ADLS.   Goal status: INITIAL  4.  Pt to demo ability for functional reach, lift, carry , up to 5 lb without deficits, for improved ability for IADLs and community activities.   Goal status: INITIAL    PLAN: PT FREQUENCY: 1-2x/week  PT DURATION: 8 weeks  PLANNED INTERVENTIONS: Therapeutic exercises, Therapeutic activity,  Neuromuscular re-education, Patient/Family education, Self Care, Joint mobilization, Joint manipulation, Stair training, DME instructions, Aquatic Therapy, Dry Needling, Electrical stimulation, Cryotherapy, Moist heat, Taping, Ultrasound, Ionotophoresis 4mg /ml Dexamethasone, Manual therapy,  Vasopneumatic device, Traction, Spinal manipulation, Spinal mobilization,    PLAN FOR NEXT SESSION:  PROM, AAROM as tolerated    9:52 AM, 09/16/23 Tereasa Coop, DPT Physical Therapy with [facility]

## 2023-09-17 ENCOUNTER — Ambulatory Visit (AMBULATORY_SURGERY_CENTER): Payer: Medicare Other

## 2023-09-17 VITALS — Ht 62.0 in | Wt 135.0 lb

## 2023-09-17 DIAGNOSIS — Z1211 Encounter for screening for malignant neoplasm of colon: Secondary | ICD-10-CM | POA: Insufficient documentation

## 2023-09-17 NOTE — Progress Notes (Signed)

## 2023-09-18 ENCOUNTER — Encounter: Payer: Medicare Other | Admitting: Physical Therapy

## 2023-09-18 ENCOUNTER — Encounter: Payer: Self-pay | Admitting: Physical Therapy

## 2023-09-18 ENCOUNTER — Ambulatory Visit (INDEPENDENT_AMBULATORY_CARE_PROVIDER_SITE_OTHER): Payer: Medicare Other | Admitting: Physical Therapy

## 2023-09-18 DIAGNOSIS — M25512 Pain in left shoulder: Secondary | ICD-10-CM

## 2023-09-18 DIAGNOSIS — M25511 Pain in right shoulder: Secondary | ICD-10-CM

## 2023-09-18 DIAGNOSIS — M25551 Pain in right hip: Secondary | ICD-10-CM | POA: Diagnosis not present

## 2023-09-18 NOTE — Therapy (Signed)
OUTPATIENT PHYSICAL THERAPY UPPER EXTREMITY TREATMENT   Patient Name: CALLYSTA STRICKLIN MRN: [id number] DOB:01/09/1956, 68 y.o., female Today's Date: 09/18/2023  END OF SESSION:  PT End of Session - 09/18/23 1103     Visit Number 7    Number of Visits 16    Date for PT Re-Evaluation 10/24/23    Authorization Type Medicare    PT Start Time 1105    PT Stop Time 1143    PT Time Calculation (min) 38 min    Activity Tolerance Patient tolerated treatment well    Behavior During Therapy WFL for tasks assessed/performed               Past Medical History:  Diagnosis Date   Arthritis    Chicken pox    GERD (gastroesophageal reflux disease)    OTC   Hypertension    Leaking of urine    Low iron    Mumps    Past Surgical History:  Procedure Laterality Date   CESAREAN SECTION     4 cs   LAPAROSCOPY  03/04/2012   Procedure: LAPAROSCOPY OPERATIVE;  Surgeon: Hal Morales, MD;  Location: [facility];  Service: Gynecology;  Laterality: N/A;  Peritoneum Biopsy   SHOULDER ARTHROSCOPY Right 03/20/2022   Procedure: RIGHT SHOULDER ARTHROSCOPY WITH DISTAL CLAVICLE RESECTION;  Surgeon: Huel Cote, MD;  Location: [facility];  Service: Orthopedics;  Laterality: Right;   TUBAL LIGATION Bilateral    Patient Active Problem List   Diagnosis Date Noted   Special screening for malignant neoplasms, colon 09/17/2023   Osteoarthritis of AC (acromioclavicular) joint    Endolymphatic hydrops of right ear 08/18/2020   Right-sided sensorineural hearing loss 08/18/2020   Atrophic vaginitis 03/20/2017   Endometriosis 03/18/2012   Dyspareunia 02/01/2012    PCP: Jarold Motto   REFERRING PROVIDER: Huel Cote  REFERRING DIAG: L shoulder s/p arthroscopy, DCE and SAD  THERAPY DIAG:  Acute pain of left shoulder  Acute pain of right shoulder  Pain in right hip  Rationale for Evaluation and Treatment: Rehabilitation  ONSET DATE: Surgery 08/26/23  SUBJECTIVE:                                                                                                                                                                                       SUBJECTIVE STATEMENT:  09/18/2023 Felt better after last session and no difficulties with exercises. State she has bene starting with heat in the morning.   Eval: L shoulder Arthrsoscopy 08/26/23 with DCE and SAD. She has MD f/u on Monday 09/09/23. She is doing fairly well, bandages in place today, has been icing. States difficulty sleeping  due to positioning, has been sleeping propped up in bed. Had previous R shoulder scope last year, R shoulder doing very well a this time.   Hand dominance: Right  PERTINENT HISTORY: None (previous R shoulder scope)   PAIN:  Are you having pain? Yes: NPRS scale:3/10 Pain location: L shoulder  anterior Pain description: sore, painful Aggravating factors: sore at rest/post op, sleeping,  Relieving factors: none stated   PRECAUTIONS: None  RED FLAGS: None   WEIGHT BEARING RESTRICTIONS: No  FALLS:  Has patient fallen in last 6 months? No   PLOF: Independent  PATIENT GOALS: Decreased pain in shoulder, improved  use of arm.   NEXT MD VISIT:   OBJECTIVE:   DIAGNOSTIC FINDINGS:    PATIENT SURVEYS :   Eval:  FOTO  22 Visit  5:   55  COGNITION: Overall cognitive status: Within functional limits for tasks assessed     SENSATION: WFL  POSTURE:  UPPER EXTREMITY ROM:   Active  PROM Right eval Left eval Left 09/02/23:  Left 09/17/22   Shoulder flexion  125 148 170 (standing)   Shoulder extension       Shoulder abduction       Shoulder adduction       Shoulder internal rotation       Shoulder external rotation  45 60 80 (at 90 deg abd)   Elbow flexion       Elbow extension       Wrist flexion       Wrist extension       Wrist ulnar deviation       Wrist radial deviation       Wrist pronation       Wrist supination       (Blank rows = not  tested)   UPPER EXTREMITY MMT:   not tested due to surgery   MMT Right eval Left eval  Shoulder flexion    Shoulder extension    Shoulder abduction    Shoulder adduction    Shoulder internal rotation    Shoulder external rotation    Middle trapezius    Lower trapezius    Elbow flexion    Elbow extension    Wrist flexion    Wrist extension    Wrist ulnar deviation    Wrist radial deviation    Wrist pronation    Wrist supination    Grip strength (lbs)    (Blank rows = not tested)    JOINT MOBILITY TESTING:    PALPATION:    TODAY'S TREATMENT:                                                                                                                                         DATE:   09/18/2023  Therapeutic Exercise: Aerobic: Supine:  PROM of left shoulder 8 minutes, PROM scapular motion into protraction, scapular protraction AROM x20  5" holds S/L:    Seated:  Standing:    shoulder flexion with posterior wall support 2x10 slow and controlled B   Stretches:  Neuromuscular Re-education: long exhale breathing - 8 minutes, posterior lateral breathing - 8 minutes Manual Therapy: STM to left arm and scapular muscles - tolerated well, gentle scapular movements -tolerated well  Therapeutic Activity: Self Care:   Thermotherapy- to left shoulder in supine during supine interventions    PATIENT EDUCATION:  Education details: updated and reviewed HEP Person educated: Patient Education method: Explanation, Demonstration, Tactile cues, Verbal cues, and Handouts Education comprehension: verbalized understanding, returned demonstration, verbal cues required, tactile cues required, and needs further education   HOME EXERCISE PROGRAM: Access Code: [email or web address]   ASSESSMENT:  CLINICAL IMPRESSION: 09/18/2023 Focused on soft tissue work and scapular mobility. Tolerated well. Improved shoulder ROM noted after breathing exercises. Added these to HEP. Overall patient doing well  demonstrating full shoulder flexion standing without scapular compensation. Reduced pain end of session noting 1/10. Will continue to progress as tolerated.   Eval:  Patient presents with deficits following L shoulder arthroscopy with SAD and DCE on 08/26/23. Bandages replaced with tegaderm today. She has limited ROM for prom and arom, as well as weakness in L shoulder. She has significant limitation in ability for any functional activity, lifting, carrying, ADLs or IADLS at this time due to surgery.  Pt to benefit from skilled PT to improve deficits and pain.    OBJECTIVE IMPAIRMENTS: decreased activity tolerance, decreased knowledge of use of DME, decreased mobility, decreased ROM, decreased strength, impaired UE functional use, and pain.   ACTIVITY LIMITATIONS: carrying, lifting, sleeping, bed mobility, bathing, toileting, dressing, self feeding, reach over head, hygiene/grooming, locomotion level, and caring for others  PARTICIPATION LIMITATIONS: meal prep, cleaning, laundry, driving, shopping, community activity, and yard work  PERSONAL FACTORS:  none  are also affecting patient's functional outcome.   REHAB POTENTIAL: Good  CLINICAL DECISION MAKING: Stable/uncomplicated  EVALUATION COMPLEXITY: Low  GOALS: Goals reviewed with patient? Yes   SHORT TERM GOALS: Target date: 09/26/2023   Pt to be independent with initial HEP  Goal status: MET  2.  Pt to demo ability for full PROM of L shoulder   Goal status: INITIAL    LONG TERM GOALS: Target date: 10/24/2023   Pt to be independent with final HEP  Goal status: INITIAL  2.  Pt to demo improved AROM to be [facility] and pain free, to improve ability for ADLs.   Goal status: INITIAL  3.  Pt to demo improved strength of L shoulder to be at least 4/5, to improve ability for IADLS and ADLS.   Goal status: INITIAL  4.  Pt to demo ability for functional reach, lift, carry , up to 5 lb without deficits, for improved ability for  IADLs and community activities.   Goal status: INITIAL    PLAN: PT FREQUENCY: 1-2x/week  PT DURATION: 8 weeks  PLANNED INTERVENTIONS: Therapeutic exercises, Therapeutic activity, Neuromuscular re-education, Patient/Family education, Self Care, Joint mobilization, Joint manipulation, Stair training, DME instructions, Aquatic Therapy, Dry Needling, Electrical stimulation, Cryotherapy, Moist heat, Taping, Ultrasound, Ionotophoresis 4mg /ml Dexamethasone, Manual therapy,  Vasopneumatic device, Traction, Spinal manipulation, Spinal mobilization,    PLAN FOR NEXT SESSION:  PROM, AAROM as tolerated    11:50 AM, 09/18/23 Tereasa Coop, DPT Physical Therapy with [facility]

## 2023-09-23 ENCOUNTER — Ambulatory Visit (INDEPENDENT_AMBULATORY_CARE_PROVIDER_SITE_OTHER): Payer: Medicare Other | Admitting: Physical Therapy

## 2023-09-23 ENCOUNTER — Encounter (HOSPITAL_BASED_OUTPATIENT_CLINIC_OR_DEPARTMENT_OTHER): Payer: Medicare Other | Admitting: Physical Therapy

## 2023-09-23 ENCOUNTER — Encounter: Payer: Self-pay | Admitting: Physical Therapy

## 2023-09-23 DIAGNOSIS — M25512 Pain in left shoulder: Secondary | ICD-10-CM

## 2023-09-23 NOTE — Therapy (Signed)
OUTPATIENT PHYSICAL THERAPY UPPER EXTREMITY TREATMENT   Patient Name: Samantha Becker MRN: [id number] DOB:1956/07/02, 68 y.o., female Today's Date: 09/23/2023  END OF SESSION:  PT End of Session - 09/23/23 1225     Visit Number 8    Number of Visits 16    Date for PT Re-Evaluation 10/24/23    Authorization Type Medicare    PT Start Time 1225    PT Stop Time 1300    PT Time Calculation (min) 35 min    Activity Tolerance Patient tolerated treatment well    Behavior During Therapy WFL for tasks assessed/performed               Past Medical History:  Diagnosis Date   Arthritis    Chicken pox    GERD (gastroesophageal reflux disease)    OTC   Hypertension    Leaking of urine    Low iron    Mumps    Past Surgical History:  Procedure Laterality Date   CESAREAN SECTION     4 cs   LAPAROSCOPY  03/04/2012   Procedure: LAPAROSCOPY OPERATIVE;  Surgeon: Hal Morales, MD;  Location: [facility];  Service: Gynecology;  Laterality: N/A;  Peritoneum Biopsy   SHOULDER ARTHROSCOPY Right 03/20/2022   Procedure: RIGHT SHOULDER ARTHROSCOPY WITH DISTAL CLAVICLE RESECTION;  Surgeon: Huel Cote, MD;  Location: [facility];  Service: Orthopedics;  Laterality: Right;   TUBAL LIGATION Bilateral    Patient Active Problem List   Diagnosis Date Noted   Special screening for malignant neoplasms, colon 09/17/2023   Osteoarthritis of AC (acromioclavicular) joint    Endolymphatic hydrops of right ear 08/18/2020   Right-sided sensorineural hearing loss 08/18/2020   Atrophic vaginitis 03/20/2017   Endometriosis 03/18/2012   Dyspareunia 02/01/2012    PCP: Jarold Motto   REFERRING PROVIDER: Huel Cote  REFERRING DIAG: L shoulder s/p arthroscopy, DCE and SAD  THERAPY DIAG:  Acute pain of left shoulder  Rationale for Evaluation and Treatment: Rehabilitation  ONSET DATE: Surgery 08/26/23  SUBJECTIVE:                                                                                                                                                                                       SUBJECTIVE STATEMENT:  09/23/2023 Pt states doing well. Still having to take advil 2x/day. 4 weeks post op today.   Eval: L shoulder Arthrsoscopy 08/26/23 with DCE and SAD. She has MD f/u on Monday 09/09/23. She is doing fairly well, bandages in place today, has been icing. States difficulty sleeping due to positioning, has been sleeping propped up in bed. Had previous R shoulder scope last  year, R shoulder doing very well a this time.   Hand dominance: Right  PERTINENT HISTORY: None (previous R shoulder scope)   PAIN:  Are you having pain? Yes: NPRS scale: 3/10 Pain location: L shoulder  anterior Pain description: sore, painful Aggravating factors: sore at rest/post op, sleeping,  Relieving factors: none stated   PRECAUTIONS: None  RED FLAGS: None   WEIGHT BEARING RESTRICTIONS: No  FALLS:  Has patient fallen in last 6 months? No   PLOF: Independent  PATIENT GOALS: Decreased pain in shoulder, improved  use of arm.   NEXT MD VISIT:   OBJECTIVE:   DIAGNOSTIC FINDINGS:    PATIENT SURVEYS :   Eval:  FOTO  22 Visit  5:   68  COGNITION: Overall cognitive status: Within functional limits for tasks assessed     SENSATION: WFL  POSTURE:  UPPER EXTREMITY ROM:   Active  PROM Right eval Left eval Left 09/02/23:  Left 09/17/22   Shoulder flexion  125 148 170 (standing)   Shoulder extension       Shoulder abduction       Shoulder adduction       Shoulder internal rotation       Shoulder external rotation  45 60 80 (at 90 deg abd)   Elbow flexion       Elbow extension       Wrist flexion       Wrist extension       Wrist ulnar deviation       Wrist radial deviation       Wrist pronation       Wrist supination       (Blank rows = not tested)   UPPER EXTREMITY MMT:   not tested due to surgery   MMT Right eval Left eval  Shoulder  flexion    Shoulder extension    Shoulder abduction    Shoulder adduction    Shoulder internal rotation    Shoulder external rotation    Middle trapezius    Lower trapezius    Elbow flexion    Elbow extension    Wrist flexion    Wrist extension    Wrist ulnar deviation    Wrist radial deviation    Wrist pronation    Wrist supination    Grip strength (lbs)    (Blank rows = not tested)    JOINT MOBILITY TESTING:    PALPATION:    TODAY'S TREATMENT:                                                                                                                                         DATE:   09/23/2023  Therapeutic Exercise: Aerobic: Supine:  AROM/flexion  x20 5" holds S/L:    Seated:  pulley x 15 flex and scaption.  Standing:    wall slides x 15 on L;  shoulder ER YTB 2 x 15;  shoulder flexion with posterior wall support 2x10 slow and controlled B Stretches:  lat stretch at railing x 3;  Neuromuscular Re-education: Manual Therapy: PROM for shoulder flex and ER.  Therapeutic Activity: Self Care:    PATIENT EDUCATION:  Education details: updated and reviewed HEP Person educated: Patient Education method: Explanation, Demonstration, Tactile cues, Verbal cues, and Handouts Education comprehension: verbalized understanding, returned demonstration, verbal cues required, tactile cues required, and needs further education   HOME EXERCISE PROGRAM: Access Code: [email or web address]   ASSESSMENT:  CLINICAL IMPRESSION: 09/23/2023 Pt progressing well. Improved ability for full flexion ROM in supine position, still mild limitation for standing position today. Will continue to work on light scapular and shoulder strengthening and achieving full AROM. Pt still taking advil 2x/day for soreness.   Eval:  Patient presents with deficits following L shoulder arthroscopy with SAD and DCE on 08/26/23. Bandages replaced with tegaderm today. She has limited ROM for prom and arom, as well as  weakness in L shoulder. She has significant limitation in ability for any functional activity, lifting, carrying, ADLs or IADLS at this time due to surgery.  Pt to benefit from skilled PT to improve deficits and pain.    OBJECTIVE IMPAIRMENTS: decreased activity tolerance, decreased knowledge of use of DME, decreased mobility, decreased ROM, decreased strength, impaired UE functional use, and pain.   ACTIVITY LIMITATIONS: carrying, lifting, sleeping, bed mobility, bathing, toileting, dressing, self feeding, reach over head, hygiene/grooming, locomotion level, and caring for others  PARTICIPATION LIMITATIONS: meal prep, cleaning, laundry, driving, shopping, community activity, and yard work  PERSONAL FACTORS:  none  are also affecting patient's functional outcome.   REHAB POTENTIAL: Good  CLINICAL DECISION MAKING: Stable/uncomplicated  EVALUATION COMPLEXITY: Low  GOALS: Goals reviewed with patient? Yes   SHORT TERM GOALS: Target date: 09/26/2023   Pt to be independent with initial HEP  Goal status: MET  2.  Pt to demo ability for full PROM of L shoulder   Goal status: partially met     LONG TERM GOALS: Target date: 10/24/2023   Pt to be independent with final HEP  Goal status: partially met   2.  Pt to demo improved AROM to be [facility] and pain free, to improve ability for ADLs.   Goal status: partially met   3.  Pt to demo improved strength of L shoulder to be at least 4/5, to improve ability for IADLS and ADLS.   Goal status: partially met   4.  Pt to demo ability for functional reach, lift, carry , up to 5 lb without deficits, for improved ability for IADLs and community activities.   Goal status: partially met     PLAN: PT FREQUENCY: 1-2x/week  PT DURATION: 8 weeks  PLANNED INTERVENTIONS: Therapeutic exercises, Therapeutic activity, Neuromuscular re-education, Patient/Family education, Self Care, Joint mobilization, Joint manipulation, Stair training, DME  instructions, Aquatic Therapy, Dry Needling, Electrical stimulation, Cryotherapy, Moist heat, Taping, Ultrasound, Ionotophoresis 4mg /ml Dexamethasone, Manual therapy,  Vasopneumatic device, Traction, Spinal manipulation, Spinal mobilization,    PLAN FOR NEXT SESSION:  PROM, AAROM as tolerated    Sedalia Muta, PT, DPT 12:59 PM  09/23/23

## 2023-09-25 ENCOUNTER — Encounter: Payer: Self-pay | Admitting: Physical Therapy

## 2023-09-25 ENCOUNTER — Ambulatory Visit (INDEPENDENT_AMBULATORY_CARE_PROVIDER_SITE_OTHER): Payer: Medicare Other | Admitting: Physical Therapy

## 2023-09-25 ENCOUNTER — Encounter (HOSPITAL_BASED_OUTPATIENT_CLINIC_OR_DEPARTMENT_OTHER): Payer: Medicare Other | Admitting: Physical Therapy

## 2023-09-25 DIAGNOSIS — M25512 Pain in left shoulder: Secondary | ICD-10-CM

## 2023-09-25 NOTE — Therapy (Signed)
OUTPATIENT PHYSICAL THERAPY UPPER EXTREMITY TREATMENT   Patient Name: Samantha Becker MRN: [id number] DOB:08-29-1955, 68 y.o., female Today's Date: 09/25/2023  END OF SESSION:  PT End of Session - 09/25/23 1216     Visit Number 9    Number of Visits 16    Date for PT Re-Evaluation 10/24/23    Authorization Type Medicare    PT Start Time 1217    PT Stop Time 1300    PT Time Calculation (min) 43 min    Activity Tolerance Patient tolerated treatment well    Behavior During Therapy WFL for tasks assessed/performed               Past Medical History:  Diagnosis Date   Arthritis    Chicken pox    GERD (gastroesophageal reflux disease)    OTC   Hypertension    Leaking of urine    Low iron    Mumps    Past Surgical History:  Procedure Laterality Date   CESAREAN SECTION     4 cs   LAPAROSCOPY  03/04/2012   Procedure: LAPAROSCOPY OPERATIVE;  Surgeon: Hal Morales, MD;  Location: [facility];  Service: Gynecology;  Laterality: N/A;  Peritoneum Biopsy   SHOULDER ARTHROSCOPY Right 03/20/2022   Procedure: RIGHT SHOULDER ARTHROSCOPY WITH DISTAL CLAVICLE RESECTION;  Surgeon: Huel Cote, MD;  Location: [facility];  Service: Orthopedics;  Laterality: Right;   TUBAL LIGATION Bilateral    Patient Active Problem List   Diagnosis Date Noted   Special screening for malignant neoplasms, colon 09/17/2023   Osteoarthritis of AC (acromioclavicular) joint    Endolymphatic hydrops of right ear 08/18/2020   Right-sided sensorineural hearing loss 08/18/2020   Atrophic vaginitis 03/20/2017   Endometriosis 03/18/2012   Dyspareunia 02/01/2012    PCP: Jarold Motto   REFERRING PROVIDER: Huel Cote  REFERRING DIAG: L shoulder s/p arthroscopy, DCE and SAD  THERAPY DIAG:  Acute pain of left shoulder  Rationale for Evaluation and Treatment: Rehabilitation  ONSET DATE: Surgery 08/26/23  SUBJECTIVE:                                                                                                                                                                                       SUBJECTIVE STATEMENT:  09/25/2023 Pt states doing ok. Having more frequent tingling into R 2-4 fingers, with use of R arm/elbow.   Eval: L shoulder Arthrsoscopy 08/26/23 with DCE and SAD. She has MD f/u on Monday 09/09/23. She is doing fairly well, bandages in place today, has been icing. States difficulty sleeping due to positioning, has been sleeping propped up in bed. Had previous R shoulder  scope last year, R shoulder doing very well a this time.   Hand dominance: Right  PERTINENT HISTORY: None (previous R shoulder scope)   PAIN:  Are you having pain? Yes: NPRS scale: 3/10 Pain location: L shoulder  anterior Pain description: sore, painful Aggravating factors: sore at rest/post op, sleeping,  Relieving factors: none stated   PRECAUTIONS: None  RED FLAGS: None   WEIGHT BEARING RESTRICTIONS: No  FALLS:  Has patient fallen in last 6 months? No   PLOF: Independent  PATIENT GOALS: Decreased pain in shoulder, improved  use of arm.   NEXT MD VISIT:   OBJECTIVE:   DIAGNOSTIC FINDINGS:    PATIENT SURVEYS :   Eval:  FOTO  22 Visit  5:   48  COGNITION: Overall cognitive status: Within functional limits for tasks assessed     SENSATION: WFL  POSTURE:  UPPER EXTREMITY ROM:   Active  PROM Right eval Left eval Left 09/02/23:  Left 09/17/22   Shoulder flexion  125 148 170 (standing)   Shoulder extension       Shoulder abduction       Shoulder adduction       Shoulder internal rotation       Shoulder external rotation  45 60 80 (at 90 deg abd)   Elbow flexion       Elbow extension       Wrist flexion       Wrist extension       Wrist ulnar deviation       Wrist radial deviation       Wrist pronation       Wrist supination       (Blank rows = not tested)   UPPER EXTREMITY MMT:   not tested due to surgery   MMT Right eval Left eval   Shoulder flexion    Shoulder extension    Shoulder abduction    Shoulder adduction    Shoulder internal rotation    Shoulder external rotation    Middle trapezius    Lower trapezius    Elbow flexion    Elbow extension    Wrist flexion    Wrist extension    Wrist ulnar deviation    Wrist radial deviation    Wrist pronation    Wrist supination    Grip strength (lbs)    (Blank rows = not tested)    JOINT MOBILITY TESTING:    PALPATION:    TODAY'S TREATMENT:                                                                                                                                         DATE:   09/25/2023  Therapeutic Exercise: Aerobic: Supine:  AROM/flexion  x10  S/L:   shoulder ER 2 x 10  (x10 1lb)  Seated:  pulley x 15 flex and scaption.  Standing:  shoulder ER ROM x 10,  with YTB 2 x 10;  shoulder flexion arom/full x 10,  abd/scaption/full x 10 Stretches: ulnar and median nerve glides in standing, and median in supine also, education for HEP,  Seated R UT stretch 20 sec x 3 ;  Neuromuscular Re-education: Manual Therapy: PROM for shoulder flex and ER.  Therapeutic Activity: Self Care:    Therapeutic Exercise: Aerobic: Supine:  AROM/flexion  x20 5" holds S/L:    Seated:  pulley x 15 flex and scaption.  Standing:    wall slides x 15 on L;    shoulder ER YTB 2 x 15;  shoulder flexion with posterior wall support 2x10 slow and controlled B Stretches:  lat stretch at railing x 3;  Neuromuscular Re-education: Manual Therapy: PROM for shoulder flex and ER.  Therapeutic Activity: Self Care:    PATIENT EDUCATION:  Education details: updated and reviewed HEP Person educated: Patient Education method: Explanation, Demonstration, Tactile cues, Verbal cues, and Handouts Education comprehension: verbalized understanding, returned demonstration, verbal cues required, tactile cues required, and needs further education   HOME EXERCISE PROGRAM: Access Code:  [email or web address]   ASSESSMENT:  CLINICAL IMPRESSION: 09/25/2023 + increased tingling into fingers with elbow in flexed position as well as holding arm up overhead today(pt reports hand feels cold). Reviewed median and ular nerve glides for R UE, as well as neck stretching and posture education. L shoulder progressing very well. With improved/full AROM today.    Eval:  Patient presents with deficits following L shoulder arthroscopy with SAD and DCE on 08/26/23. Bandages replaced with tegaderm today. She has limited ROM for prom and arom, as well as weakness in L shoulder. She has significant limitation in ability for any functional activity, lifting, carrying, ADLs or IADLS at this time due to surgery.  Pt to benefit from skilled PT to improve deficits and pain.    OBJECTIVE IMPAIRMENTS: decreased activity tolerance, decreased knowledge of use of DME, decreased mobility, decreased ROM, decreased strength, impaired UE functional use, and pain.   ACTIVITY LIMITATIONS: carrying, lifting, sleeping, bed mobility, bathing, toileting, dressing, self feeding, reach over head, hygiene/grooming, locomotion level, and caring for others  PARTICIPATION LIMITATIONS: meal prep, cleaning, laundry, driving, shopping, community activity, and yard work  PERSONAL FACTORS:  none  are also affecting patient's functional outcome.   REHAB POTENTIAL: Good  CLINICAL DECISION MAKING: Stable/uncomplicated  EVALUATION COMPLEXITY: Low  GOALS: Goals reviewed with patient? Yes   SHORT TERM GOALS: Target date: 09/26/2023   Pt to be independent with initial HEP  Goal status: MET  2.  Pt to demo ability for full PROM of L shoulder   Goal status: partially met     LONG TERM GOALS: Target date: 10/24/2023   Pt to be independent with final HEP  Goal status: partially met   2.  Pt to demo improved AROM to be [facility] and pain free, to improve ability for ADLs.   Goal status: partially met   3.  Pt to demo improved  strength of L shoulder to be at least 4/5, to improve ability for IADLS and ADLS.   Goal status: partially met   4.  Pt to demo ability for functional reach, lift, carry , up to 5 lb without deficits, for improved ability for IADLs and community activities.   Goal status: partially met     PLAN: PT FREQUENCY: 1-2x/week  PT DURATION: 8 weeks  PLANNED INTERVENTIONS: Therapeutic exercises, Therapeutic activity, Neuromuscular re-education, Patient/Family education, Self Care, Joint  mobilization, Joint manipulation, Stair training, DME instructions, Aquatic Therapy, Dry Needling, Electrical stimulation, Cryotherapy, Moist heat, Taping, Ultrasound, Ionotophoresis 4mg /ml Dexamethasone, Manual therapy,  Vasopneumatic device, Traction, Spinal manipulation, Spinal mobilization,    PLAN FOR NEXT SESSION:  PROM, AAROM as tolerated    Sedalia Muta, PT, DPT 12:16 PM  09/25/23

## 2023-09-27 ENCOUNTER — Other Ambulatory Visit: Payer: Self-pay

## 2023-09-27 ENCOUNTER — Other Ambulatory Visit (HOSPITAL_COMMUNITY): Payer: Self-pay

## 2023-09-27 ENCOUNTER — Encounter: Payer: Self-pay | Admitting: Pharmacist

## 2023-09-30 ENCOUNTER — Encounter (HOSPITAL_BASED_OUTPATIENT_CLINIC_OR_DEPARTMENT_OTHER): Payer: Medicare Other | Admitting: Physical Therapy

## 2023-09-30 ENCOUNTER — Other Ambulatory Visit: Payer: Self-pay

## 2023-10-01 ENCOUNTER — Telehealth: Payer: Self-pay | Admitting: Physical Therapy

## 2023-10-01 NOTE — Telephone Encounter (Signed)
 Called patient to return VM received on 1/31 about inquiry for later apt on 2/7. No later apts available, instructed patient to call to cancel apt if she is unable to make her apt. Reminded patient of next visit.  9:44 AM, 10/01/23 Samantha Becker, DPT Physical Therapy with [facility]

## 2023-10-02 ENCOUNTER — Encounter (HOSPITAL_BASED_OUTPATIENT_CLINIC_OR_DEPARTMENT_OTHER): Payer: Medicare Other

## 2023-10-02 ENCOUNTER — Encounter: Payer: Self-pay | Admitting: Physical Therapy

## 2023-10-02 ENCOUNTER — Ambulatory Visit (INDEPENDENT_AMBULATORY_CARE_PROVIDER_SITE_OTHER): Payer: Medicare Other | Admitting: Physical Therapy

## 2023-10-02 DIAGNOSIS — M25512 Pain in left shoulder: Secondary | ICD-10-CM

## 2023-10-02 NOTE — Therapy (Signed)
 OUTPATIENT PHYSICAL THERAPY UPPER EXTREMITY TREATMENT/ PN   Patient Name: Samantha Becker MRN: [id number] DOB:[birth year], 68 y.o., female Today's Date: 10/02/2023  Physical Therapy Progress Note  Dates of Reporting Period: 08/29/23  to 10/01/22   END OF SESSION:  PT End of Session - 10/02/23 1408     Visit Number 10    Number of Visits 16    Date for PT Re-Evaluation 10/24/23    Authorization Type Medicare . PN done at visit 10    PT Start Time 1303    PT Stop Time 1345    PT Time Calculation (min) 42 min    Activity Tolerance Patient tolerated treatment well    Behavior During Therapy WFL for tasks assessed/performed                Past Medical History:  Diagnosis Date   Arthritis    Chicken pox    GERD (gastroesophageal reflux disease)    OTC   Hypertension    Leaking of urine    Low iron    Mumps    Past Surgical History:  Procedure Laterality Date   CESAREAN SECTION     4 cs   LAPAROSCOPY  03/04/2012   Procedure: LAPAROSCOPY OPERATIVE;  Surgeon: [name], MD;  Location: [facility];  Service: Gynecology;  Laterality: N/A;  Peritoneum Biopsy   SHOULDER ARTHROSCOPY Right 03/20/2022   Procedure: RIGHT SHOULDER ARTHROSCOPY WITH DISTAL CLAVICLE RESECTION;  Surgeon: [name], MD;  Location: [facility];  Service: Orthopedics;  Laterality: Right;   TUBAL LIGATION Bilateral    Patient Active Problem List   Diagnosis Date Noted   Special screening for malignant neoplasms, colon 09/17/2023   Osteoarthritis of AC (acromioclavicular) joint    Endolymphatic hydrops of right ear 08/18/2020   Right-sided sensorineural hearing loss 08/18/2020   Atrophic vaginitis 03/20/2017   Endometriosis 03/18/2012   Dyspareunia 02/01/2012    PCP: [name]   REFERRING PROVIDER: [name]  REFERRING DIAG: L shoulder s/p arthroscopy, DCE and SAD  THERAPY DIAG:  Acute pain of left shoulder  Rationale for Evaluation and Treatment:  Rehabilitation  ONSET DATE: Surgery 08/26/23  SUBJECTIVE:                                                                                                                                                                                      SUBJECTIVE STATEMENT:  10/02/2023 Pt states doing ok. Still having  frequent tingling into R 2-4 fingers, with increased use of R arm/elbow. Feeling some into L fingers as well. She states L shoulder feeling good.   Eval: L shoulder Arthrsoscopy 08/26/23 with DCE  and SAD. She has MD f/u on Monday 09/09/23. She is doing fairly well, bandages in place today, has been icing. States difficulty sleeping due to positioning, has been sleeping propped up in bed. Had previous R shoulder scope last year, R shoulder doing very well a this time.   Hand dominance: Right  PERTINENT HISTORY: None (previous R shoulder scope)   PAIN:  Are you having pain? Yes: NPRS scale: 2/10 Pain location: L shoulder  anterior Pain description: sore, painful Aggravating factors: sore at rest/post op, sleeping,  Relieving factors: none stated   PRECAUTIONS: None  RED FLAGS: None   WEIGHT BEARING RESTRICTIONS: No  FALLS:  Has patient fallen in last 6 months? No   PLOF: Independent  PATIENT GOALS: Decreased pain in shoulder, improved  use of arm.   NEXT MD VISIT:   OBJECTIVE:   DIAGNOSTIC FINDINGS:    PATIENT SURVEYS :   Eval:  FOTO  22 Visit  5:   62  COGNITION: Overall cognitive status: Within functional limits for tasks assessed     SENSATION: WFL  POSTURE:  UPPER EXTREMITY ROM:   Active  PROM Right eval Left eval Left 09/02/23:  Left 09/17/22 Left 10/02/23  Shoulder flexion  125 148 170 (standing) wfl  Shoulder extension       Shoulder abduction     wfl  Shoulder adduction       Shoulder internal rotation       Shoulder external rotation  45 60 80 (at 90 deg abd) wfl  Elbow flexion       Elbow extension       Wrist flexion       Wrist extension        Wrist ulnar deviation       Wrist radial deviation       Wrist pronation       Wrist supination       (Blank rows = not tested)   UPPER EXTREMITY MMT:   not tested due to surgery   MMT Right eval Left eval  Shoulder flexion  4  Shoulder extension    Shoulder abduction  4-  Shoulder adduction    Shoulder internal rotation    Shoulder external rotation  4  Middle trapezius    Lower trapezius    Elbow flexion    Elbow extension    Wrist flexion    Wrist extension    Wrist ulnar deviation    Wrist radial deviation    Wrist pronation    Wrist supination    Grip strength (lbs)    (Blank rows = not tested)    JOINT MOBILITY TESTING:    PALPATION:    TODAY'S TREATMENT:  DATE:   10/02/2023  Therapeutic Exercise: Aerobic: Supine:  AROM/flexion  x10  S/L:    Seated:  pulley x 15 flex and scaption.  Standing:    shoulder ER ROM x 10,  with YTB 2 x 10; row GTB x 20  shoulder flexion arom/full x 10,  abd/scaption/full x 10 Stretches:   Neuromuscular Re-education: Manual Therapy:   cervical ROM, UT stretches, light cervical distraction  Therapeutic Activity: Self Care:    Therapeutic Exercise: Aerobic: Supine:  AROM/flexion  x20 5 holds S/L:    Seated:  pulley x 15 flex and scaption.  Standing:    wall slides x 15 on L;    shoulder ER YTB 2 x 15;  shoulder flexion with posterior wall support 2x10 slow and controlled B Stretches:  lat stretch at railing x 3;  Neuromuscular Re-education: Manual Therapy: PROM for shoulder flex and ER.  Therapeutic Activity: Self Care:    PATIENT EDUCATION:  Education details: updated and reviewed HEP Person educated: Patient Education method: Explanation, Demonstration, Tactile cues, Verbal cues, and Handouts Education comprehension: verbalized understanding, returned demonstration, verbal  cues required, tactile cues required, and needs further education   HOME EXERCISE PROGRAM: Access Code: [email or web address]   ASSESSMENT:  CLINICAL IMPRESSION: 10/02/2023 Pt has been seen for 10 visits Pts L shoulder is doing very well. Has full arom and doing well with strengthening. She is having little pain with rom and daily activities, but still some soreness at night. She continues to have tingling into R fingers, and some into L as well, Discussed seeing MD for this, she does have f/u for shoulder in 2 weeks. Pt overall progressing well with shoulder, but has new findings of UE symptoms. Pt to benefit from continued care to meet goals.    Eval:  Patient presents with deficits following L shoulder arthroscopy with SAD and DCE on 08/26/23. Bandages replaced with tegaderm today. She has limited ROM for prom and arom, as well as weakness in L shoulder. She has significant limitation in ability for any functional activity, lifting, carrying, ADLs or IADLS at this time due to surgery.  Pt to benefit from skilled PT to improve deficits and pain.    OBJECTIVE IMPAIRMENTS: decreased activity tolerance, decreased knowledge of use of DME, decreased mobility, decreased ROM, decreased strength, impaired UE functional use, and pain.   ACTIVITY LIMITATIONS: carrying, lifting, sleeping, bed mobility, bathing, toileting, dressing, self feeding, reach over head, hygiene/grooming, locomotion level, and caring for others  PARTICIPATION LIMITATIONS: meal prep, cleaning, laundry, driving, shopping, community activity, and yard work  PERSONAL FACTORS:  none  are also affecting patient's functional outcome.   REHAB POTENTIAL: Good  CLINICAL DECISION MAKING: Stable/uncomplicated  EVALUATION COMPLEXITY: Low  GOALS: Goals reviewed with patient? Yes   SHORT TERM GOALS: Target date: 09/26/2023   Pt to be independent with initial HEP  Goal status: MET  2.  Pt to demo ability for full PROM of L shoulder    Goal status: MET    LONG TERM GOALS: Target date: 10/24/2023   Pt to be independent with final HEP  Goal status: partially met   2.  Pt to demo improved AROM to be [facility] and pain free, to improve ability for ADLs.   Goal status: partially met   3.  Pt to demo improved strength of L shoulder to be at least 4/5, to improve ability for IADLS and ADLS.   Goal status: partially met   4.  Pt to demo ability for  functional reach, lift, carry , up to 5 lb without deficits, for improved ability for IADLs and community activities.   Goal status: partially met     PLAN: PT FREQUENCY: 1-2x/week  PT DURATION: 8 weeks  PLANNED INTERVENTIONS: Therapeutic exercises, Therapeutic activity, Neuromuscular re-education, Patient/Family education, Self Care, Joint mobilization, Joint manipulation, Stair training, DME instructions, Aquatic Therapy, Dry Needling, Electrical stimulation, Cryotherapy, Moist heat, Taping, Ultrasound, Ionotophoresis 4mg /ml Dexamethasone , Manual therapy,  Vasopneumatic device, Traction, Spinal manipulation, Spinal mobilization,    PLAN FOR NEXT SESSION:  PROM, AAROM as tolerated    Tinnie Don, PT, DPT 2:09 PM  10/02/23

## 2023-10-04 ENCOUNTER — Encounter: Payer: Self-pay | Admitting: Physical Therapy

## 2023-10-04 ENCOUNTER — Ambulatory Visit (INDEPENDENT_AMBULATORY_CARE_PROVIDER_SITE_OTHER): Payer: Medicare Other | Admitting: Physical Therapy

## 2023-10-04 DIAGNOSIS — M25512 Pain in left shoulder: Secondary | ICD-10-CM | POA: Diagnosis not present

## 2023-10-04 NOTE — Therapy (Signed)
 OUTPATIENT PHYSICAL THERAPY UPPER EXTREMITY TREATMENT/ PN   Patient Name: Samantha Becker MRN: [id number] DOB:1956-08-17, 68 y.o., female Today's Date: 10/04/2023  Physical Therapy Progress Note  Dates of Reporting Period: 08/29/23  to 10/01/22   END OF SESSION:  PT End of Session - 10/04/23 0836     Visit Number 11    Number of Visits 16    Date for PT Re-Evaluation 10/24/23    Authorization Type Medicare . PN done at visit 10    PT Start Time 0830    PT Stop Time 0915    PT Time Calculation (min) 45 min    Activity Tolerance Patient tolerated treatment well    Behavior During Therapy WFL for tasks assessed/performed                Past Medical History:  Diagnosis Date   Arthritis    Chicken pox    GERD (gastroesophageal reflux disease)    OTC   Hypertension    Leaking of urine    Low iron    Mumps    Past Surgical History:  Procedure Laterality Date   CESAREAN SECTION     4 cs   LAPAROSCOPY  03/04/2012   Procedure: LAPAROSCOPY OPERATIVE;  Surgeon: [name], MD;  Location: [facility];  Service: Gynecology;  Laterality: N/A;  Peritoneum Biopsy   SHOULDER ARTHROSCOPY Right 03/20/2022   Procedure: RIGHT SHOULDER ARTHROSCOPY WITH DISTAL CLAVICLE RESECTION;  Surgeon: [name], MD;  Location: [facility];  Service: Orthopedics;  Laterality: Right;   TUBAL LIGATION Bilateral    Patient Active Problem List   Diagnosis Date Noted   Special screening for malignant neoplasms, colon 09/17/2023   Osteoarthritis of AC (acromioclavicular) joint    Endolymphatic hydrops of right ear 08/18/2020   Right-sided sensorineural hearing loss 08/18/2020   Atrophic vaginitis 03/20/2017   Endometriosis 03/18/2012   Dyspareunia 02/01/2012    PCP: [name]   REFERRING PROVIDER: [name]  REFERRING DIAG: L shoulder s/p arthroscopy, DCE and SAD  THERAPY DIAG:  Acute pain of left shoulder  Rationale for Evaluation and Treatment:  Rehabilitation  ONSET DATE: Surgery 08/26/23  SUBJECTIVE:                                                                                                                                                                                      SUBJECTIVE STATEMENT:  10/04/2023 Pt states doing ok. Still having  frequent tingling into R 2-4 fingers, with increased use of R arm/elbow. Feeling some into L fingers as well. She states L shoulder feeling good.   Eval: L shoulder Arthrsoscopy 08/26/23 with DCE  and SAD. She has MD f/u on Monday 09/09/23. She is doing fairly well, bandages in place today, has been icing. States difficulty sleeping due to positioning, has been sleeping propped up in bed. Had previous R shoulder scope last year, R shoulder doing very well a this time.   Hand dominance: Right  PERTINENT HISTORY: None (previous R shoulder scope)   PAIN:  Are you having pain? Yes: NPRS scale: 2/10 Pain location: L shoulder  anterior Pain description: sore, painful Aggravating factors: sore at rest/post op, sleeping,  Relieving factors: none stated   PRECAUTIONS: None  RED FLAGS: None   WEIGHT BEARING RESTRICTIONS: No  FALLS:  Has patient fallen in last 6 months? No   PLOF: Independent  PATIENT GOALS: Decreased pain in shoulder, improved  use of arm.   NEXT MD VISIT:   OBJECTIVE:   DIAGNOSTIC FINDINGS:    PATIENT SURVEYS :   Eval:  FOTO  22 Visit  5:   54 Visit 11:    COGNITION: Overall cognitive status: Within functional limits for tasks assessed     SENSATION: WFL  POSTURE:  UPPER EXTREMITY ROM:   Active  PROM Right eval Left eval Left 09/02/23:  Left 09/17/22 Left 10/02/23  Shoulder flexion  125 148 170 (standing) wfl  Shoulder extension       Shoulder abduction     wfl  Shoulder adduction       Shoulder internal rotation       Shoulder external rotation  45 60 80 (at 90 deg abd) wfl  Elbow flexion       Elbow extension       Wrist flexion        Wrist extension       Wrist ulnar deviation       Wrist radial deviation       Wrist pronation       Wrist supination       (Blank rows = not tested)   UPPER EXTREMITY MMT:   not tested due to surgery   MMT Right eval Left eval  Shoulder flexion  4  Shoulder extension    Shoulder abduction  4-  Shoulder adduction    Shoulder internal rotation    Shoulder external rotation  4  Middle trapezius    Lower trapezius    Elbow flexion    Elbow extension    Wrist flexion    Wrist extension    Wrist ulnar deviation    Wrist radial deviation    Wrist pronation    Wrist supination    Grip strength (lbs)    (Blank rows = not tested)    JOINT MOBILITY TESTING:    PALPATION:    TODAY'S TREATMENT:  DATE:   10/04/2023  Therapeutic Exercise: Aerobic: Supine:   Quadruped:  SA presses 2 x 10;  S/L:    Seated:   Standing:    shoulder ER ROM x 10,  with RTB 2 x 10;  Stretches:   Neuromuscular Re-education: At mirror:  shoulder flexion arom/full x 10,  x10 , 2lb ;  x10  wall angels 2 x 10; cueing for slight bend in knees for back posure  Manual Therapy:   cervical ROM, UT stretches, light cervical distraction LAD;  R shoulder, pec release R shoulder  Therapeutic Activity: pulley x 15 flex and scaption. -ROM for reaching  row GTB x 20;  low row GTB x 15 - strength/pull for carrying Self Care:     Therapeutic Exercise: Aerobic: Supine:  AROM/flexion  x10  S/L:    Seated:  pulley x 15 flex and scaption.  Standing:    shoulder ER ROM x 10,  with YTB 2 x 10; row GTB x 20  shoulder flexion arom/full x 10,  abd/scaption/full x 10 Stretches:   Neuromuscular Re-education: Manual Therapy:   cervical ROM, UT stretches, light cervical distraction  Therapeutic Activity: Self Care:    Therapeutic Exercise: Aerobic: Supine:  AROM/flexion  x20  5 holds S/L:    Seated:  pulley x 15 flex and scaption.  Standing:    wall slides x 15 on L;    shoulder ER YTB 2 x 15;  shoulder flexion with posterior wall support 2x10 slow and controlled B Stretches:  lat stretch at railing x 3;  Neuromuscular Re-education: Manual Therapy: PROM for shoulder flex and ER.  Therapeutic Activity: Self Care:    PATIENT EDUCATION:  Education details: updated and reviewed HEP Person educated: Patient Education method: Explanation, Demonstration, Tactile cues, Verbal cues, and Handouts Education comprehension: verbalized understanding, returned demonstration, verbal cues required, tactile cues required, and needs further education   HOME EXERCISE PROGRAM: Access Code: [email or web address]   ASSESSMENT:  CLINICAL IMPRESSION:  10/04/2023 Pt progressing very well with L shoulder. She has improved pain/tingling in R UE since last visit. She has good tolerance for progressive strength today. Noted improvements with functional activity and lift/carry at home with IADLS. Pt to benefit from continued care. She will be out of town next week.   Eval:  Patient presents with deficits following L shoulder arthroscopy with SAD and DCE on 08/26/23. Bandages replaced with tegaderm today. She has limited ROM for prom and arom, as well as weakness in L shoulder. She has significant limitation in ability for any functional activity, lifting, carrying, ADLs or IADLS at this time due to surgery.  Pt to benefit from skilled PT to improve deficits and pain.    OBJECTIVE IMPAIRMENTS: decreased activity tolerance, decreased knowledge of use of DME, decreased mobility, decreased ROM, decreased strength, impaired UE functional use, and pain.   ACTIVITY LIMITATIONS: carrying, lifting, sleeping, bed mobility, bathing, toileting, dressing, self feeding, reach over head, hygiene/grooming, locomotion level, and caring for others  PARTICIPATION LIMITATIONS: meal prep, cleaning, laundry,  driving, shopping, community activity, and yard work  PERSONAL FACTORS:  none  are also affecting patient's functional outcome.   REHAB POTENTIAL: Good  CLINICAL DECISION MAKING: Stable/uncomplicated  EVALUATION COMPLEXITY: Low  GOALS: Goals reviewed with patient? Yes   SHORT TERM GOALS: Target date: 09/26/2023   Pt to be independent with initial HEP  Goal status: MET  2.  Pt to demo ability for full PROM of L shoulder   Goal status: MET  LONG TERM GOALS: Target date: 10/24/2023   Pt to be independent with final HEP  Goal status: partially met   2.  Pt to demo improved AROM to be [facility] and pain free, to improve ability for ADLs.   Goal status: partially met   3.  Pt to demo improved strength of L shoulder to be at least 4/5, to improve ability for IADLS and ADLS.   Goal status: partially met   4.  Pt to demo ability for functional reach, lift, carry , up to 5 lb without deficits, for improved ability for IADLs and community activities.   Goal status: partially met     PLAN: PT FREQUENCY: 1-2x/week  PT DURATION: 8 weeks  PLANNED INTERVENTIONS: Therapeutic exercises, Therapeutic activity, Neuromuscular re-education, Patient/Family education, Self Care, Joint mobilization, Joint manipulation, Stair training, DME instructions, Aquatic Therapy, Dry Needling, Electrical stimulation, Cryotherapy, Moist heat, Taping, Ultrasound, Ionotophoresis 4mg /ml Dexamethasone , Manual therapy,  Vasopneumatic device, Traction, Spinal manipulation, Spinal mobilization,    PLAN FOR NEXT SESSION:  AROM, strength as tolerated.    Tinnie Don, PT, DPT 8:36 AM  10/04/23

## 2023-10-14 ENCOUNTER — Encounter: Payer: Self-pay | Admitting: Physical Therapy

## 2023-10-14 ENCOUNTER — Ambulatory Visit (INDEPENDENT_AMBULATORY_CARE_PROVIDER_SITE_OTHER): Payer: Medicare Other | Admitting: Physical Therapy

## 2023-10-14 DIAGNOSIS — M25512 Pain in left shoulder: Secondary | ICD-10-CM

## 2023-10-14 NOTE — Therapy (Signed)
OUTPATIENT PHYSICAL THERAPY UPPER EXTREMITY TREATMENT   Patient Name: Samantha Becker MRN: [id number] DOB:1956/08/19, 68 y.o., female Today's Date: 10/14/2023   END OF SESSION:  PT End of Session - 10/14/23 0846     Visit Number 12    Number of Visits 16    Date for PT Re-Evaluation 10/24/23    Authorization Type Medicare . PN done at visit 10    PT Start Time 0847    PT Stop Time 0928    PT Time Calculation (min) 41 min    Activity Tolerance Patient tolerated treatment well    Behavior During Therapy WFL for tasks assessed/performed                Past Medical History:  Diagnosis Date   Arthritis    Chicken pox    GERD (gastroesophageal reflux disease)    OTC   Hypertension    Leaking of urine    Low iron    Mumps    Past Surgical History:  Procedure Laterality Date   CESAREAN SECTION     4 cs   LAPAROSCOPY  03/04/2012   Procedure: LAPAROSCOPY OPERATIVE;  Surgeon: Hal Morales, MD;  Location: [facility];  Service: Gynecology;  Laterality: N/A;  Peritoneum Biopsy   SHOULDER ARTHROSCOPY Right 03/20/2022   Procedure: RIGHT SHOULDER ARTHROSCOPY WITH DISTAL CLAVICLE RESECTION;  Surgeon: Huel Cote, MD;  Location: [facility];  Service: Orthopedics;  Laterality: Right;   TUBAL LIGATION Bilateral    Patient Active Problem List   Diagnosis Date Noted   Special screening for malignant neoplasms, colon 09/17/2023   Osteoarthritis of AC (acromioclavicular) joint    Endolymphatic hydrops of right ear 08/18/2020   Right-sided sensorineural hearing loss 08/18/2020   Atrophic vaginitis 03/20/2017   Endometriosis 03/18/2012   Dyspareunia 02/01/2012    PCP: Jarold Motto   REFERRING PROVIDER: Huel Cote  REFERRING DIAG: L shoulder s/p arthroscopy, DCE and SAD  THERAPY DIAG:  Acute pain of left shoulder  Rationale for Evaluation and Treatment: Rehabilitation  ONSET DATE: Surgery 08/26/23  SUBJECTIVE:                                                                                                                                                                                       SUBJECTIVE STATEMENT:  10/14/2023 Pt states doing well. Shoulder feeling pretty good. Still has mild tingling into R hand/fingers, but has lessened. Still feels difficult to reach arms up/out to sids.   Eval: L shoulder Arthrsoscopy 08/26/23 with DCE and SAD. She has MD f/u on Monday 09/09/23. She is doing fairly well, bandages in place today, has been  icing. States difficulty sleeping due to positioning, has been sleeping propped up in bed. Had previous R shoulder scope last year, R shoulder doing very well a this time.   Hand dominance: Right  PERTINENT HISTORY: None (previous R shoulder scope)   PAIN:  Are you having pain? Yes: NPRS scale: 2/10 Pain location: L shoulder  anterior Pain description: sore, painful Aggravating factors: sore at rest/post op, sleeping,  Relieving factors: none stated   PRECAUTIONS: None  RED FLAGS: None   WEIGHT BEARING RESTRICTIONS: No  FALLS:  Has patient fallen in last 6 months? No   PLOF: Independent  PATIENT GOALS: Decreased pain in shoulder, improved  use of arm.   NEXT MD VISIT:   OBJECTIVE:   DIAGNOSTIC FINDINGS:    PATIENT SURVEYS :   Eval:  FOTO  22 Visit  5:   54 Visit 12:   18  COGNITION: Overall cognitive status: Within functional limits for tasks assessed     SENSATION: WFL  POSTURE:  UPPER EXTREMITY ROM:   Active  PROM Right eval Left eval Left 09/02/23:  Left 09/17/22 Left 10/02/23  Shoulder flexion  125 148 170 (standing) wfl  Shoulder extension       Shoulder abduction     wfl  Shoulder adduction       Shoulder internal rotation       Shoulder external rotation  45 60 80 (at 90 deg abd) wfl  Elbow flexion       Elbow extension       Wrist flexion       Wrist extension       Wrist ulnar deviation       Wrist radial deviation       Wrist pronation        Wrist supination       (Blank rows = not tested)   UPPER EXTREMITY MMT:   not tested due to surgery   MMT Right eval Left eval  Shoulder flexion  4  Shoulder extension    Shoulder abduction  4-  Shoulder adduction    Shoulder internal rotation    Shoulder external rotation  4  Middle trapezius    Lower trapezius    Elbow flexion    Elbow extension    Wrist flexion    Wrist extension    Wrist ulnar deviation    Wrist radial deviation    Wrist pronation    Wrist supination    Grip strength (lbs)    (Blank rows = not tested)    JOINT MOBILITY TESTING:    PALPATION:    TODAY'S TREATMENT:                                                                                                                                         DATE:   10/14/2023  Therapeutic Exercise: Aerobic: Supine:  SA reaches x 10;  horizontal abd to tolerance 2 x 10;  Quadruped:   S/L:    Seated:   Standing:    shoulder ER ROM x 10,  with RTB 2 x 10;  Row x 15 GTB Stretches:  pec stretch x 2 min with wrist rom for nerve glide.  Neuromuscular Re-education: At mirror:  shoulder flexion arom/full x 10,  abd to 90 deg x 10;  military press (Rom) x 10;  Manual Therapy:   cervical ROM, UT stretches, light cervical distraction LAD;  R shoulder, pec release R shoulder   Self Care:     Therapeutic Exercise: Aerobic: Supine:  AROM/flexion  x10  S/L:    Seated:  pulley x 15 flex and scaption.  Standing:    shoulder ER ROM x 10,  with YTB 2 x 10; row GTB x 20  shoulder flexion arom/full x 10,  abd/scaption/full x 10 Stretches:   Neuromuscular Re-education: Manual Therapy:   cervical ROM, UT stretches, light cervical distraction  Therapeutic Activity: Self Care:    Therapeutic Exercise: Aerobic: Supine:  AROM/flexion  x20 5" holds S/L:    Seated:  pulley x 15 flex and scaption.  Standing:    wall slides x 15 on L;    shoulder ER YTB 2 x 15;  shoulder flexion with posterior wall support 2x10  slow and controlled B Stretches:  lat stretch at railing x 3;  Neuromuscular Re-education: Manual Therapy: PROM for shoulder flex and ER.  Therapeutic Activity: Self Care:    PATIENT EDUCATION:  Education details: updated and reviewed HEP Person educated: Patient Education method: Explanation, Demonstration, Tactile cues, Verbal cues, and Handouts Education comprehension: verbalized understanding, returned demonstration, verbal cues required, tactile cues required, and needs further education   HOME EXERCISE PROGRAM: Access Code: [email or web address]   ASSESSMENT:  CLINICAL IMPRESSION:  10/14/2023 Pt progressing  well with L shoulder. She has mild improvments in pain/tingling in R UE since last visit. She notes difficulty with full rom for abduction motion with pain in mid range above 90 deg. Reviewed continued anterior release and posterior strength to improve this, as well as light ROM for this motion today. Pt to benefit from continued care.   Eval:  Patient presents with deficits following L shoulder arthroscopy with SAD and DCE on 08/26/23. Bandages replaced with tegaderm today. She has limited ROM for prom and arom, as well as weakness in L shoulder. She has significant limitation in ability for any functional activity, lifting, carrying, ADLs or IADLS at this time due to surgery.  Pt to benefit from skilled PT to improve deficits and pain.    OBJECTIVE IMPAIRMENTS: decreased activity tolerance, decreased knowledge of use of DME, decreased mobility, decreased ROM, decreased strength, impaired UE functional use, and pain.   ACTIVITY LIMITATIONS: carrying, lifting, sleeping, bed mobility, bathing, toileting, dressing, self feeding, reach over head, hygiene/grooming, locomotion level, and caring for others  PARTICIPATION LIMITATIONS: meal prep, cleaning, laundry, driving, shopping, community activity, and yard work  PERSONAL FACTORS:  none  are also affecting patient's functional outcome.    REHAB POTENTIAL: Good  CLINICAL DECISION MAKING: Stable/uncomplicated  EVALUATION COMPLEXITY: Low  GOALS: Goals reviewed with patient? Yes   SHORT TERM GOALS: Target date: 09/26/2023   Pt to be independent with initial HEP  Goal status: MET  2.  Pt to demo ability for full PROM of L shoulder   Goal status: MET    LONG TERM GOALS: Target date: 10/24/2023   Pt to be independent with final  HEP  Goal status: partially met   2.  Pt to demo improved AROM to be [facility] and pain free, to improve ability for ADLs.   Goal status: partially met   3.  Pt to demo improved strength of L shoulder to be at least 4/5, to improve ability for IADLS and ADLS.   Goal status: partially met   4.  Pt to demo ability for functional reach, lift, carry , up to 5 lb without deficits, for improved ability for IADLs and community activities.   Goal status: partially met     PLAN: PT FREQUENCY: 1-2x/week  PT DURATION: 8 weeks  PLANNED INTERVENTIONS: Therapeutic exercises, Therapeutic activity, Neuromuscular re-education, Patient/Family education, Self Care, Joint mobilization, Joint manipulation, Stair training, DME instructions, Aquatic Therapy, Dry Needling, Electrical stimulation, Cryotherapy, Moist heat, Taping, Ultrasound, Ionotophoresis 4mg /ml Dexamethasone, Manual therapy,  Vasopneumatic device, Traction, Spinal manipulation, Spinal mobilization,    PLAN FOR NEXT SESSION:  AROM, strength as tolerated.    Sedalia Muta, PT, DPT 10:16 AM  10/14/23

## 2023-10-16 ENCOUNTER — Encounter: Payer: Medicare Other | Admitting: Physical Therapy

## 2023-10-16 ENCOUNTER — Telehealth: Payer: Self-pay | Admitting: Gastroenterology

## 2023-10-16 NOTE — Telephone Encounter (Signed)
Good morning Dr. Barron Alvine,   Patient called stating she needed to reschedule her colonoscopy for tomorrow 2/20 at 8:00 due to the weather.   Patient was rescheduled for 4/4 at 8:00

## 2023-10-17 ENCOUNTER — Encounter: Payer: Medicare Other | Admitting: Gastroenterology

## 2023-10-18 ENCOUNTER — Encounter: Payer: Self-pay | Admitting: Physical Therapy

## 2023-10-18 ENCOUNTER — Ambulatory Visit (HOSPITAL_BASED_OUTPATIENT_CLINIC_OR_DEPARTMENT_OTHER): Payer: Medicare Other | Admitting: Orthopaedic Surgery

## 2023-10-18 ENCOUNTER — Ambulatory Visit (INDEPENDENT_AMBULATORY_CARE_PROVIDER_SITE_OTHER): Payer: Medicare Other | Admitting: Physical Therapy

## 2023-10-18 DIAGNOSIS — M25512 Pain in left shoulder: Secondary | ICD-10-CM | POA: Diagnosis not present

## 2023-10-18 DIAGNOSIS — G5601 Carpal tunnel syndrome, right upper limb: Secondary | ICD-10-CM

## 2023-10-18 NOTE — Progress Notes (Signed)
Post Operative Evaluation    Procedure/Date of Surgery: Left shoulder AC joint debridement 12/30  Interval History:    Presents today 7 weeks status post left distal clavicle excision overall doing extremely well.  She is continuing to work through range of motion and strengthening.  Overall she is continuing to feel much better.  She does have evidence of bilateral hand numbness consistent with carpal tunnel syndrome   PMH/PSH/Family History/Social History/Meds/Allergies:    Past Medical History:  Diagnosis Date   Arthritis    Chicken pox    GERD (gastroesophageal reflux disease)    OTC   Hypertension    Leaking of urine    Low iron    Mumps    Past Surgical History:  Procedure Laterality Date   CESAREAN SECTION     4 cs   LAPAROSCOPY  03/04/2012   Procedure: LAPAROSCOPY OPERATIVE;  Surgeon: Hal Morales, MD;  Location: [facility];  Service: Gynecology;  Laterality: N/A;  Peritoneum Biopsy   SHOULDER ARTHROSCOPY Right 03/20/2022   Procedure: RIGHT SHOULDER ARTHROSCOPY WITH DISTAL CLAVICLE RESECTION;  Surgeon: Huel Cote, MD;  Location: [facility];  Service: Orthopedics;  Laterality: Right;   TUBAL LIGATION Bilateral    Social History   Socioeconomic History   Marital status: Married    Spouse name: Not on file   Number of children: Not on file   Years of education: Not on file   Highest education level: Not on file  Occupational History   Occupation: TEACHER     Employer: [employer]  Tobacco Use   Smoking status: Never   Smokeless tobacco: Never  Vaping Use   Vaping status: Never Used  Substance and Sexual Activity   Alcohol use: Yes    Alcohol/week: 4.0 standard drinks of alcohol    Types: 2 Glasses of wine, 2 Cans of beer per week    Comment: wine 3-4 times a week   Drug use: No   Sexual activity: Yes    Birth control/protection: Surgical, Post-menopausal    Comment: BTL  Other Topics Concern    Not on file  Social History Narrative   Prior Runner, broadcasting/film/video at [employer], now does tutoring -- once a week   Husband and her lives in home   Social Drivers of Health   Financial Resource Strain: Not on file  Food Insecurity: Not on file  Transportation Needs: Not on file  Physical Activity: Not on file  Stress: Not on file  Social Connections: Not on file   Family History  Problem Relation Age of Onset   Heart disease Mother    Hypertension Mother    Arthritis Mother    Breast cancer Mother    Osteoporosis Mother    Heart failure Mother    Heart disease Father    Hypertension Father    Cancer Father    Stroke Father    Arthritis Sister    Asthma Sister    Colon cancer Neg Hx    Esophageal cancer Neg Hx    Rectal cancer Neg Hx    Stomach cancer Neg Hx    Colon polyps Neg Hx    Allergies  Allergen Reactions   Morphine Nausea Only   Current Outpatient Medications  Medication Sig Dispense Refill   aspirin EC 325 MG  tablet Take 1 tablet (325 mg total) by mouth daily. 14 tablet 0   Calcium Carb-Cholecalciferol (CALCIUM 500 + D PO) Take 2 each by mouth daily in the afternoon.     conjugated estrogens (PREMARIN) vaginal cream Premarin 0.625 mg/gram vaginal cream  Insert 0.5 applicatorsful twice a week by vaginal route. 42.5 g 2   ibuprofen (ADVIL) 200 MG tablet Take 800 mg by mouth every 6 (six) hours as needed.     tretinoin (RETIN-A) 0.025 % cream Apply 1 Application topically every evening.     triamcinolone cream (KENALOG) 0.1 % APPLY TO AFFECTED AREA 1-2 TIMES DAILY. 45 g 0   TURMERIC PO Take 3 tablets by mouth daily in the afternoon.     valsartan (DIOVAN) 80 MG tablet Take 1 tablet (80 mg total) by mouth daily. 30 tablet 5   No current facility-administered medications for this visit.   No results found.  Review of Systems:   A ROS was performed including pertinent positives and negatives as documented in the HPI.   Musculoskeletal Exam:    There were no vitals  taken for this visit.  Left shoulder incisions are well-appearing without erythema or drainage.  Forward active elevation is to 170 degrees with external rotation at side to 60 degrees.  Internal rotation is to L5 compared to T12 on the contralateral  Imaging:      I personally reviewed and interpreted the radiographs.   Assessment:   7 weeks status post left shoulder distal clavicle resection overall doing extremely well.  Overall she is doing quite well and continuing to improve.  At this time she will be activity as tolerated.  I did describe that we could perform an additional EMG nerve conduction of her hands should she want to be further assessed for carpal tunnel.  Plan :    -Return to clinic as needed      I personally saw and evaluated the patient, and participated in the management and treatment plan.  Huel Cote, MD Attending Physician, Orthopedic Surgery  This document was dictated using Dragon voice recognition software. A reasonable attempt at proof reading has been made to minimize errors.

## 2023-10-18 NOTE — Therapy (Signed)
OUTPATIENT PHYSICAL THERAPY UPPER EXTREMITY TREATMENT   Patient Name: Samantha Becker MRN: [id number] DOB:1956-06-05, 68 y.o., female Today's Date: 10/18/2023   END OF SESSION:  PT End of Session - 10/18/23 1402     Visit Number 13    Number of Visits 16    Date for PT Re-Evaluation 10/24/23    Authorization Type Medicare . PN done at visit 10    PT Start Time 1400    PT Stop Time 1440    PT Time Calculation (min) 40 min    Activity Tolerance Patient tolerated treatment well    Behavior During Therapy WFL for tasks assessed/performed                Past Medical History:  Diagnosis Date   Arthritis    Chicken pox    GERD (gastroesophageal reflux disease)    OTC   Hypertension    Leaking of urine    Low iron    Mumps    Past Surgical History:  Procedure Laterality Date   CESAREAN SECTION     4 cs   LAPAROSCOPY  03/04/2012   Procedure: LAPAROSCOPY OPERATIVE;  Surgeon: Hal Morales, MD;  Location: [facility];  Service: Gynecology;  Laterality: N/A;  Peritoneum Biopsy   SHOULDER ARTHROSCOPY Right 03/20/2022   Procedure: RIGHT SHOULDER ARTHROSCOPY WITH DISTAL CLAVICLE RESECTION;  Surgeon: Huel Cote, MD;  Location:  [facility];  Service: Orthopedics;  Laterality: Right;   TUBAL LIGATION Bilateral    Patient Active Problem List   Diagnosis Date Noted   Special screening for malignant neoplasms, colon 09/17/2023   Osteoarthritis of AC (acromioclavicular) joint    Endolymphatic hydrops of right ear 08/18/2020   Right-sided sensorineural hearing loss 08/18/2020   Atrophic vaginitis 03/20/2017   Endometriosis 03/18/2012   Dyspareunia 02/01/2012    PCP: Jarold Motto   REFERRING PROVIDER: Huel Cote  REFERRING DIAG: L shoulder s/p arthroscopy, DCE and SAD  THERAPY DIAG:  Acute pain of left shoulder  Rationale for Evaluation and Treatment: Rehabilitation  ONSET DATE: Surgery 08/26/23  SUBJECTIVE:                                                                                                                                                                                       SUBJECTIVE STATEMENT:  10/18/2023 Pt states doing well. Shoulder feeling pretty good. Was more sore earlier in the week. Still having tingling daily into arms and hands.   Eval: L shoulder Arthrsoscopy 08/26/23 with DCE and SAD. She has MD f/u on Monday 09/09/23. She is doing fairly well, bandages in place today, has been icing. States difficulty sleeping  due to positioning, has been sleeping propped up in bed. Had previous R shoulder scope last year, R shoulder doing very well a this time.   Hand dominance: Right  PERTINENT HISTORY: None (previous R shoulder scope)   PAIN:  Are you having pain? Yes: NPRS scale: 2/10 Pain location: L shoulder  anterior Pain description: sore, painful Aggravating factors: sore at rest/post op, sleeping,  Relieving factors: none stated   PRECAUTIONS: None  RED FLAGS: None   WEIGHT BEARING RESTRICTIONS: No  FALLS:  Has patient fallen in last 6 months? No   PLOF: Independent  PATIENT GOALS: Decreased pain in shoulder, improved  use of arm.   NEXT MD VISIT:   OBJECTIVE:   DIAGNOSTIC FINDINGS:    PATIENT SURVEYS :   Eval:  FOTO  22 Visit  5:   54 Visit 12:   70  COGNITION: Overall cognitive status: Within functional limits for tasks assessed     SENSATION: WFL  POSTURE:  UPPER EXTREMITY ROM:   Active  PROM Right eval Left eval Left 09/02/23:  Left 09/17/22 Left 10/02/23  Shoulder flexion  125 148 170 (standing) wfl  Shoulder extension       Shoulder abduction     wfl  Shoulder adduction       Shoulder internal rotation       Shoulder external rotation  45 60 80 (at 90 deg abd) wfl  Elbow flexion       Elbow extension       Wrist flexion       Wrist extension       Wrist ulnar deviation       Wrist radial deviation       Wrist pronation       Wrist supination       (Blank  rows = not tested)   UPPER EXTREMITY MMT:   not tested due to surgery   MMT Right eval Left eval  Shoulder flexion  4  Shoulder extension    Shoulder abduction  4-  Shoulder adduction    Shoulder internal rotation    Shoulder external rotation  4  Middle trapezius    Lower trapezius    Elbow flexion    Elbow extension    Wrist flexion    Wrist extension    Wrist ulnar deviation    Wrist radial deviation    Wrist pronation    Wrist supination    Grip strength (lbs)    (Blank rows = not tested)    JOINT MOBILITY TESTING:    PALPATION:    TODAY'S TREATMENT:                                                                                                                                         DATE:   10/18/2023  Therapeutic Exercise: Aerobic:  Supine:  SA reaches x 10;   Chest  press 3 lb x 15;  Quadruped:   S/L:   horiz abd x 10;   ER  2lb x 10; x10 no weight. , abd x 12 Seated:   Standing:    shoulder ER ROM x 10,  with RTB 2 x 10;  Row x 15 GTB Stretches:    Education on wrist ext stretch for HEP x 2 min.  Neuromuscular Re-education: At mirror:  shoulder flexion arom/full x 10,  abd to 90 deg x 10;  military press (Rom) x 10;  pec stretch x 2 min with wrist rom for nerve glide. Manual Therapy:   cervical ROM, UT stretches, light cervical distraction LAD;  R shoulder, R shoulder  Self Care:     Therapeutic Exercise: Aerobic: Supine:  AROM/flexion  x10  S/L:    Seated:  pulley x 15 flex and scaption.  Standing:    shoulder ER ROM x 10,  with YTB 2 x 10; row GTB x 20  shoulder flexion arom/full x 10,  abd/scaption/full x 10 Stretches:   Neuromuscular Re-education: Manual Therapy:   cervical ROM, UT stretches, light cervical distraction  Therapeutic Activity: Self Care:    Therapeutic Exercise: Aerobic: Supine:  AROM/flexion  x20 5" holds S/L:    Seated:  pulley x 15 flex and scaption.  Standing:    wall slides x 15 on L;    shoulder ER YTB 2 x 15;   shoulder flexion with posterior wall support 2x10 slow and controlled B Stretches:  lat stretch at railing x 3;  Neuromuscular Re-education: Manual Therapy: PROM for shoulder flex and ER.  Therapeutic Activity: Self Care:    PATIENT EDUCATION:  Education details: updated and reviewed HEP Person educated: Patient Education method: Explanation, Demonstration, Tactile cues, Verbal cues, and Handouts Education comprehension: verbalized understanding, returned demonstration, verbal cues required, tactile cues required, and needs further education   HOME EXERCISE PROGRAM: Access Code: [email or web address]   ASSESSMENT:  CLINICAL IMPRESSION:  10/18/2023 Pt progressing  well with L shoulder.She continues to have some difficulty with full rom for abduction motion with pain in mid range above 90 deg. Doing well with strength for other motions. Pt to benefit from continued care.   Eval:  Patient presents with deficits following L shoulder arthroscopy with SAD and DCE on 08/26/23. Bandages replaced with tegaderm today. She has limited ROM for prom and arom, as well as weakness in L shoulder. She has significant limitation in ability for any functional activity, lifting, carrying, ADLs or IADLS at this time due to surgery.  Pt to benefit from skilled PT to improve deficits and pain.    OBJECTIVE IMPAIRMENTS: decreased activity tolerance, decreased knowledge of use of DME, decreased mobility, decreased ROM, decreased strength, impaired UE functional use, and pain.   ACTIVITY LIMITATIONS: carrying, lifting, sleeping, bed mobility, bathing, toileting, dressing, self feeding, reach over head, hygiene/grooming, locomotion level, and caring for others  PARTICIPATION LIMITATIONS: meal prep, cleaning, laundry, driving, shopping, community activity, and yard work  PERSONAL FACTORS:  none  are also affecting patient's functional outcome.   REHAB POTENTIAL: Good  CLINICAL DECISION MAKING:  Stable/uncomplicated  EVALUATION COMPLEXITY: Low  GOALS: Goals reviewed with patient? Yes   SHORT TERM GOALS: Target date: 09/26/2023   Pt to be independent with initial HEP  Goal status: MET  2.  Pt to demo ability for full PROM of L shoulder   Goal status: MET    LONG TERM GOALS: Target date: 10/24/2023   Pt to be independent  with final HEP  Goal status: partially met   2.  Pt to demo improved AROM to be [facility] and pain free, to improve ability for ADLs.   Goal status: partially met   3.  Pt to demo improved strength of L shoulder to be at least 4/5, to improve ability for IADLS and ADLS.   Goal status: partially met   4.  Pt to demo ability for functional reach, lift, carry , up to 5 lb without deficits, for improved ability for IADLs and community activities.   Goal status: partially met     PLAN: PT FREQUENCY: 1-2x/week  PT DURATION: 8 weeks  PLANNED INTERVENTIONS: Therapeutic exercises, Therapeutic activity, Neuromuscular re-education, Patient/Family education, Self Care, Joint mobilization, Joint manipulation, Stair training, DME instructions, Aquatic Therapy, Dry Needling, Electrical stimulation, Cryotherapy, Moist heat, Taping, Ultrasound, Ionotophoresis 4mg /ml Dexamethasone, Manual therapy,  Vasopneumatic device, Traction, Spinal manipulation, Spinal mobilization,    PLAN FOR NEXT SESSION:  AROM, strength as tolerated.    Sedalia Muta, PT, DPT 4:10 PM  10/18/23

## 2023-10-21 ENCOUNTER — Ambulatory Visit (INDEPENDENT_AMBULATORY_CARE_PROVIDER_SITE_OTHER): Payer: Medicare Other | Admitting: Physical Therapy

## 2023-10-21 ENCOUNTER — Encounter: Payer: Self-pay | Admitting: Physical Therapy

## 2023-10-21 DIAGNOSIS — M25512 Pain in left shoulder: Secondary | ICD-10-CM | POA: Diagnosis not present

## 2023-10-21 NOTE — Therapy (Signed)
 OUTPATIENT PHYSICAL THERAPY UPPER EXTREMITY TREATMENT   Patient Name: Samantha Becker MRN: [id number] DOB:December 07, 1955, 68 y.o., female Today's Date: 10/21/2023   END OF SESSION:  PT End of Session - 10/21/23 1010     Visit Number 14    Number of Visits 16    Date for PT Re-Evaluation 10/24/23    Authorization Type Medicare . PN done at visit 10    PT Start Time 0933    PT Stop Time 1012    PT Time Calculation (min) 39 min    Activity Tolerance Patient tolerated treatment well    Behavior During Therapy WFL for tasks assessed/performed                 Past Medical History:  Diagnosis Date   Arthritis    Chicken pox    GERD (gastroesophageal reflux disease)    OTC   Hypertension    Leaking of urine    Low iron    Mumps    Past Surgical History:  Procedure Laterality Date   CESAREAN SECTION     4 cs   LAPAROSCOPY  03/04/2012   Procedure: LAPAROSCOPY OPERATIVE;  Surgeon: Hal Morales, MD;  Location: [facility];  Service: Gynecology;  Laterality: N/A;  Peritoneum Biopsy   SHOULDER ARTHROSCOPY Right 03/20/2022   Procedure: RIGHT SHOULDER ARTHROSCOPY WITH DISTAL CLAVICLE RESECTION;  Surgeon: Huel Cote, MD;  Location: [facility];  Service: Orthopedics;  Laterality: Right;   TUBAL LIGATION Bilateral    Patient Active Problem List   Diagnosis Date Noted   Special screening for malignant neoplasms, colon 09/17/2023   Osteoarthritis of AC (acromioclavicular) joint    Endolymphatic hydrops of right ear 08/18/2020   Right-sided sensorineural hearing loss 08/18/2020   Atrophic vaginitis 03/20/2017   Endometriosis 03/18/2012   Dyspareunia 02/01/2012    PCP: Jarold Motto   REFERRING PROVIDER: Huel Cote  REFERRING DIAG: L shoulder s/p arthroscopy, DCE and SAD  THERAPY DIAG:  Acute pain of left shoulder  Rationale for Evaluation and Treatment: Rehabilitation  ONSET DATE: Surgery 08/26/23  SUBJECTIVE:                                                                                                                                                                                       SUBJECTIVE STATEMENT:  10/21/2023 Pt states doing well. Shoulder sore in front/top, was more active this weekend and holding grand kids   Eval: L shoulder Arthrsoscopy 08/26/23 with DCE and SAD. She has MD f/u on Monday 09/09/23. She is doing fairly well, bandages in place today, has been icing. States difficulty sleeping due to positioning, has been  sleeping propped up in bed. Had previous R shoulder scope last year, R shoulder doing very well a this time.   Hand dominance: Right  PERTINENT HISTORY: None (previous R shoulder scope)   PAIN:  Are you having pain? Yes: NPRS scale: 2/10 Pain location: L shoulder  anterior Pain description: sore, painful Aggravating factors: sore at rest/post op, sleeping,  Relieving factors: none stated   PRECAUTIONS: None  RED FLAGS: None   WEIGHT BEARING RESTRICTIONS: No  FALLS:  Has patient fallen in last 6 months? No   PLOF: Independent  PATIENT GOALS: Decreased pain in shoulder, improved  use of arm.   NEXT MD VISIT:   OBJECTIVE:   DIAGNOSTIC FINDINGS:    PATIENT SURVEYS :   Eval:  FOTO  22 Visit  5:   54 Visit 12:   66  COGNITION: Overall cognitive status: Within functional limits for tasks assessed     SENSATION: WFL  POSTURE:  UPPER EXTREMITY ROM:   Active  PROM Right eval Left eval Left 09/02/23:  Left 09/17/22 Left 10/02/23  Shoulder flexion  125 148 170 (standing) wfl  Shoulder extension       Shoulder abduction     wfl  Shoulder adduction       Shoulder internal rotation       Shoulder external rotation  45 60 80 (at 90 deg abd) wfl  Elbow flexion       Elbow extension       Wrist flexion       Wrist extension       Wrist ulnar deviation       Wrist radial deviation       Wrist pronation       Wrist supination       (Blank rows = not tested)   UPPER  EXTREMITY MMT:   not tested due to surgery   MMT Right eval Left eval  Shoulder flexion  4  Shoulder extension    Shoulder abduction  4-  Shoulder adduction    Shoulder internal rotation    Shoulder external rotation  4  Middle trapezius    Lower trapezius    Elbow flexion    Elbow extension    Wrist flexion    Wrist extension    Wrist ulnar deviation    Wrist radial deviation    Wrist pronation    Wrist supination    Grip strength (lbs)    (Blank rows = not tested)    JOINT MOBILITY TESTING:    PALPATION:    TODAY'S TREATMENT:                                                                                                                                         DATE:   10/21/2023  Therapeutic Exercise: Aerobic:  Supine:  Horiz abd x 15, 2lb   Quadruped:   S/L:  abd to 90 deg   x 10, full x 10;  Seated:   Standing:   full arom/abd palms fwd x 10;    with RTB 2 x 10;  Row x 15 GTB;  wall push ups x 10;  Stretches:      Neuromuscular Re-education: Manual Therapy:   cervical ROM, UT stretches, light cervical distraction LAD;  STM/TPR to R pec Self Care:     Therapeutic Exercise: Aerobic:  Supine:  SA reaches x 10;   Chest press 3 lb x 15;  Quadruped:   S/L:   horiz abd x 10;   ER  2lb x 10; x10 no weight. , abd x 12 Seated:   Standing:    shoulder ER ROM x 10,  with RTB 2 x 10;  Row x 15 GTB Stretches:    Education on wrist ext stretch for HEP x 2 min.  Neuromuscular Re-education: At mirror:  shoulder flexion arom/full x 10,  abd to 90 deg x 10;  military press (Rom) x 10;  pec stretch x 2 min with wrist rom for nerve glide. Manual Therapy:   cervical ROM, UT stretches, light cervical distraction LAD;  R shoulder, R shoulder  Self Care:     Therapeutic Exercise: Aerobic: Supine:  AROM/flexion  x10  S/L:    Seated:  pulley x 15 flex and scaption.  Standing:    shoulder ER ROM x 10,  with YTB 2 x 10; row GTB x 20  shoulder flexion arom/full x 10,   abd/scaption/full x 10 Stretches:   Neuromuscular Re-education: Manual Therapy:   cervical ROM, UT stretches, light cervical distraction  Therapeutic Activity: Self Care:    Therapeutic Exercise: Aerobic: Supine:  AROM/flexion  x20 5" holds S/L:    Seated:  pulley x 15 flex and scaption.  Standing:    wall slides x 15 on L;    shoulder ER YTB 2 x 15;  shoulder flexion with posterior wall support 2x10 slow and controlled B Stretches:  lat stretch at railing x 3;  Neuromuscular Re-education: Manual Therapy: PROM for shoulder flex and ER.  Therapeutic Activity: Self Care:    PATIENT EDUCATION:  Education details: updated and reviewed HEP Person educated: Patient Education method: Explanation, Demonstration, Tactile cues, Verbal cues, and Handouts Education comprehension: verbalized understanding, returned demonstration, verbal cues required, tactile cues required, and needs further education   HOME EXERCISE PROGRAM: Access Code: [email or web address]   ASSESSMENT:  CLINICAL IMPRESSION:  10/21/2023  Pt continues to progress well. She does have more soreness in pec and anteiror/superior shoulder today. No increased pain with movement or ther ex, but general soreness. Some relief after manual and pec work. She has mild improvements in ability for abduction rom, still with some soreness and limitation. Plan to progress as able.   Eval:  Patient presents with deficits following L shoulder arthroscopy with SAD and DCE on 08/26/23. Bandages replaced with tegaderm today. She has limited ROM for prom and arom, as well as weakness in L shoulder. She has significant limitation in ability for any functional activity, lifting, carrying, ADLs or IADLS at this time due to surgery.  Pt to benefit from skilled PT to improve deficits and pain.    OBJECTIVE IMPAIRMENTS: decreased activity tolerance, decreased knowledge of use of DME, decreased mobility, decreased ROM, decreased strength, impaired UE  functional use, and pain.   ACTIVITY LIMITATIONS: carrying, lifting, sleeping, bed mobility, bathing, toileting, dressing, self feeding, reach over head, hygiene/grooming, locomotion level, and  caring for others  PARTICIPATION LIMITATIONS: meal prep, cleaning, laundry, driving, shopping, community activity, and yard work  PERSONAL FACTORS:  none  are also affecting patient's functional outcome.   REHAB POTENTIAL: Good  CLINICAL DECISION MAKING: Stable/uncomplicated  EVALUATION COMPLEXITY: Low  GOALS: Goals reviewed with patient? Yes   SHORT TERM GOALS: Target date: 09/26/2023   Pt to be independent with initial HEP  Goal status: MET  2.  Pt to demo ability for full PROM of L shoulder   Goal status: MET    LONG TERM GOALS: Target date: 10/24/2023   Pt to be independent with final HEP  Goal status: partially met   2.  Pt to demo improved AROM to be [facility] and pain free, to improve ability for ADLs.   Goal status: partially met   3.  Pt to demo improved strength of L shoulder to be at least 4/5, to improve ability for IADLS and ADLS.   Goal status: partially met   4.  Pt to demo ability for functional reach, lift, carry , up to 5 lb without deficits, for improved ability for IADLs and community activities.   Goal status: partially met     PLAN: PT FREQUENCY: 1-2x/week  PT DURATION: 8 weeks  PLANNED INTERVENTIONS: Therapeutic exercises, Therapeutic activity, Neuromuscular re-education, Patient/Family education, Self Care, Joint mobilization, Joint manipulation, Stair training, DME instructions, Aquatic Therapy, Dry Needling, Electrical stimulation, Cryotherapy, Moist heat, Taping, Ultrasound, Ionotophoresis 4mg /ml Dexamethasone, Manual therapy,  Vasopneumatic device, Traction, Spinal manipulation, Spinal mobilization,    PLAN FOR NEXT SESSION:  AROM, strength as tolerated.    Sedalia Muta, PT, DPT 10:17 AM  10/21/23

## 2023-10-29 ENCOUNTER — Other Ambulatory Visit (HOSPITAL_BASED_OUTPATIENT_CLINIC_OR_DEPARTMENT_OTHER): Payer: Self-pay

## 2023-10-31 ENCOUNTER — Ambulatory Visit: Payer: Medicare Other | Admitting: Physical Therapy

## 2023-10-31 ENCOUNTER — Encounter: Payer: Self-pay | Admitting: Physical Therapy

## 2023-10-31 DIAGNOSIS — M25512 Pain in left shoulder: Secondary | ICD-10-CM

## 2023-10-31 NOTE — Therapy (Signed)
 OUTPATIENT PHYSICAL THERAPY UPPER EXTREMITY TREATMENT/Re-Cert    Patient Name: Samantha Becker MRN: [id number] DOB:Aug 06, 1956, 68 y.o., female Today's Date: 10/31/2023   END OF SESSION:  PT End of Session - 10/31/23 0933     Visit Number 15    Number of Visits 16    Date for PT Re-Evaluation 12/12/23    Authorization Type Medicare . PN done at visit 10, recert done at visit 15.    PT Start Time 0935    PT Stop Time 1015    PT Time Calculation (min) 40 min    Activity Tolerance Patient tolerated treatment well    Behavior During Therapy WFL for tasks assessed/performed                 Past Medical History:  Diagnosis Date   Arthritis    Chicken pox    GERD (gastroesophageal reflux disease)    OTC   Hypertension    Leaking of urine    Low iron    Mumps    Past Surgical History:  Procedure Laterality Date   CESAREAN SECTION     4 cs   LAPAROSCOPY  03/04/2012   Procedure: LAPAROSCOPY OPERATIVE;  Surgeon: Hal Morales, MD;  Location: [facility];  Service: Gynecology;  Laterality: N/A;  Peritoneum Biopsy   SHOULDER ARTHROSCOPY Right 03/20/2022   Procedure: RIGHT SHOULDER ARTHROSCOPY WITH DISTAL CLAVICLE RESECTION;  Surgeon: Huel Cote, MD;  Location: [facility];  Service: Orthopedics;  Laterality: Right;   TUBAL LIGATION Bilateral    Patient Active Problem List   Diagnosis Date Noted   Special screening for malignant neoplasms, colon 09/17/2023   Osteoarthritis of AC (acromioclavicular) joint    Endolymphatic hydrops of right ear 08/18/2020   Right-sided sensorineural hearing loss 08/18/2020   Atrophic vaginitis 03/20/2017   Endometriosis 03/18/2012   Dyspareunia 02/01/2012    PCP: Jarold Motto   REFERRING PROVIDER: Huel Cote  REFERRING DIAG: L shoulder s/p arthroscopy, DCE and SAD  THERAPY DIAG:  Acute pain of left shoulder  Rationale for Evaluation and Treatment: Rehabilitation  ONSET DATE: Surgery  08/26/23  SUBJECTIVE:                                                                                                                                                                                      SUBJECTIVE STATEMENT:  10/31/2023 Pt states doing well. Shoulder with mild soreness. Thinks that tingling in R hand is improving. Has been very minimal in L.   Eval: L shoulder Arthrsoscopy 08/26/23 with DCE and SAD. She has MD f/u on Monday 09/09/23. She is doing fairly well, bandages in place today,  has been icing. States difficulty sleeping due to positioning, has been sleeping propped up in bed. Had previous R shoulder scope last year, R shoulder doing very well a this time.   Hand dominance: Right  PERTINENT HISTORY: None (previous R shoulder scope)   PAIN:  Are you having pain? Yes: NPRS scale: 2/10 Pain location: L shoulder  anterior Pain description: sore, painful Aggravating factors: sore at rest/post op, sleeping,  Relieving factors: none stated   PRECAUTIONS: None  RED FLAGS: None   WEIGHT BEARING RESTRICTIONS: No  FALLS:  Has patient fallen in last 6 months? No   PLOF: Independent  PATIENT GOALS: Decreased pain in shoulder, improved  use of arm.   NEXT MD VISIT:   OBJECTIVE:   DIAGNOSTIC FINDINGS:    PATIENT SURVEYS :   Eval:  FOTO  22 Visit  5:   54 Visit 12:   65  COGNITION: Overall cognitive status: Within functional limits for tasks assessed     SENSATION: WFL  POSTURE:  UPPER EXTREMITY ROM:   Active  PROM Right eval Left eval Left 09/02/23:  Left 09/17/22 Left 10/02/23  Shoulder flexion  125 148 170 (standing) wfl  Shoulder extension       Shoulder abduction     wfl  Shoulder adduction       Shoulder internal rotation       Shoulder external rotation  45 60 80 (at 90 deg abd) wfl  Elbow flexion       Elbow extension       Wrist flexion       Wrist extension       Wrist ulnar deviation       Wrist radial deviation       Wrist  pronation       Wrist supination       (Blank rows = not tested)   UPPER EXTREMITY MMT:   not tested due to surgery   MMT Right eval Left eval Left 10/31/23  Shoulder flexion  4 4+  Shoulder extension     Shoulder abduction  4- 4-/mild pain  Shoulder adduction     Shoulder internal rotation     Shoulder external rotation  4 4+  Middle trapezius     Lower trapezius     Elbow flexion     Elbow extension     Wrist flexion     Wrist extension     Wrist ulnar deviation     Wrist radial deviation     Wrist pronation     Wrist supination     Grip strength (lbs)     (Blank rows = not tested)    JOINT MOBILITY TESTING:    PALPATION:    TODAY'S TREATMENT:  DATE:   10/31/2023  Therapeutic Exercise: Aerobic:  Supine:   Quadruped:  SA press x 5;  High plank on knees 30 sec ;  high plank on feet 30 sec x 2;  S/L:    Seated:   Standing:   arom/abd/to 90 deg;  palms fwd x 10, palms down x 10;    ER with RTB 2 x 10;  Row x 15 GTB;   Flexion arom/full 2 x 10;  Stretches:      Neuromuscular Re-education: Manual Therapy:   L shoulder LAD, Inf and post mobs,   STM/TPR to R pec Self Care:     Therapeutic Exercise: Aerobic:  Supine:  Horiz abd x 15, 2lb   Quadruped:   S/L:   abd to 90 deg   x 10, full x 10;  Seated:   Standing:   full arom/abd palms fwd x 10;    with RTB 2 x 10;  Row x 15 GTB;  wall push ups x 10;  Stretches:      Neuromuscular Re-education: Manual Therapy:   cervical ROM, UT stretches, light cervical distraction LAD;  STM/TPR to R pec Self Care:     Therapeutic Exercise: Aerobic:  Supine:  SA reaches x 10;   Chest press 3 lb x 15;  Quadruped:   S/L:   horiz abd x 10;   ER  2lb x 10; x10 no weight. , abd x 12 Seated:   Standing:    shoulder ER ROM x 10,  with RTB 2 x 10;  Row x 15 GTB Stretches:    Education on wrist  ext stretch for HEP x 2 min.  Neuromuscular Re-education: At mirror:  shoulder flexion arom/full x 10,  abd to 90 deg x 10;  military press (Rom) x 10;  pec stretch x 2 min with wrist rom for nerve glide. Manual Therapy:   cervical ROM, UT stretches, light cervical distraction LAD;  R shoulder, R shoulder  Self Care:    PATIENT EDUCATION:  Education details: updated and reviewed HEP Person educated: Patient Education method: Explanation, Demonstration, Tactile cues, Verbal cues, and Handouts Education comprehension: verbalized understanding, returned demonstration, verbal cues required, tactile cues required, and needs further education   HOME EXERCISE PROGRAM: Access Code: [email or web address]   ASSESSMENT:  CLINICAL IMPRESSION:  10/31/2023 Pt has been seen for 15 visits. continues to progress well. She does have more soreness in pec and anteiror/superior shoulder at times. She has mild discomfort with end range flexion and abduction, and some weakness for abd motion. She is doing very well functionally and with strengthening. She is progressing to meet goals, but will require continued care to meet LTGs, and for full/pain free ROM for reach, lift, carry and IADLs.   Eval:  Patient presents with deficits following L shoulder arthroscopy with SAD and DCE on 08/26/23. Bandages replaced with tegaderm today. She has limited ROM for prom and arom, as well as weakness in L shoulder. She has significant limitation in ability for any functional activity, lifting, carrying, ADLs or IADLS at this time due to surgery.  Pt to benefit from skilled PT to improve deficits and pain.    OBJECTIVE IMPAIRMENTS: decreased activity tolerance, decreased knowledge of use of DME, decreased mobility, decreased ROM, decreased strength, impaired UE functional use, and pain.   ACTIVITY LIMITATIONS: carrying, lifting, sleeping, bed mobility, bathing, toileting, dressing, self feeding, reach over head, hygiene/grooming,  locomotion level, and caring for others  PARTICIPATION LIMITATIONS: meal prep, cleaning,  laundry, driving, shopping, community activity, and yard work  PERSONAL FACTORS:  none  are also affecting patient's functional outcome.   REHAB POTENTIAL: Good  CLINICAL DECISION MAKING: Stable/uncomplicated  EVALUATION COMPLEXITY: Low  GOALS: Goals reviewed with patient? Yes   SHORT TERM GOALS: Target date: 09/26/2023   Pt to be independent with initial HEP  Goal status: MET  2.  Pt to demo ability for full PROM of L shoulder   Goal status: MET    LONG TERM GOALS: Target date: 12/12/23   Pt to be independent with final HEP  Goal status: partially met   2.  Pt to demo improved AROM to be [facility] and pain free, to improve ability for ADLs.   Goal status: partially met   3.  Pt to demo improved strength of L shoulder to be at least 4/5, to improve ability for IADLS and ADLS.   Goal status: partially met   4.  Pt to demo ability for functional reach, lift, carry , up to 5 lb without deficits, for improved ability for IADLs and community activities.   Goal status: partially met     PLAN: PT FREQUENCY: 1-2x/week  PT DURATION: 6 weeks  PLANNED INTERVENTIONS: Therapeutic exercises, Therapeutic activity, Neuromuscular re-education, Patient/Family education, Self Care, Joint mobilization, Joint manipulation, Stair training, DME instructions, Aquatic Therapy, Dry Needling, Electrical stimulation, Cryotherapy, Moist heat, Taping, Ultrasound, Ionotophoresis 4mg /ml Dexamethasone, Manual therapy,  Vasopneumatic device, Traction, Spinal manipulation, Spinal mobilization,    PLAN FOR NEXT SESSION:  AROM, strength as tolerated.    Sedalia Muta, PT, DPT 3:14 PM  10/31/23

## 2023-11-07 ENCOUNTER — Ambulatory Visit (INDEPENDENT_AMBULATORY_CARE_PROVIDER_SITE_OTHER): Payer: Medicare Other | Admitting: Physical Therapy

## 2023-11-07 ENCOUNTER — Encounter: Payer: Self-pay | Admitting: Physical Therapy

## 2023-11-07 DIAGNOSIS — M25512 Pain in left shoulder: Secondary | ICD-10-CM | POA: Diagnosis not present

## 2023-11-07 NOTE — Therapy (Signed)
 OUTPATIENT PHYSICAL THERAPY UPPER EXTREMITY TREATMENT/Re-Cert    Patient Name: Samantha Becker MRN: [id number] DOB:[birth year], 68 y.o., female Today's Date: 11/07/2023   END OF SESSION:  PT End of Session - 11/07/23 1301     Visit Number 16    Number of Visits 24    Date for PT Re-Evaluation 12/12/23    Authorization Type Medicare . PN done at visit 10, recert done at visit 15.    PT Start Time 1303    PT Stop Time 1345    PT Time Calculation (min) 42 min    Activity Tolerance Patient tolerated treatment well    Behavior During Therapy WFL for tasks assessed/performed                 Past Medical History:  Diagnosis Date   Arthritis    Chicken pox    GERD (gastroesophageal reflux disease)    OTC   Hypertension    Leaking of urine    Low iron    Mumps    Past Surgical History:  Procedure Laterality Date   CESAREAN SECTION     4 cs   LAPAROSCOPY  03/04/2012   Procedure: LAPAROSCOPY OPERATIVE;  Surgeon: Hal Morales, MD;  Location: [facility];  Service: Gynecology;  Laterality: N/A;  Peritoneum Biopsy   SHOULDER ARTHROSCOPY Right 03/20/2022   Procedure: RIGHT SHOULDER ARTHROSCOPY WITH DISTAL CLAVICLE RESECTION;  Surgeon: Huel Cote, MD;  Location: [facility];  Service: Orthopedics;  Laterality: Right;   TUBAL LIGATION Bilateral    Patient Active Problem List   Diagnosis Date Noted   Special screening for malignant neoplasms, colon 09/17/2023   Osteoarthritis of AC (acromioclavicular) joint    Endolymphatic hydrops of right ear 08/18/2020   Right-sided sensorineural hearing loss 08/18/2020   Atrophic vaginitis 03/20/2017   Endometriosis 03/18/2012   Dyspareunia 02/01/2012    PCP: Jarold Motto   REFERRING PROVIDER: Huel Cote  REFERRING DIAG: L shoulder s/p arthroscopy, DCE and SAD  THERAPY DIAG:  Acute pain of left shoulder  Rationale for Evaluation and Treatment: Rehabilitation  ONSET DATE: Surgery  08/26/23  SUBJECTIVE:                                                                                                                                                                                      SUBJECTIVE STATEMENT:  11/07/2023 Pt states doing well. Shoulder with only mild soreness.   Eval: L shoulder Arthrsoscopy 08/26/23 with DCE and SAD. She has MD f/u on Monday 09/09/23. She is doing fairly well, bandages in place today, has been icing. States difficulty sleeping due to positioning, has been sleeping propped  up in bed. Had previous R shoulder scope last year, R shoulder doing very well a this time.   Hand dominance: Right  PERTINENT HISTORY: None (previous R shoulder scope)   PAIN:  Are you having pain? Yes: NPRS scale: 2/10 Pain location: L shoulder  anterior Pain description: sore, painful Aggravating factors: sore at rest/post op, sleeping,  Relieving factors: none stated   PRECAUTIONS: None  RED FLAGS: None   WEIGHT BEARING RESTRICTIONS: No  FALLS:  Has patient fallen in last 6 months? No   PLOF: Independent  PATIENT GOALS: Decreased pain in shoulder, improved  use of arm.   NEXT MD VISIT:   OBJECTIVE:   DIAGNOSTIC FINDINGS:    PATIENT SURVEYS :   Eval:  FOTO  22 Visit  5:   54 Visit 12:   27  COGNITION: Overall cognitive status: Within functional limits for tasks assessed     SENSATION: WFL  POSTURE:  UPPER EXTREMITY ROM:   Active  PROM Right eval Left eval Left 09/02/23:  Left 09/17/22 Left 10/02/23  Shoulder flexion  125 148 170 (standing) wfl  Shoulder extension       Shoulder abduction     wfl  Shoulder adduction       Shoulder internal rotation       Shoulder external rotation  45 60 80 (at 90 deg abd) wfl  Elbow flexion       Elbow extension       Wrist flexion       Wrist extension       Wrist ulnar deviation       Wrist radial deviation       Wrist pronation       Wrist supination       (Blank rows = not  tested)   UPPER EXTREMITY MMT:   not tested due to surgery   MMT Right eval Left eval Left 10/31/23  Shoulder flexion  4 4+  Shoulder extension     Shoulder abduction  4- 4-/mild pain  Shoulder adduction     Shoulder internal rotation     Shoulder external rotation  4 4+  Middle trapezius     Lower trapezius     Elbow flexion     Elbow extension     Wrist flexion     Wrist extension     Wrist ulnar deviation     Wrist radial deviation     Wrist pronation     Wrist supination     Grip strength (lbs)     (Blank rows = not tested)    JOINT MOBILITY TESTING:    PALPATION:    TODAY'S TREATMENT:                                                                                                                                         DATE:   11/07/2023  Therapeutic Exercise: Aerobic:  Supine:   Shoulder ER 90 deg 2 x 10 for rom ;  shoulder ER / 90 deg 2 lb x 15 for strength Quadruped:  SA press x 10;   high plank on feet 30 sec x 2;  S/L:   abd x 15 Seated:   Standing:    Low Row x 15 RTB;   IR behind back 3 x 5 2 hand hold;  Stretches:      Neuromuscular Re-education: Manual Therapy:   L shoulder LAD,  Ther activity:  UBE x 4 min, fwd/bwd,   arom/abd/to 90 deg; Abd arom/ full x 15;   Flexion arom/full x 15; Shoulder press 3lb x 10 bil;   90/90 flexion x 10;   Wall push ups x 15;  Strength and ROM for reaching and pushing  Self Care:     Therapeutic Exercise: Aerobic:  Supine:   Quadruped:  SA press x 5;  High plank on knees 30 sec ;  high plank on feet 30 sec x 2;  S/L:    Seated:   Standing:   arom/abd/to 90 deg;  palms fwd x 10, palms down x 10;    ER with RTB 2 x 10;  Row x 15 GTB;   Flexion arom/full 2 x 10;  Stretches:      Neuromuscular Re-education: Manual Therapy:   L shoulder LAD, Inf and post mobs,   STM/TPR to R pec Self Care:     Therapeutic Exercise: Aerobic:  Supine:  Horiz abd x 15, 2lb   Quadruped:   S/L:   abd to 90 deg   x 10, full x  10;  Seated:   Standing:   full arom/abd palms fwd x 10;    with RTB 2 x 10;  Row x 15 GTB;  wall push ups x 10;  Stretches:      Neuromuscular Re-education: Manual Therapy:   cervical ROM, UT stretches, light cervical distraction LAD;  STM/TPR to R pec Self Care:     Therapeutic Exercise: Aerobic:  Supine:  SA reaches x 10;   Chest press 3 lb x 15;  Quadruped:   S/L:   horiz abd x 10;   ER  2lb x 10; x10 no weight. , abd x 12 Seated:   Standing:    shoulder ER ROM x 10,  with RTB 2 x 10;  Row x 15 GTB Stretches:    Education on wrist ext stretch for HEP x 2 min.  Neuromuscular Re-education: At mirror:  shoulder flexion arom/full x 10,  abd to 90 deg x 10;  military press (Rom) x 10;  pec stretch x 2 min with wrist rom for nerve glide. Manual Therapy:   cervical ROM, UT stretches, light cervical distraction LAD;  R shoulder, R shoulder  Self Care:    PATIENT EDUCATION:  Education details: updated and reviewed HEP Person educated: Patient Education method: Explanation, Demonstration, Tactile cues, Verbal cues, and Handouts Education comprehension: verbalized understanding, returned demonstration, verbal cues required, tactile cues required, and needs further education   HOME EXERCISE PROGRAM: Access Code: [email or web address]   ASSESSMENT:  CLINICAL IMPRESSION:  11/07/2023 Pt progressing well. She has less soreness in pec region today. She has improving ability for full ROM without pain. Er at 90 deg for rom and strength today. Pt with mild strength deficits but overall with good function that should continue to improve with use of arm. Pt to f/u in 2 weeks, possible d/c  at that time, if still doing well.   Eval:  Patient presents with deficits following L shoulder arthroscopy with SAD and DCE on 08/26/23. Bandages replaced with tegaderm today. She has limited ROM for prom and arom, as well as weakness in L shoulder. She has significant limitation in ability for any functional activity,  lifting, carrying, ADLs or IADLS at this time due to surgery.  Pt to benefit from skilled PT to improve deficits and pain.    OBJECTIVE IMPAIRMENTS: decreased activity tolerance, decreased knowledge of use of DME, decreased mobility, decreased ROM, decreased strength, impaired UE functional use, and pain.   ACTIVITY LIMITATIONS: carrying, lifting, sleeping, bed mobility, bathing, toileting, dressing, self feeding, reach over head, hygiene/grooming, locomotion level, and caring for others  PARTICIPATION LIMITATIONS: meal prep, cleaning, laundry, driving, shopping, community activity, and yard work  PERSONAL FACTORS:  none  are also affecting patient's functional outcome.   REHAB POTENTIAL: Good  CLINICAL DECISION MAKING: Stable/uncomplicated  EVALUATION COMPLEXITY: Low  GOALS: Goals reviewed with patient? Yes   SHORT TERM GOALS: Target date: 09/26/2023   Pt to be independent with initial HEP  Goal status: MET  2.  Pt to demo ability for full PROM of L shoulder   Goal status: MET    LONG TERM GOALS: Target date: 12/12/23   Pt to be independent with final HEP  Goal status: partially met   2.  Pt to demo improved AROM to be [facility] and pain free, to improve ability for ADLs.   Goal status: partially met   3.  Pt to demo improved strength of L shoulder to be at least 4/5, to improve ability for IADLS and ADLS.   Goal status: partially met   4.  Pt to demo ability for functional reach, lift, carry , up to 5 lb without deficits, for improved ability for IADLs and community activities.   Goal status: partially met     PLAN: PT FREQUENCY: 1-2x/week  PT DURATION: 6 weeks  PLANNED INTERVENTIONS: Therapeutic exercises, Therapeutic activity, Neuromuscular re-education, Patient/Family education, Self Care, Joint mobilization, Joint manipulation, Stair training, DME instructions, Aquatic Therapy, Dry Needling, Electrical stimulation, Cryotherapy, Moist heat, Taping, Ultrasound,  Ionotophoresis 4mg /ml Dexamethasone, Manual therapy,  Vasopneumatic device, Traction, Spinal manipulation, Spinal mobilization,    PLAN FOR NEXT SESSION:  AROM, strength as tolerated.    Sedalia Muta, PT, DPT 1:32 PM  11/07/23

## 2023-11-12 ENCOUNTER — Encounter: Payer: Medicare Other | Admitting: Physical Therapy

## 2023-11-20 ENCOUNTER — Encounter: Payer: Self-pay | Admitting: Physical Therapy

## 2023-11-20 ENCOUNTER — Ambulatory Visit (INDEPENDENT_AMBULATORY_CARE_PROVIDER_SITE_OTHER): Admitting: Physical Therapy

## 2023-11-20 DIAGNOSIS — M25512 Pain in left shoulder: Secondary | ICD-10-CM | POA: Diagnosis not present

## 2023-11-20 NOTE — Therapy (Signed)
 OUTPATIENT PHYSICAL THERAPY UPPER EXTREMITY TREATMENT/D/C    Patient Name: Samantha Becker MRN: [id number] DOB:1956-07-22, 68 y.o., female Today's Date: 11/20/2023   END OF SESSION:  PT End of Session - 11/20/23 1304     Visit Number 17    Number of Visits 24    Date for PT Re-Evaluation 12/12/23    Authorization Type Medicare . PN done at visit 10, recert done at visit 15.    PT Start Time 1305    PT Stop Time 1345    PT Time Calculation (min) 40 min    Activity Tolerance Patient tolerated treatment well    Behavior During Therapy WFL for tasks assessed/performed                 Past Medical History:  Diagnosis Date   Arthritis    Chicken pox    GERD (gastroesophageal reflux disease)    OTC   Hypertension    Leaking of urine    Low iron    Mumps    Past Surgical History:  Procedure Laterality Date   CESAREAN SECTION     4 cs   LAPAROSCOPY  03/04/2012   Procedure: LAPAROSCOPY OPERATIVE;  Surgeon: Hal Morales, MD;  Location: [facility];  Service: Gynecology;  Laterality: N/A;  Peritoneum Biopsy   SHOULDER ARTHROSCOPY Right 03/20/2022   Procedure: RIGHT SHOULDER ARTHROSCOPY WITH DISTAL CLAVICLE RESECTION;  Surgeon: Huel Cote, MD;  Location: [facility];  Service: Orthopedics;  Laterality: Right;   TUBAL LIGATION Bilateral    Patient Active Problem List   Diagnosis Date Noted   Special screening for malignant neoplasms, colon 09/17/2023   Osteoarthritis of AC (acromioclavicular) joint    Endolymphatic hydrops of right ear 08/18/2020   Right-sided sensorineural hearing loss 08/18/2020   Atrophic vaginitis 03/20/2017   Endometriosis 03/18/2012   Dyspareunia 02/01/2012    PCP: Jarold Motto   REFERRING PROVIDER: Huel Cote  REFERRING DIAG: L shoulder s/p arthroscopy, DCE and SAD  THERAPY DIAG:  Acute pain of left shoulder  Rationale for Evaluation and Treatment: Rehabilitation  ONSET DATE: Surgery  08/26/23  SUBJECTIVE:                                                                                                                                                                                      SUBJECTIVE STATEMENT:  11/20/2023 Pt states doing well. Shoulder with only mild soreness at times with certain motions/reaches. She has been doing HEP. Only taking tylenol once in a while at night.   Eval: L shoulder Arthrsoscopy 08/26/23 with DCE and SAD. She has MD f/u on Monday 09/09/23. She is doing  fairly well, bandages in place today, has been icing. States difficulty sleeping due to positioning, has been sleeping propped up in bed. Had previous R shoulder scope last year, R shoulder doing very well a this time.   Hand dominance: Right  PERTINENT HISTORY: None (previous R shoulder scope)   PAIN:  Are you having pain? Yes: NPRS scale: 0-2/10 Pain location: L shoulder  anterior Pain description: sore, painful Aggravating factors: sore at rest/post op, sleeping,  Relieving factors: none stated   PRECAUTIONS: None  RED FLAGS: None   WEIGHT BEARING RESTRICTIONS: No  FALLS:  Has patient fallen in last 6 months? No   PLOF: Independent  PATIENT GOALS: Decreased pain in shoulder, improved  use of arm.   NEXT MD VISIT:   OBJECTIVE:   DIAGNOSTIC FINDINGS:    PATIENT SURVEYS :   Eval:  FOTO  22 Visit  5:   54 Visit 12:   65 Visit 17:   61.5    COGNITION: Overall cognitive status: Within functional limits for tasks assessed     SENSATION: WFL  POSTURE:  UPPER EXTREMITY ROM:   Active  PROM Right eval Left eval Left 09/02/23:  Left 09/17/22 Left 10/02/23  Shoulder flexion  125 148 170 (standing) wfl  Shoulder extension       Shoulder abduction     wfl  Shoulder adduction       Shoulder internal rotation       Shoulder external rotation  45 60 80 (at 90 deg abd) wfl  Elbow flexion       Elbow extension       Wrist flexion       Wrist extension       Wrist  ulnar deviation       Wrist radial deviation       Wrist pronation       Wrist supination       (Blank rows = not tested)   UPPER EXTREMITY MMT:   not tested due to surgery   MMT Right eval Left eval Left 11/20/23  Shoulder flexion  4 4+  Shoulder extension     Shoulder abduction  4- 4/ mild pain  Shoulder adduction     Shoulder internal rotation     Shoulder external rotation  4 4+  Middle trapezius     Lower trapezius     Elbow flexion     Elbow extension     Wrist flexion     Wrist extension     Wrist ulnar deviation     Wrist radial deviation     Wrist pronation     Wrist supination     Grip strength (lbs)     (Blank rows = not tested)    JOINT MOBILITY TESTING:    PALPATION:    TODAY'S TREATMENT:  DATE:   11/20/2023  Therapeutic Exercise: Aerobic:  Supine:    Quadruped:   S/L:   Seated:   Standing:    Low Row x 15 GTB; Row Blue TB x 20;   IR behind back 3 x 5 2 hand hold;  Nerve glides med, ulnar x 10 ea on R;  review for pec stretch Stretches:      Neuromuscular Re-education: Manual Therapy:   L shoulder LAD, Pec stretch,  Ther activity:  UBE x 4 min, fwd/bwd,    Flex and Abd arom/ full x 15;   bd to 90 deg 2 x 10;   Wall push ups x 15;  Strength and ROM for reaching and pushing  Self Care:     PATIENT EDUCATION:  Education details: updated and reviewed HEP Person educated: Patient Education method: Explanation, Demonstration, Tactile cues, Verbal cues, and Handouts Education comprehension: verbalized understanding, returned demonstration, verbal cues required, tactile cues required, and needs further education   HOME EXERCISE PROGRAM: Access Code: [email or web address]   ASSESSMENT:  CLINICAL IMPRESSION:  11/20/2023 Pt has made very good progress. She has full ROM. She does have mild soreness in anterior shoulder  with abduction motion. No tenderness in bicep tendon to palpate. She has mild strength deficit for abduction. She also has ongoing/variable tingling sensation in R elbow and forearm. This was improved, but then slept on R elbow a couple weeks ago, and has been more sore since. She is already doing pec stretch, neck stretch, and nerve glides for this. Discussed f/u with MD as needed for L shoulder tenderness or R elbow tingling. Otherwise, she is doing very well functionally with shoulder, and has made good progress. She will continue to work on strengthening with HEP an her exercise class. Updated and reviewed final HEP today. Pt with good understanding. Pt ready for d/c at this time, has met goals. Pt in agreement with plan.   Eval:  Patient presents with deficits following L shoulder arthroscopy with SAD and DCE on 08/26/23. Bandages replaced with tegaderm today. She has limited ROM for prom and arom, as well as weakness in L shoulder. She has significant limitation in ability for any functional activity, lifting, carrying, ADLs or IADLS at this time due to surgery.  Pt to benefit from skilled PT to improve deficits and pain.    OBJECTIVE IMPAIRMENTS: decreased activity tolerance, decreased knowledge of use of DME, decreased mobility, decreased ROM, decreased strength, impaired UE functional use, and pain.   ACTIVITY LIMITATIONS: carrying, lifting, sleeping, bed mobility, bathing, toileting, dressing, self feeding, reach over head, hygiene/grooming, locomotion level, and caring for others  PARTICIPATION LIMITATIONS: meal prep, cleaning, laundry, driving, shopping, community activity, and yard work  PERSONAL FACTORS:  none  are also affecting patient's functional outcome.   REHAB POTENTIAL: Good  CLINICAL DECISION MAKING: Stable/uncomplicated  EVALUATION COMPLEXITY: Low  GOALS: Goals reviewed with patient? Yes   SHORT TERM GOALS: Target date: 09/26/2023   Pt to be independent with initial  HEP  Goal status: MET  2.  Pt to demo ability for full PROM of L shoulder   Goal status: MET    LONG TERM GOALS: Target date: 12/12/23   Pt to be independent with final HEP  Goal status: MET  2.  Pt to demo improved AROM to be [facility] and pain free, to improve ability for ADLs.   Goal status: MET  3.  Pt to demo improved strength of L shoulder to be at least 4/5, to improve  ability for IADLS and ADLS.   Goal status: MET  4.  Pt to demo ability for functional reach, lift, carry , up to 5 lb without deficits, for improved ability for IADLs and community activities.   Goal status: partially MET, still weak wit abduction reach/lift.     PLAN: PT FREQUENCY: 1-2x/week  PT DURATION: 6 weeks  PLANNED INTERVENTIONS: Therapeutic exercises, Therapeutic activity, Neuromuscular re-education, Patient/Family education, Self Care, Joint mobilization, Joint manipulation, Stair training, DME instructions, Aquatic Therapy, Dry Needling, Electrical stimulation, Cryotherapy, Moist heat, Taping, Ultrasound, Ionotophoresis 4mg /ml Dexamethasone, Manual therapy,  Vasopneumatic device, Traction, Spinal manipulation, Spinal mobilization,    PLAN FOR NEXT SESSION:     Sedalia Muta, PT, DPT 1:05 PM  11/20/23  PHYSICAL THERAPY DISCHARGE SUMMARY  Visits from Start of Care: 17   Plan: Patient agrees to discharge.  Patient goals were  met. Patient is being discharged due to meeting the stated rehab goals.      Sedalia Muta, PT, DPT 2:03 PM  11/20/23

## 2023-11-28 ENCOUNTER — Other Ambulatory Visit (HOSPITAL_BASED_OUTPATIENT_CLINIC_OR_DEPARTMENT_OTHER): Payer: Self-pay

## 2023-11-29 ENCOUNTER — Encounter: Payer: Self-pay | Admitting: Gastroenterology

## 2023-11-29 ENCOUNTER — Ambulatory Visit: Payer: Medicare Other | Admitting: Gastroenterology

## 2023-11-29 VITALS — BP 115/72 | HR 67 | Temp 97.7°F | Resp 17 | Ht 62.0 in | Wt 135.0 lb

## 2023-11-29 DIAGNOSIS — D120 Benign neoplasm of cecum: Secondary | ICD-10-CM | POA: Diagnosis not present

## 2023-11-29 DIAGNOSIS — K641 Second degree hemorrhoids: Secondary | ICD-10-CM

## 2023-11-29 DIAGNOSIS — D125 Benign neoplasm of sigmoid colon: Secondary | ICD-10-CM

## 2023-11-29 DIAGNOSIS — Z1211 Encounter for screening for malignant neoplasm of colon: Secondary | ICD-10-CM | POA: Diagnosis not present

## 2023-11-29 MED ORDER — SODIUM CHLORIDE 0.9 % IV SOLN: 500.0000 mL | INTRAVENOUS | Status: AC

## 2023-11-29 NOTE — Progress Notes (Signed)
 Called to room to assist during endoscopic procedure.  Patient ID and intended procedure confirmed with present staff. Received instructions for my participation in the procedure from the performing physician.

## 2023-11-29 NOTE — Progress Notes (Signed)
 Pt's states no medical or surgical changes since previsit or office visit.

## 2023-11-29 NOTE — Progress Notes (Signed)
 Sedate, gd SR, tolerated procedure well, VSS, report to RN

## 2023-11-29 NOTE — Progress Notes (Signed)
 GASTROENTEROLOGY PROCEDURE H&P NOTE   Primary Care Physician: Jarold Motto, PA    Reason for Procedure:  Colon Cancer screening  Plan:    Colonoscopy  Patient is appropriate for endoscopic procedure(s) in the ambulatory ([facility]) setting.  The nature of the procedure, as well as the risks, benefits, and alternatives were carefully and thoroughly reviewed with the patient. Ample time for discussion and questions allowed. The patient understood, was satisfied, and agreed to proceed.     HPI: Samantha Becker is a 68 y.o. female who presents for colonoscopy for routine Colon Cancer screening.  No active GI symptoms.  No known family history of colon cancer or related malignancy.  Patient is otherwise without complaints or active issues today.  Last colonoscopy was 10 years ago at [facility] and normal per patient.   Past Medical History:  Diagnosis Date   Arthritis    Chicken pox    GERD (gastroesophageal reflux disease)    OTC   Hypertension    Leaking of urine    Low iron    Mumps     Past Surgical History:  Procedure Laterality Date   CESAREAN SECTION     4 cs   LAPAROSCOPY  03/04/2012   Procedure: LAPAROSCOPY OPERATIVE;  Surgeon: Hal Morales, MD;  Location: [facility];  Service: Gynecology;  Laterality: N/A;  Peritoneum Biopsy   SHOULDER ARTHROSCOPY Right 03/20/2022   Procedure: RIGHT SHOULDER ARTHROSCOPY WITH DISTAL CLAVICLE RESECTION;  Surgeon: Huel Cote, MD;  Location: [facility];  Service: Orthopedics;  Laterality: Right;   TUBAL LIGATION Bilateral     Prior to Admission medications   Medication Sig Start Date End Date Taking? Authorizing Provider  Calcium Carb-Cholecalciferol (CALCIUM 500 + D PO) Take 2 each by mouth daily in the afternoon.   Yes [provider]  conjugated estrogens (PREMARIN) vaginal cream Premarin 0.625 mg/gram vaginal cream  Insert 0.5 applicatorsful twice a week by vaginal route. 09/09/23  Yes Jarold Motto, PA  tretinoin (RETIN-A) 0.025 % cream Apply 1 Application topically every evening. 07/08/23  Yes [provider]  TURMERIC PO Take 3 tablets by mouth daily in the afternoon.   Yes [provider]  valsartan (DIOVAN) 80 MG tablet Take 1 tablet (80 mg total) by mouth daily. 08/02/23 01/29/24 Yes Jarold Motto, PA  aspirin EC 325 MG tablet Take 1 tablet (325 mg total) by mouth daily. 08/26/23   Huel Cote, MD  ibuprofen (ADVIL) 200 MG tablet Take 800 mg by mouth every 6 (six) hours as needed. Patient not taking: Reported on 11/29/2023    [provider]  triamcinolone cream (KENALOG) 0.1 % APPLY TO AFFECTED AREA 1-2 TIMES DAILY. 04/03/23   Jarold Motto, PA    Current Outpatient Medications  Medication Sig Dispense Refill   Calcium Carb-Cholecalciferol (CALCIUM 500 + D PO) Take 2 each by mouth daily in the afternoon.     conjugated estrogens (PREMARIN) vaginal cream Premarin 0.625 mg/gram vaginal cream  Insert 0.5 applicatorsful twice a week by vaginal route. 42.5 g 2   tretinoin (RETIN-A) 0.025 % cream Apply 1 Application topically every evening.     TURMERIC PO Take 3 tablets by mouth daily in the afternoon.     valsartan (DIOVAN) 80 MG tablet Take 1 tablet (80 mg total) by mouth daily. 30 tablet 5   aspirin EC 325 MG tablet Take 1 tablet (325 mg total) by mouth daily. 14 tablet 0   ibuprofen (ADVIL) 200 MG  tablet Take 800 mg by mouth every 6 (six) hours as needed. (Patient not taking: Reported on 11/29/2023)     triamcinolone cream (KENALOG) 0.1 % APPLY TO AFFECTED AREA 1-2 TIMES DAILY. 45 g 0   Current Facility-Administered Medications  Medication Dose Route Frequency Provider Last Rate Last Admin   0.9 %  sodium chloride infusion  500 mL Intravenous Continuous Lamyah Creed V, DO        Allergies as of 11/29/2023 - Review Complete 11/29/2023  Allergen Reaction Noted   Morphine Nausea Only 03/13/2022    Family History  Problem Relation Age of  Onset   Heart disease Mother    Hypertension Mother    Arthritis Mother    Breast cancer Mother    Osteoporosis Mother    Heart failure Mother    Heart disease Father    Hypertension Father    Cancer Father    Stroke Father    Arthritis Sister    Asthma Sister    Colon cancer Neg Hx    Esophageal cancer Neg Hx    Rectal cancer Neg Hx    Stomach cancer Neg Hx    Colon polyps Neg Hx     Social History   Socioeconomic History   Marital status: Married    Spouse name: Not on file   Number of children: Not on file   Years of education: Not on file   Highest education level: Not on file  Occupational History   Occupation: TEACHER     Employer: [employer]  Tobacco Use   Smoking status: Never   Smokeless tobacco: Never  Vaping Use   Vaping status: Never Used  Substance and Sexual Activity   Alcohol use: Yes    Alcohol/week: 4.0 standard drinks of alcohol    Types: 2 Glasses of wine, 2 Cans of beer per week    Comment: wine 3-4 times a week   Drug use: No   Sexual activity: Yes    Birth control/protection: Surgical, Post-menopausal    Comment: BTL  Other Topics Concern   Not on file  Social History Narrative   Prior Runner, broadcasting/film/video at [employer], now does tutoring -- once a week   Husband and her lives in home   Social Drivers of Health   Financial Resource Strain: Not on file  Food Insecurity: Not on file  Transportation Needs: Not on file  Physical Activity: Not on file  Stress: Not on file  Social Connections: Not on file  Intimate Partner Violence: Not on file    Physical Exam: Vital signs in last 24 hours: @BP  130/70   Pulse 75   Temp 97.7 F (36.5 C) (Temporal)   Ht 5\' 2"  (1.575 m)   Wt 135 lb (61.2 kg)   LMP  (LMP Unknown)   SpO2 100%   BMI 24.69 kg/m  GEN: NAD EYE: Sclerae anicteric ENT: MMM CV: Non-tachycardic Pulm: CTA b/l GI: Soft, NT/ND NEURO:  Alert & Oriented x 3   Doristine Locks, DO [facility]   11/29/2023 8:57  AM

## 2023-11-29 NOTE — Op Note (Signed)
  [facility] Patient Name: Samantha Becker Procedure Date: 11/29/2023 8:56 AM MRN: [id number] Endoscopist: Doristine Locks , MD, [id number] Age: 68 Referring MD:  Date of Birth: 12/09/1955 Gender: Female Account #: [id number] Procedure:                Colonoscopy Indications:              Screening for colorectal malignant neoplasm (last                            colonoscopy was 10 years ago).                           Last colonoscopy was 10 years ago at outside                            facility and normal per patient. Medicines:                Monitored Anesthesia Care Procedure:                Pre-Anesthesia Assessment:                           - Prior to the procedure, a History and Physical                            was performed, and patient medications and                            allergies were reviewed. The patient's tolerance of                            previous anesthesia was also reviewed. The risks                            and benefits of the procedure and the sedation                            options and risks were discussed with the patient.                            All questions were answered, and informed consent                            was obtained. Prior Anticoagulants: The patient has                            taken no anticoagulant or antiplatelet agents. ASA                            Grade Assessment: II - A patient with mild systemic                            disease. After reviewing the risks and benefits,  the patient was deemed in satisfactory condition to                            undergo the procedure.                           After obtaining informed consent, the colonoscope                            was passed under direct vision. Throughout the                            procedure, the patient's blood pressure, pulse, and                            oxygen saturations were monitored  continuously. The                            [id number]-HQ190L Colonoscope [id number] was introduced                            through the anus and advanced to the the cecum,                            identified by appendiceal orifice and ileocecal                            valve. The colonoscopy was performed without                            difficulty. The patient tolerated the procedure                            well. The quality of the bowel preparation was                            good. The ileocecal valve, appendiceal orifice, and                            rectum were photographed. Scope In: 9:03:17 AM Scope Out: 9:21:01 AM Scope Withdrawal Time: 0 hours 12 minutes 15 seconds  Total Procedure Duration: 0 hours 17 minutes 44 seconds  Findings:                 The perianal and digital rectal examinations were                            normal.                           A 2 mm polyp was found in the cecum. The polyp was                            sessile. The polyp was removed with a cold snare.  Resection and retrieval were complete. Estimated                            blood loss was minimal.                           A 4 mm polyp was found in the sigmoid colon. The                            polyp was sessile. The polyp was removed with a                            cold snare. Resection and retrieval were complete.                            Estimated blood loss was minimal.                           Non-bleeding internal hemorrhoids were found during                            retroflexion. The hemorrhoids were small and Grade                            II (internal hemorrhoids that prolapse but reduce                            spontaneously). Complications:            No immediate complications. Estimated Blood Loss:     Estimated blood loss was minimal. Impression:               - One 2 mm polyp in the cecum, removed with a cold                             snare. Resected and retrieved.                           - One 4 mm polyp in the sigmoid colon, removed with                            a cold snare. Resected and retrieved.                           - Non-bleeding internal hemorrhoids. Recommendation:           - Patient has a contact number available for                            emergencies. The signs and symptoms of potential                            delayed complications were discussed with the  patient. Return to normal activities tomorrow.                            Written discharge instructions were provided to the                            patient.                           - Resume previous diet.                           - Continue present medications.                           - Await pathology results.                           - Repeat colonoscopy for surveillance based on                            pathology results.                           - Return to GI clinic PRN. Doristine Locks, MD 11/29/2023 9:26:13 AM

## 2023-11-29 NOTE — Patient Instructions (Signed)
 YOU HAD AN ENDOSCOPIC PROCEDURE TODAY AT THE [facility]:   Refer to the procedure report that was given to you for any specific questions about what was found during the examination.  If the procedure report does not answer your questions, please call your gastroenterologist to clarify.  If you requested that your care partner not be given the details of your procedure findings, then the procedure report has been included in a sealed envelope for you to review at your convenience later.  YOU SHOULD EXPECT: Some feelings of bloating in the abdomen. Passage of more gas than usual.  Walking can help get rid of the air that was put into your GI tract during the procedure and reduce the bloating. If you had a lower endoscopy (such as a colonoscopy or flexible sigmoidoscopy) you may notice spotting of blood in your stool or on the toilet paper. If you underwent a bowel prep for your procedure, you may not have a normal bowel movement for a few days.  Please Note:  You might notice some irritation and congestion in your nose or some drainage.  This is from the oxygen used during your procedure.  There is no need for concern and it should clear up in a day or so.  SYMPTOMS TO REPORT IMMEDIATELY:  Following lower endoscopy (colonoscopy or flexible sigmoidoscopy):  Excessive amounts of blood in the stool  Significant tenderness or worsening of abdominal pains  Swelling of the abdomen that is new, acute  Fever of 100F or higher  For urgent or emergent issues, a gastroenterologist can be reached at any hour by calling [phone]. Do not use MyChart messaging for urgent concerns.    DIET:  We do recommend a small meal at first, but then you may proceed to your regular diet.  Drink plenty of fluids but you should avoid alcoholic beverages for 24 hours.  MEDICATIONS: Continue present medications.  FOLLOW UP: Await pathology results. Repeat colonoscopy for surveillance based on pathology  results. Return to the GI clinic as needed.  Please see handouts given to you by your recovery nurse: Polyps, Hemorrhoids.  Thank you for allowing [place] to provide for your healthcare needs today.  ACTIVITY:  You should plan to take it easy for the rest of today and you should NOT DRIVE or use heavy machinery until tomorrow (because of the sedation medicines used during the test).    FOLLOW UP: Our staff will call the number listed on your records the next business day following your procedure.  We will call around 7:15- 8:00 am to check on you and address any questions or concerns that you may have regarding the information given to you following your procedure. If we do not reach you, we will leave a message.     If any biopsies were taken you will be contacted by phone or by letter within the next 1-3 weeks.  Please call us at [phone] if you have not heard about the biopsies in 3 weeks.    SIGNATURES/CONFIDENTIALITY: You and/or your care partner have signed paperwork which will be entered into your electronic medical record.  These signatures attest to the fact that that the information above on your After Visit Summary has been reviewed and is understood.  Full responsibility of the confidentiality of this discharge information lies with you and/or your care-partner.

## 2023-12-02 ENCOUNTER — Telehealth: Payer: Self-pay

## 2023-12-02 NOTE — Telephone Encounter (Signed)
 Attempted f/u call. No answer, left VM.

## 2023-12-03 LAB — SURGICAL PATHOLOGY

## 2023-12-04 ENCOUNTER — Encounter: Payer: Self-pay | Admitting: Gastroenterology

## 2023-12-30 ENCOUNTER — Other Ambulatory Visit (HOSPITAL_BASED_OUTPATIENT_CLINIC_OR_DEPARTMENT_OTHER): Payer: Self-pay

## 2024-01-02 ENCOUNTER — Encounter: Payer: Self-pay | Admitting: Family Medicine

## 2024-01-02 ENCOUNTER — Other Ambulatory Visit: Payer: Self-pay

## 2024-01-02 ENCOUNTER — Encounter: Payer: Self-pay | Admitting: Neurology

## 2024-01-02 ENCOUNTER — Ambulatory Visit: Admitting: Family Medicine

## 2024-01-02 VITALS — BP 122/82 | HR 71 | Ht 62.0 in | Wt 138.0 lb

## 2024-01-02 DIAGNOSIS — M79601 Pain in right arm: Secondary | ICD-10-CM | POA: Diagnosis not present

## 2024-01-02 DIAGNOSIS — R202 Paresthesia of skin: Secondary | ICD-10-CM

## 2024-01-02 DIAGNOSIS — R200 Anesthesia of skin: Secondary | ICD-10-CM

## 2024-01-02 NOTE — Progress Notes (Signed)
   I, Miquel Amen, CMA acting as a scribe for Garlan Juniper, MD.  HELEANA OKELLY is a 68 y.o. female who presents to [facility] at [facility] today for R arm pain. Pt was previously seen by Dr. Alease Hunter on 02/13/22 for chronic R shoulder pain.  Today, pt c/o R arm pain ongoing for a couple months. Pt locates pain to improvement of right shoulder sx, almost 100%. Continues to have n/t from the right elbow to the 3rd finger. Denies neck pain. Occasional night disturbance but sx tend to be better. Doing nerve guide exercises from PT.   Radiates: elbow to 3rd finger Paresthesia: R UE Grip strength: decreased Aggravates: holding up anything with weight, stirring, overhead reaching Treatments tried: HEP from PT  Pertinent review of systems: No fevers or chills  Relevant historical information: Mnire's right ear   Exam:  BP 122/82   Pulse 71   Ht 5\' 2"  (1.575 m)   Wt 138 lb (62.6 kg)   LMP  (LMP Unknown)   SpO2 98%   BMI 25.24 kg/m  General: Well Developed, well nourished, and in no acute distress.   MSK: C-spine: Normal appearing Decreased cervical motion. Negative Spurling's test. Upper extremity strength is intact. Reflexes are intact.  Right elbow normal-appearing Normal motion. Mildly positive Tinel's at cubital tunnel.  Right wrist normal-appearing Negative Tinel's carpal tunnel.  Positive Phalen's test right hand. Grip strength and reflexes and sensation are intact.     Assessment and Plan: 68 y.o. female with right arm paresthesias.  Symptoms are most consistent with carpal tunnel syndrome.  She may have some overlap with cubital tunnel and even cervical radiculopathy.  This has been ongoing for over a year and has not improved with typical conservative management including PT exercises previously taught.  She has changed her sleeping position to mimic what she would get out of a cubital tunnel and carpal tunnel wrist brace.  Plan to  formalize those braces but also proceed to a nerve conduction study to further characterize source of discomfort and pain.  Anticipate recheck after we get results back.   PDMP not reviewed this encounter. Orders Placed This Encounter  Procedures   US  LIMITED JOINT SPACE STRUCTURES UP RIGHT(NO LINKED CHARGES)    Reason for Exam (SYMPTOM  OR DIAGNOSIS REQUIRED):   right arm pain    Preferred imaging location?:   [facility] referral to Neurology    Referral Priority:   Routine    Referral Type:   Consultation    Referral Reason:   Specialty Services Required    Requested Specialty:   Neurology    Number of Visits Requested:   1   NCV with EMG(electromyography)    Standing Status:   Future    Expiration Date:   01/01/2025    Where should this test be performed?:   [facility]   No orders of the defined types were placed in this encounter.    Discussed warning signs or symptoms. Please see discharge instructions. Patient expresses understanding.   The above documentation has been reviewed and is accurate and complete Garlan Juniper, M.D.

## 2024-01-02 NOTE — Patient Instructions (Addendum)
 Thank you for coming in today.   We've placed an order for a nerve conduction study (NCV / EMG) with [facility].   Recommend Carpal Tunnel wrist brace and Cubital Tunnel wrist brace.  See you back to review results for the nerve conduction study.

## 2024-01-25 ENCOUNTER — Other Ambulatory Visit: Payer: Self-pay | Admitting: Physician Assistant

## 2024-01-27 ENCOUNTER — Other Ambulatory Visit (HOSPITAL_BASED_OUTPATIENT_CLINIC_OR_DEPARTMENT_OTHER): Payer: Self-pay

## 2024-01-27 MED ORDER — VALSARTAN 80 MG PO TABS
80.0000 mg | ORAL_TABLET | Freq: Every day | ORAL | 5 refills | Status: DC
  Filled 2024-01-27: qty 30, 30d supply, fill #0
  Filled 2024-02-26: qty 30, 30d supply, fill #1
  Filled 2024-03-27: qty 30, 30d supply, fill #2
  Filled 2024-04-15 – 2024-04-18 (×2): qty 30, 30d supply, fill #3
  Filled 2024-05-25: qty 30, 30d supply, fill #4
  Filled 2024-06-25: qty 30, 30d supply, fill #5

## 2024-02-06 ENCOUNTER — Other Ambulatory Visit: Payer: Self-pay | Admitting: Physician Assistant

## 2024-02-06 DIAGNOSIS — E2839 Other primary ovarian failure: Secondary | ICD-10-CM

## 2024-02-24 ENCOUNTER — Ambulatory Visit (INDEPENDENT_AMBULATORY_CARE_PROVIDER_SITE_OTHER): Admitting: Neurology

## 2024-02-24 DIAGNOSIS — M79601 Pain in right arm: Secondary | ICD-10-CM | POA: Diagnosis not present

## 2024-02-24 DIAGNOSIS — G5601 Carpal tunnel syndrome, right upper limb: Secondary | ICD-10-CM

## 2024-02-24 NOTE — Procedures (Signed)
  [facility]  [address] [place], [address]  [place], [place] [address] Tel: [phone] Fax: [phone] Test Date:  02/24/2024  Patient: Samantha Becker DOB: 15-Oct-1955 Physician: Venetia Potters, MD  Sex: Female Height: 5' 2 Ref Phys: Artist Lloyd, MD  ID#: [id number]   Technician:    History: This is a 68 year old female with right arm pain.  NCV & EMG Findings: Extensive electrodiagnostic evaluation of the right upper limb shows: Right median sensory response shows prolonged distal peak latency (4.7 ms) and reduced amplitude (8 V). Right ulnar and radial sensory responses are within normal limits. Right median (APB) motor response shows prolonged distal onset latency (4.4 ms) and reduced amplitude (4.1 mV). Right ulnar (ADM) motor response is within normal limits. Chronic motor axon loss changes without accompanying active denervation changes are seen in the right abductor pollicis brevis muscle. All other tested muscles are within normal limits.  Impression: This is an abnormal study. The findings are most consistent with the following: Evidence of a right median mononeuropathy at or distal to the wrist, consistent with carpal tunnel syndrome, moderate in degree electrically. No electrodiagnostic evidence of a right ulnar mononeuropathy. No electrodiagnostic evidence of a right cervical (C5-T1) motor radiculopathy.    ___________________________ Venetia Potters, MD    Nerve Conduction Studies Motor Nerve Results    Latency Amplitude F-Lat Segment Distance CV Comment  Site (ms) Norm (mV) Norm (ms)  (cm) (m/s) Norm   Right Median (APB) Motor  Wrist *4.4  < 4.0 *4.1  > 5.0        Elbow 9.2 - 3.8 -  Elbow-Wrist 27 56  > 50   Right Ulnar (ADM) Motor  Wrist 1.88  < 3.1 10.2  > 7.0        Bel elbow 4.9 - 9.8 -  Bel elbow-Wrist 19 63  > 50   Ab elbow 6.5 - 9.5 -  Ab elbow-Bel elbow 10 63 -    Sensory Sites    Neg Peak Lat Amplitude (O-P) Segment Distance Velocity  Comment  Site (ms) Norm (V) Norm  (cm) (ms)   Right Median Sensory  Wrist-Dig II *4.7  < 3.8 *8  > 10 Wrist-Dig II 13    Right Radial Sensory  Forearm-Wrist 2.1  < 2.8 24  > 10 Forearm-Wrist 10    Right Ulnar Sensory  Wrist-Dig V 2.5  < 3.2 28  > 5 Wrist-Dig V 11     Electromyography   Side Muscle Ins.Act Fibs Fasc Recrt Amp Dur Poly Activation Comment  Right FDI Nml Nml Nml Nml Nml Nml Nml Nml N/A  Right EIP Nml Nml Nml Nml Nml Nml Nml Nml N/A  Right Pronator teres Nml Nml Nml Nml Nml Nml Nml Nml N/A  Right APB Nml Nml Nml *1- *1+ *1+ *1+ Nml N/A  Right Biceps Nml Nml Nml Nml Nml Nml Nml Nml N/A  Right Triceps Nml Nml Nml Nml Nml Nml Nml Nml N/A  Right Deltoid Nml Nml Nml Nml Nml Nml Nml Nml N/A      Waveforms:  Motor      Sensory

## 2024-02-25 ENCOUNTER — Ambulatory Visit: Payer: Self-pay | Admitting: Family Medicine

## 2024-02-25 NOTE — Progress Notes (Signed)
 Nerve conduction study shows medium carpal tunnel syndrome.  This is potentially treatable with an injection especially if not better with carpal tunnel wrist brace. Recommend recheck in clinic to go over the results in full detail especially if not feeling better.  If feeling better continue using the carpal tunnel wrist brace at bedtime and check back as needed.

## 2024-02-26 ENCOUNTER — Other Ambulatory Visit (HOSPITAL_BASED_OUTPATIENT_CLINIC_OR_DEPARTMENT_OTHER): Payer: Self-pay

## 2024-03-05 NOTE — Progress Notes (Signed)
 I, Leotis Batter, CMA acting as a Neurosurgeon for Artist Lloyd, MD.  Samantha Becker is a 68 y.o. female who presents to [facility] at [facility] today for f/u R arm pain w/ NCV study review. Pt was last seen by Dr. Lloyd on 01/02/24 and referred to neurology for testing. Pt advised to wear carpal tunnel wrist brace.  Today, pt reports continued pain, n/t, worsening in severity and intensity over time. Review EMG / NCV today. Would like to discuss injection.   Dx testing: 02/24/24 UE NCV study  Pertinent review of systems: no fever or chills  Relevant historical information: Carpal tunnel syndrome   Exam:  BP 120/84   Pulse 72   Ht 5' 2 (1.575 m)   Wt 138 lb (62.6 kg)   LMP  (LMP Unknown)   SpO2 99%   BMI 25.24 kg/m  General: Well Developed, well nourished, and in no acute distress.   MSK: Right wrist: Normal appearing.  Normal motion.  Intact strength.  Positive tinnels    Lab and Radiology Results  NCV & EMG Findings: 02/24/24 Extensive electrodiagnostic evaluation of the right upper limb shows: Right median sensory response shows prolonged distal peak latency (4.7 ms) and reduced amplitude (8 V). Right ulnar and radial sensory responses are within normal limits. Right median (APB) motor response shows prolonged distal onset latency (4.4 ms) and reduced amplitude (4.1 mV). Right ulnar (ADM) motor response is within normal limits. Chronic motor axon loss changes without accompanying active denervation changes are seen in the right abductor pollicis brevis muscle. All other tested muscles are within normal limits.   Impression: This is an abnormal study. The findings are most consistent with the following: Evidence of a right median mononeuropathy at or distal to the wrist, consistent with carpal tunnel syndrome, moderate in degree electrically. No electrodiagnostic evidence of a right ulnar mononeuropathy. No electrodiagnostic evidence of a right  cervical (C5-T1) motor radiculopathy.   Carpal tunnel median nerve hydrodissection Procedure: Real-time Ultrasound Guided median nerve hydrodissection and carpal tunnel injection right Device: Philips Affiniti 50G/GE Logiq Images permanently stored and available for review in PACS Verbal informed consent obtained.  Discussed risks and benefits of procedure. Warned about infection, bleeding, hyperglycemia damage to structures among others. Patient expresses understanding and agreement Time-out conducted.   Noted no overlying erythema, induration, or other signs of local infection.   Skin prepped in a sterile fashion.   Local anesthesia: Topical Ethyl chloride.   With sterile technique and under real time ultrasound guidance: 40 mg of Kenalog  and 1 mL of lidocaine  injected into carpal tunnel around the median nerve. Fluid seen entering the carpal tunnel.   Completed without difficulty   Pain immediately resolved suggesting accurate placement of the medication.   Advised to call if fevers/chills, erythema, induration, drainage, or persistent bleeding.   Images permanently stored and available for review in the ultrasound unit.  Impression: Technically successful ultrasound guided injection.   Assessment and Plan: 68 y.o. female with Right Carpal tunnel syndrome. Plan for injection today and continued carpal tunnel wrist brace. If not much better next step would be hand surgery referral.    PDMP not reviewed this encounter. Orders Placed This Encounter  Procedures   US  LIMITED JOINT SPACE STRUCTURES UP RIGHT(NO LINKED CHARGES)    Reason for Exam (SYMPTOM  OR DIAGNOSIS REQUIRED):   right wrist pain    Preferred imaging location?:   [facility]   No orders of the  defined types were placed in this encounter.    Discussed warning signs or symptoms. Please see discharge instructions. Patient expresses understanding.   The above documentation has been reviewed and  is accurate and complete Artist Lloyd, M.D.

## 2024-03-06 ENCOUNTER — Ambulatory Visit (INDEPENDENT_AMBULATORY_CARE_PROVIDER_SITE_OTHER): Admitting: Family Medicine

## 2024-03-06 ENCOUNTER — Encounter: Payer: Self-pay | Admitting: Family Medicine

## 2024-03-06 ENCOUNTER — Other Ambulatory Visit: Payer: Self-pay

## 2024-03-06 VITALS — BP 120/84 | HR 72 | Ht 62.0 in | Wt 138.0 lb

## 2024-03-06 DIAGNOSIS — G5601 Carpal tunnel syndrome, right upper limb: Secondary | ICD-10-CM | POA: Diagnosis not present

## 2024-03-06 NOTE — Patient Instructions (Addendum)
 Thank you for coming in today.   You received an injection today. Seek immediate medical attention if the joint becomes red, extremely painful, or is oozing fluid.   See you back as needed.

## 2024-03-27 ENCOUNTER — Other Ambulatory Visit (HOSPITAL_BASED_OUTPATIENT_CLINIC_OR_DEPARTMENT_OTHER): Payer: Self-pay

## 2024-04-14 ENCOUNTER — Encounter: Payer: Self-pay | Admitting: Family Medicine

## 2024-04-14 DIAGNOSIS — G5601 Carpal tunnel syndrome, right upper limb: Secondary | ICD-10-CM

## 2024-04-14 NOTE — Telephone Encounter (Signed)
 Pt had right carpal tunnel injection 03/06/24. I believe that this may be too soon to repeat the injection, forwarding to Dr. Joane to review when he returns to the office.   Referral placed to see Dr. Anshul Agarwala for consult for carpal tunnel surgery.

## 2024-04-15 ENCOUNTER — Other Ambulatory Visit (HOSPITAL_BASED_OUTPATIENT_CLINIC_OR_DEPARTMENT_OTHER): Payer: Self-pay

## 2024-05-15 ENCOUNTER — Encounter: Payer: Self-pay | Admitting: Physician Assistant

## 2024-05-18 ENCOUNTER — Ambulatory Visit (INDEPENDENT_AMBULATORY_CARE_PROVIDER_SITE_OTHER): Admitting: Orthopedic Surgery

## 2024-05-18 DIAGNOSIS — G5601 Carpal tunnel syndrome, right upper limb: Secondary | ICD-10-CM | POA: Diagnosis not present

## 2024-05-18 NOTE — Progress Notes (Signed)
 Samantha Becker - 68 y.o. female MRN [id number]  Date of birth: 21-Jul-1956  Office Visit Note: Visit Date: 05/18/2024 PCP: Job Lukes, PA Referred by: Joane Artist RAMAN, MD  Subjective: No chief complaint on file.  HPI: Samantha Becker is a pleasant 68 y.o. female who presents today for evaluation of right-sided carpal tunnel syndrome refractory to conservative care.  She has trialed activity modification, wrist bracing and prior injection without lasting relief.  Symptoms have been present now for 2 years, worsening in nature.  Has ongoing nocturnal symptoms as well.  Numbness and tingling is isolated to the right side, she does have both clinical electrodiagnostic evidence to confirm this diagnosis.  She is here today for specific hand surgical evaluation.  Pertinent ROS were reviewed with the patient and found to be negative unless otherwise specified above in HPI.   Visit Reason: Right carpal tunnel syndrome Duration of symptoms: 2 years Hand dominance: right Occupation: retired Diabetic: No Smoking: No Heart/Lung History: none Blood Thinners: none  Prior Testing/EMG: EMG Injections (Date): sometime late August, lasted 3 weeks Treatments: wrist brace Prior Surgery: none  Assessment & Plan: Visit Diagnoses:  1. Carpal tunnel syndrome, right upper limb     Plan: Extensive discussion was had with the patient today about her ongoing right sided carpal tunnel syndrome that is refractory to conservative care.  Patient has both clinical and electrodiagnostic evidence to confirm this diagnosis.  At this juncture, she is indicated for right open versus endoscopic carpal tunnel release.  Risks and benefits of both operations were discussed in detail today.  Understanding all risks and benefits, patient would like to have surgery done in the form of right open carpal tunnel release under local anesthesia in the office.  Risks include but not limited to infection,  bleeding, scarring, stiffness, nerve injury or vascular, tendon injury, risk of recurrence and need for subsequent operation were all discussed in detail.  Patient consented understanding the above.  Will move forward surgical scheduling.   Follow-up: No follow-ups on file.   Meds & Orders: No orders of the defined types were placed in this encounter.  No orders of the defined types were placed in this encounter.    Procedures: No procedures performed      Clinical History: No specialty comments available.  She reports that she has never smoked. She has never used smokeless tobacco. No results for input(s): HGBA1C, LABURIC in the last 8760 hours.  Objective:   Vital Signs: LMP  (LMP Unknown)   Physical Exam  Gen: Well-appearing, in no acute distress; non-toxic CV: Regular Rate. Well-perfused. Warm.  Resp: Breathing unlabored on room air; no wheezing. Psych: Fluid speech in conversation; appropriate affect; normal thought process  Ortho Exam PHYSICAL EXAM:  General: Patient is well appearing and in no distress.   Skin and Muscle: No skin changes are apparent to upper extremities.   Range of Motion and Palpation Tests: Mobility is full about the elbows with flexion and extension. Forearm supination and pronation are 85/85 bilaterally.  Wrist flexion/extension is 75/65 bilaterally.  Digital flexion and extension are full.  Thumb opposition is full to the base of the small fingers bilaterally.    No cords or nodules are palpated.  No triggering is observed.     Neurologic, Vascular, Motor: Sensation is diminished to light touch in the right median nerve distribution.    Thenar atrophy: Mild right Tinel sign: Positive right carpal Carpal tunnel compression: Positive right Phalen test:  Positive right  Sensory right hand 2-point discrimination (thumb, index, middle): 8 mm  Motor right hand FPL: 5/5 Index FDP: 5/5 APB: 4+/5 Grip strength Jamar 2, Right 45, Left  40  Fingers pink and well perfused.  Capillary refill is brisk.     No results found for: HGBA1C   Imaging: No results found.  Past Medical/Family/Surgical/Social History: Medications & Allergies reviewed per EMR, new medications updated. Patient Active Problem List   Diagnosis Date Noted   Carpal tunnel syndrome of right wrist 03/06/2024   Special screening for malignant neoplasms, colon 09/17/2023   Osteoarthritis of AC (acromioclavicular) joint    Endolymphatic hydrops of right ear 08/18/2020   Right-sided sensorineural hearing loss 08/18/2020   Atrophic vaginitis 03/20/2017   Endometriosis 03/18/2012   Dyspareunia 02/01/2012   Past Medical History:  Diagnosis Date   Arthritis    Chicken pox    GERD (gastroesophageal reflux disease)    OTC   Hypertension    Leaking of urine    Low iron    Mumps    Family History  Problem Relation Age of Onset   Heart disease Mother    Hypertension Mother    Arthritis Mother    Breast cancer Mother    Osteoporosis Mother    Heart failure Mother    Heart disease Father    Hypertension Father    Cancer Father    Stroke Father    Arthritis Sister    Asthma Sister    Colon cancer Neg Hx    Esophageal cancer Neg Hx    Rectal cancer Neg Hx    Stomach cancer Neg Hx    Colon polyps Neg Hx    Past Surgical History:  Procedure Laterality Date   CESAREAN SECTION     4 cs   LAPAROSCOPY  03/04/2012   Procedure: LAPAROSCOPY OPERATIVE;  Surgeon: [name], MD;  Location: [facility];  Service: Gynecology;  Laterality: N/A;  Peritoneum Biopsy   SHOULDER ARTHROSCOPY Right 03/20/2022   Procedure: RIGHT SHOULDER ARTHROSCOPY WITH DISTAL CLAVICLE RESECTION;  Surgeon: [name], MD;  Location: [facility];  Service: Orthopedics;  Laterality: Right;   TUBAL LIGATION Bilateral    Social History   Occupational History   Occupation: TEACHER     Employer: [employer]  Tobacco Use   Smoking status: Never    Smokeless tobacco: Never  Vaping Use   Vaping status: Never Used  Substance and Sexual Activity   Alcohol use: Yes    Alcohol/week: 4.0 standard drinks of alcohol    Types: 2 Glasses of wine, 2 Cans of beer per week    Comment: wine 3-4 times a week   Drug use: No   Sexual activity: Yes    Birth control/protection: Surgical, Post-menopausal    Comment: BTL    Ayianna Darnold Afton Alderton, M.D. [facility], Hand Surgery

## 2024-05-19 ENCOUNTER — Other Ambulatory Visit: Payer: Medicare Other

## 2024-05-22 ENCOUNTER — Ambulatory Visit
Admission: RE | Admit: 2024-05-22 | Discharge: 2024-05-22 | Disposition: A | Discharge status: Home / Self Care | Payer: Medicare Other | Source: Ambulatory Visit | Attending: Physician Assistant | Admitting: Physician Assistant

## 2024-05-22 ENCOUNTER — Other Ambulatory Visit: Payer: Medicare Other

## 2024-05-22 ENCOUNTER — Ambulatory Visit: Payer: Self-pay | Admitting: Physician Assistant

## 2024-05-22 ENCOUNTER — Encounter: Payer: Self-pay | Admitting: Physician Assistant

## 2024-05-22 ENCOUNTER — Ambulatory Visit: Payer: Medicare Other | Admitting: Physician Assistant

## 2024-05-22 VITALS — BP 138/80 | HR 66 | Temp 97.6°F | Ht 61.5 in | Wt 135.2 lb

## 2024-05-22 DIAGNOSIS — M79662 Pain in left lower leg: Secondary | ICD-10-CM

## 2024-05-22 DIAGNOSIS — E782 Mixed hyperlipidemia: Secondary | ICD-10-CM | POA: Diagnosis not present

## 2024-05-22 DIAGNOSIS — I10 Essential (primary) hypertension: Secondary | ICD-10-CM | POA: Diagnosis not present

## 2024-05-22 DIAGNOSIS — Z23 Encounter for immunization: Secondary | ICD-10-CM | POA: Diagnosis not present

## 2024-05-22 DIAGNOSIS — Z1231 Encounter for screening mammogram for malignant neoplasm of breast: Secondary | ICD-10-CM

## 2024-05-22 LAB — CBC WITH DIFFERENTIAL/PLATELET
Basophils Absolute: 0 K/uL (ref 0.0–0.1)
Basophils Relative: 0.6 % (ref 0.0–3.0)
Eosinophils Absolute: 0.1 K/uL (ref 0.0–0.7)
Eosinophils Relative: 1.1 % (ref 0.0–5.0)
HCT: 39.9 % (ref 36.0–46.0)
Hemoglobin: 13.5 g/dL (ref 12.0–15.0)
Lymphocytes Relative: 30.7 % (ref 12.0–46.0)
Lymphs Abs: 1.9 K/uL (ref 0.7–4.0)
MCHC: 33.7 g/dL (ref 30.0–36.0)
MCV: 87 fl (ref 78.0–100.0)
Monocytes Absolute: 0.6 K/uL (ref 0.1–1.0)
Monocytes Relative: 8.9 % (ref 3.0–12.0)
Neutro Abs: 3.6 K/uL (ref 1.4–7.7)
Neutrophils Relative %: 58.7 % (ref 43.0–77.0)
Platelets: 277 K/uL (ref 150.0–400.0)
RBC: 4.59 Mil/uL (ref 3.87–5.11)
RDW: 13.8 % (ref 11.5–15.5)
WBC: 6.2 K/uL (ref 4.0–10.5)

## 2024-05-22 LAB — LIPID PANEL
Cholesterol: 206 mg/dL — ABNORMAL HIGH (ref 0–200)
HDL: 50 mg/dL (ref >39.00)
LDL Cholesterol: 130 mg/dL — ABNORMAL HIGH (ref 0–99)
NonHDL: 156.49
Total CHOL/HDL Ratio: 4
Triglycerides: 130 mg/dL (ref 0.0–149.0)
VLDL: 26 mg/dL (ref 0.0–40.0)

## 2024-05-22 LAB — COMPREHENSIVE METABOLIC PANEL WITH GFR
ALT: 16 U/L (ref 0–35)
AST: 16 U/L (ref 0–37)
Albumin: 4.6 g/dL (ref 3.5–5.2)
Alkaline Phosphatase: 75 U/L (ref 39–117)
BUN: 12 mg/dL (ref 6–23)
CO2: 28 meq/L (ref 19–32)
Calcium: 9.6 mg/dL (ref 8.4–10.5)
Chloride: 101 meq/L (ref 96–112)
Creatinine, Ser: 0.68 mg/dL (ref 0.40–1.20)
GFR: 90.85 mL/min (ref >60.00)
Glucose, Bld: 82 mg/dL (ref 70–99)
Potassium: 3.8 meq/L (ref 3.5–5.1)
Sodium: 136 meq/L (ref 135–145)
Total Bilirubin: 0.5 mg/dL (ref 0.2–1.2)
Total Protein: 7.4 g/dL (ref 6.0–8.3)

## 2024-05-22 NOTE — Progress Notes (Signed)
 Subjective:    Samantha Becker is a 68 y.o. female and is here for a comprehensive physical exam.  HPI  Health Maintenance Due  Topic Date Due   DEXA SCAN  Never done   Influenza Vaccine  03/27/2024   Medicare Annual Wellness (AWV)  05/21/2024    Discussed the use of AI scribe software for clinical note transcription with the patient, who gave verbal consent to proceed.  History of Present Illness   Samantha Becker is a 68 year old female who presents for evaluation of chronic medical issues, and leg pain.  She experiences aching pain in her left shin when stretching her legs at night or while sitting. The pain is not tender unless present and does not disturb her sleep. It occurs almost nightly and has persisted for six months to a year. She has not attempted treatments such as elevation, compression, or topical rubs and has no vein issues in her legs.  She is awaiting carpal tunnel surgery for ongoing nerve issues. She has not undergone this surgery before.  Her medications include blood pressure medication, vaginal estrogen cream, and tretinoin. She takes calcium and vitamin D supplements. She engages in yoga and walking but has stopped aqua aerobics due to carpal tunnel syndrome. She consumes wine rarely, up to four glasses a week, and does not smoke. Her sleep quality is variable, and she uses melatonin 10 mg as needed. She avoids wine late in the evening to improve sleep.  She denies chest pain, unexplained shortness of breath, unexplained headaches, cold feet, discolored toes, and generalized swelling in her lower legs. She experiences usual arthritis and carpal tunnel symptoms but no tremor. She has no gastrointestinal issues, post-colonoscopy bowel changes, or vaginal bleeding.        Health Maintenance: Immunizations -- declined shingles and pneumonia; getting flu today Colonoscopy -- completed this year; UpToDate  Mammogram -- scheduled PAP -- n/a Bone  Density -- scheduled Diet -- eating overall healthy Exercise -- yoga, walking  Sleep habits -- melatonin prior to sleep at times Mood -- overall stable  UTD with dentist? - yes UTD with eye doctor? - no  Weight history: Wt Readings from Last 10 Encounters:  05/22/24 135 lb 4 oz (61.3 kg)  03/06/24 138 lb (62.6 kg)  01/02/24 138 lb (62.6 kg)  11/29/23 135 lb (61.2 kg)  09/17/23 135 lb (61.2 kg)  07/31/23 134 lb 9.6 oz (61.1 kg)  07/17/23 132 lb (59.9 kg)  06/18/23 132 lb (59.9 kg)  05/22/23 135 lb 6.1 oz (61.4 kg)  03/28/23 134 lb 9.6 oz (61.1 kg)   Body mass index is 25.14 kg/m. No LMP recorded (lmp unknown). Patient is postmenopausal.  Alcohol use:  reports current alcohol use of about 4.0 standard drinks of alcohol per week.  Tobacco use:  Tobacco Use: Low Risk  (05/22/2024)   Patient History    Smoking Tobacco Use: Never    Smokeless Tobacco Use: Never    Passive Exposure: Not on file   Eligible for lung cancer screening? no     05/22/2024    8:26 AM  Depression screen PHQ 2/9  Decreased Interest 0  Down, Depressed, Hopeless 0  PHQ - 2 Score 0     Other providers/specialists: Patient Care Team: Job Lukes, [place] as PCP - General (Physician Assistant)    PMHx, SurgHx, SocialHx, Medications, and Allergies were reviewed in the Visit Navigator and updated as appropriate.   Past Medical History:  Diagnosis Date  Arthritis    Chicken pox    GERD (gastroesophageal reflux disease)    OTC   Hypertension    Leaking of urine    Low iron    Mumps      Past Surgical History:  Procedure Laterality Date   CESAREAN SECTION     4 cs   LAPAROSCOPY  03/04/2012   Procedure: LAPAROSCOPY OPERATIVE;  Surgeon: [name], MD;  Location: [facility];  Service: Gynecology;  Laterality: N/A;  Peritoneum Biopsy   SHOULDER ARTHROSCOPY Right 03/20/2022   Procedure: RIGHT SHOULDER ARTHROSCOPY WITH DISTAL CLAVICLE RESECTION;  Surgeon: [name], MD;  Location:  [facility];  Service: Orthopedics;  Laterality: Right;   TUBAL LIGATION Bilateral      Family History  Problem Relation Age of Onset   Heart disease Mother    Hypertension Mother    Arthritis Mother    Breast cancer Mother    Osteoporosis Mother    Heart failure Mother    Heart disease Father    Hypertension Father    Cancer Father    Stroke Father    Arthritis Sister    Asthma Sister    Colon cancer Neg Hx    Esophageal cancer Neg Hx    Rectal cancer Neg Hx    Stomach cancer Neg Hx    Colon polyps Neg Hx     Social History   Tobacco Use   Smoking status: Never   Smokeless tobacco: Never  Vaping Use   Vaping status: Never Used  Substance Use Topics   Alcohol use: Yes    Alcohol/week: 4.0 standard drinks of alcohol    Types: 2 Glasses of wine, 2 Cans of beer per week    Comment: wine 3-4 times a week   Drug use: No    Review of Systems:   Review of Systems  Constitutional:  Negative for chills, fever, malaise/fatigue and weight loss.  HENT:  Negative for hearing loss, sinus pain and sore throat.   Respiratory:  Negative for cough and hemoptysis.   Cardiovascular:  Negative for chest pain, palpitations, leg swelling and PND.  Gastrointestinal:  Negative for abdominal pain, constipation, diarrhea, heartburn, nausea and vomiting.  Genitourinary:  Negative for dysuria, frequency and urgency.  Musculoskeletal:  Negative for back pain, myalgias and neck pain.  Skin:  Negative for itching and rash.  Neurological:  Negative for dizziness, tingling, seizures and headaches.  Endo/Heme/Allergies:  Negative for polydipsia.  Psychiatric/Behavioral:  Negative for depression. The patient is not nervous/anxious.     Objective:   BP 138/80 (BP Location: Left Arm, Patient Position: Sitting, Cuff Size: Normal)   Pulse 66   Temp 97.6 F (36.4 C) (Temporal)   Ht 5' 1.5 (1.562 m)   Wt 135 lb 4 oz (61.3 kg)   LMP  (LMP Unknown)   SpO2 99%   BMI 25.14 kg/m   Body mass index is 25.14 kg/m.   General Appearance:    Alert, cooperative, no distress, appears stated age  Head:    Normocephalic, without obvious abnormality, atraumatic  Eyes:    PERRL, conjunctiva/corneas clear, EOM's intact, fundi    benign, both eyes  Ears:    Normal TM's and external ear canals, both ears  Nose:   Nares normal, septum midline, mucosa normal, no drainage    or sinus tenderness  Throat:   Lips, mucosa, and tongue normal; teeth and gums normal  Neck:   Supple, symmetrical, trachea midline, no adenopathy;  thyroid:  no enlargement/tenderness/nodules; no carotid   bruit or JVD  Back:     Symmetric, no curvature, ROM normal, no CVA tenderness  Lungs:     Clear to auscultation bilaterally, respirations unlabored  Chest Wall:    No tenderness or deformity   Heart:    Regular rate and rhythm, S1 and S2 normal, no murmur, rub or gallop  Breast Exam:    Deferred  Abdomen:     Soft, non-tender, bowel sounds active all four quadrants,    no masses, no organomegaly  Genitalia:    Deferred   Extremities:   Extremities normal, atraumatic, no cyanosis or edema  Pulses:   2+ and symmetric all extremities  Skin:   Skin color, texture, turgor normal, no rashes or lesions  Lymph nodes:   Cervical, supraclavicular, and axillary nodes normal  Neurologic:   CNII-XII intact, normal strength, sensation and reflexes    throughout    Assessment/Plan:   Assessment and Plan    Left lower leg pain Intermittent pain in the left lower leg during stretching, differential includes vascular, muscle, or bone pain. Consider osteoporosis. No bony tenderness to palpation on exam - Monitor symptoms and observe for changes in vein appearance during pain. - Consider x-ray if symptoms persist or worsen, especially for bone pain. - Consider ultrasound to rule out vascular obstruction if symptoms persist.  Essential hypertension Blood pressure well-managed with current medication. Continue  valsartan  80 mg daily  Hyperlipidemia Cholesterol levels monitored regularly, no new concerns.  Routine health maintenance up to date except for eye doctor appointment. Regular exercise, healthy diet, uses melatonin for sleep, limits alcohol. Scheduled for bone density scan, receiving flu shot today. - Schedule Becker appointment with Becker eye doctor. - Administer flu shot today. - Encourage additional vaccines as needed.          [name], PA-C [facility]

## 2024-05-25 ENCOUNTER — Encounter: Payer: Self-pay | Admitting: Orthopedic Surgery

## 2024-05-25 ENCOUNTER — Ambulatory Visit (INDEPENDENT_AMBULATORY_CARE_PROVIDER_SITE_OTHER)

## 2024-05-25 ENCOUNTER — Other Ambulatory Visit (HOSPITAL_BASED_OUTPATIENT_CLINIC_OR_DEPARTMENT_OTHER): Payer: Self-pay

## 2024-05-25 VITALS — BP 120/82 | HR 65 | Temp 98.3°F | Ht 61.5 in | Wt 138.0 lb

## 2024-05-25 DIAGNOSIS — Z0000 Encounter for general adult medical examination without abnormal findings: Secondary | ICD-10-CM | POA: Diagnosis not present

## 2024-05-25 NOTE — Progress Notes (Signed)
 Subjective:   Samantha Becker is a 68 y.o. who presents for a Medicare Wellness preventive visit.  As a reminder, Annual Wellness Visits don't include a physical exam, and some assessments may be limited, especially if this visit is performed virtually. We may recommend an in-person follow-up visit with your provider if needed.  Visit Complete: In person    Persons Participating in Visit: Patient.  AWV Questionnaire: No: Patient Medicare AWV questionnaire was not completed prior to this visit.  Cardiac Risk Factors include: advanced age (>80men, >44 women);hypertension     Objective:    Today's Vitals   05/25/24 0938  BP: 120/82  Pulse: 65  Temp: 98.3 F (36.8 C)  SpO2: 97%  Weight: 138 lb (62.6 kg)  Height: 5' 1.5 (1.562 m)   Body mass index is 25.65 kg/m.     05/25/2024    9:48 AM 08/29/2023    3:37 PM 07/17/2023    9:28 AM 03/20/2022    6:31 AM 03/13/2022   11:22 AM 02/15/2022    9:06 PM 05/24/2021    9:14 PM  Advanced Directives  Does Patient Have a Medical Advance Directive? No No No No No No No  Would patient like information on creating a medical advance directive? No - Patient declined No - Patient declined  No - Patient declined No - Patient declined No - Patient declined No - Patient declined    Current Medications (verified) Outpatient Encounter Medications as of 05/25/2024  Medication Sig   Calcium Carb-Cholecalciferol (CALCIUM 500 + D PO) Take 2 each by mouth daily in the afternoon.   conjugated estrogens  (PREMARIN ) vaginal cream Premarin  0.625 mg/gram vaginal cream  Insert 0.5 applicatorsful twice a week by vaginal route.   ibuprofen  (ADVIL ) 200 MG tablet Take 800 mg by mouth every 6 (six) hours as needed.   tretinoin (RETIN-A) 0.025 % cream Apply 1 Application topically every evening.   TURMERIC PO Take 3 tablets by mouth daily in the afternoon.   valsartan  (DIOVAN ) 80 MG tablet Take 1 tablet (80 mg total) by mouth daily.   No  facility-administered encounter medications on file as of 05/25/2024.    Allergies (verified) Morphine   History: Past Medical History:  Diagnosis Date   Arthritis    Chicken pox    GERD (gastroesophageal reflux disease)    OTC   Hypertension    Leaking of urine    Low iron    Mumps    Past Surgical History:  Procedure Laterality Date   CESAREAN SECTION     4 cs   LAPAROSCOPY  03/04/2012   Procedure: LAPAROSCOPY OPERATIVE;  Surgeon: [name], MD;  Location: [facility];  Service: Gynecology;  Laterality: N/A;  Peritoneum Biopsy   SHOULDER ARTHROSCOPY Right 03/20/2022   Procedure: RIGHT SHOULDER ARTHROSCOPY WITH DISTAL CLAVICLE RESECTION;  Surgeon: [name], MD;  Location: [facility];  Service: Orthopedics;  Laterality: Right;   TUBAL LIGATION Bilateral    Family History  Problem Relation Age of Onset   Heart disease Mother    Hypertension Mother    Arthritis Mother    Breast cancer Mother    Osteoporosis Mother    Heart failure Mother    Heart disease Father    Hypertension Father    Cancer Father    Stroke Father    Arthritis Sister    Asthma Sister    Colon cancer Neg Hx    Esophageal cancer Neg Hx    Rectal  cancer Neg Hx    Stomach cancer Neg Hx    Colon polyps Neg Hx    Social History   Socioeconomic History   Marital status: Married    Spouse name: Not on file   Number of children: Not on file   Years of education: Not on file   Highest education level: Not on file  Occupational History   Occupation: TEACHER     Employer: [employer]  Tobacco Use   Smoking status: Never   Smokeless tobacco: Never  Vaping Use   Vaping status: Never Used  Substance and Sexual Activity   Alcohol use: Yes    Alcohol/week: 4.0 standard drinks of alcohol    Types: 2 Glasses of wine, 2 Cans of beer per week    Comment: wine 3-4 times a week   Drug use: No   Sexual activity: Yes    Birth control/protection: Surgical, Post-menopausal     Comment: BTL  Other Topics Concern   Not on file  Social History Narrative   Prior Runner, broadcasting/film/video at [employer], now does tutoring -- once a week   Husband and her lives in home   Social Drivers of Health   Financial Resource Strain: Low Risk  (05/25/2024)   Overall Financial Resource Strain (CARDIA)    Difficulty of Paying Living Expenses: Not hard at all  Food Insecurity: No Food Insecurity (05/25/2024)   Hunger Vital Sign    Worried About Running Out of Food in the Last Year: Never true    Ran Out of Food in the Last Year: Never true  Transportation Needs: No Transportation Needs (05/25/2024)   PRAPARE - Administrator, Civil Service (Medical): No    Lack of Transportation (Non-Medical): No  Physical Activity: Sufficiently Active (05/25/2024)   Exercise Vital Sign    Days of Exercise per Week: 4 days    Minutes of Exercise per Session: 50 min  Stress: No Stress Concern Present (05/25/2024)   [employer] of Occupational Health - Occupational Stress Questionnaire    Feeling of Stress: Not at all  Social Connections: Socially Integrated (05/25/2024)   Social Connection and Isolation Panel    Frequency of Communication with Friends and Family: More than three times a week    Frequency of Social Gatherings with Friends and Family: More than three times a week    Attends Religious Services: More than 4 times per year    Active Member of [employer] or Organizations: Yes    Attends Banker Meetings: 1 to 4 times per year    Marital Status: Married    Tobacco Counseling Counseling given: Not Answered    Clinical Intake:  Pre-visit preparation completed: Yes  Pain : No/denies pain     BMI - recorded: 25.65 Nutritional Status: BMI 25 -29 Overweight Nutritional Risks: None Diabetes: No  No results found for: HGBA1C   How often do you need to have someone help you when you read instructions, pamphlets, or other written materials from your doctor or  pharmacy?: 1 - Never  Interpreter Needed?: No  Information entered by :: Ellouise Haws, LPN   Activities of Daily Living     05/25/2024    9:43 AM  In your present state of health, do you have any difficulty performing the following activities:  Hearing? 0  Vision? 0  Difficulty concentrating or making decisions? 0  Walking or climbing stairs? 0  Dressing or bathing? 0  Doing errands, shopping? 0  Preparing Food and eating ? N  Using the Toilet? N  In the past six months, have you accidently leaked urine? Y  Comment werars a pad  Do you have problems with loss of bowel control? N  Managing your Medications? N  Managing your Finances? N  Housekeeping or managing your Housekeeping? N    Patient Care Team: Job Lukes, [place] as PCP - General (Physician Assistant)  I have updated your Care Teams any recent Medical Services you may have received from other providers in the past year.     Assessment:   This is a routine wellness examination for [name]  Hearing/Vision screen Hearing Screening - Comments:: Pt stated slight hearing issues with right ear  Vision Screening - Comments:: Wears rx glasses - up to date with routine eye exams with [facility] [address]    Goals Addressed             This Visit's Progress    Patient Stated       Maintain weight and strengthen hip and gluteus       Depression Screen     05/25/2024    9:45 AM 05/22/2024    8:26 AM 07/31/2023    1:18 PM 07/31/2023   12:59 PM 05/22/2023    8:09 AM 08/24/2022    3:18 PM 04/25/2021    1:28 PM  PHQ 2/9 Scores  PHQ - 2 Score 0 0 0 0 0 0 0  PHQ- 9 Score   0 0  0     Fall Risk     05/25/2024    9:47 AM 05/22/2024    8:26 AM 07/31/2023   12:59 PM 05/22/2023    8:08 AM 03/03/2018    2:02 PM  Fall Risk   Falls in the past year? 0 0 0 0 No   Number falls in past yr: 0 0 0 0   Injury with Fall? 0 0 0 0   Risk for fall due to : No Fall Risks  No Fall Risks    Follow up Falls prevention  discussed Falls evaluation completed Falls evaluation completed;Falls prevention discussed Falls evaluation completed      Data saved with a previous flowsheet row definition    MEDICARE RISK AT HOME:  Medicare Risk at Home Any stairs in or around the home?: Yes If so, are there any without handrails?: No Home free of loose throw rugs in walkways, pet beds, electrical cords, etc?: Yes Adequate lighting in your home to reduce risk of falls?: Yes Life alert?: No Use of a cane, walker or w/c?: No Grab bars in the bathroom?: No Shower chair or bench in shower?: No Elevated toilet seat or a handicapped toilet?: No  TIMED UP AND GO:  Was the test performed?  Yes  Length of time to ambulate 10 feet: 10 sec Gait steady and fast without use of assistive device  Cognitive Function: 6CIT completed        05/25/2024    9:48 AM  6CIT Screen  What Year? 0 points  What month? 0 points  What time? 0 points  Count back from 20 0 points  Months in reverse 0 points  Repeat phrase 0 points  Total Score 0 points    Immunizations Immunization History  Administered Date(s) Administered   Fluad Trivalent(High Dose 65+) 05/22/2023   INFLUENZA, HIGH DOSE SEASONAL PF 05/22/2024   Influenza,inj,Quad PF,6+ Mos 08/07/2016, 06/13/2018, 06/10/2019, 06/07/2022   Influenza-Unspecified 08/07/2016, 07/06/2020  Moderna Sars-Covid-2 Vaccination 10/01/2023   PFIZER(Purple Top)SARS-COV-2 Vaccination 10/22/2019, 11/13/2019, 05/29/2020, 12/24/2020   Pfizer(Comirnaty)Fall Seasonal Vaccine 12 years and older 07/17/2022   Tdap 02/11/2007, 06/13/2018    Screening Tests Health Maintenance  Topic Date Due   DEXA SCAN  Never done   Pneumococcal Vaccine: 50+ Years (1 of 1 - PCV) 05/22/2025 (Originally 02/13/2008)   COVID-19 Vaccine (7 - 2025-26 season) 05/22/2025 (Originally 04/27/2024)   Zoster Vaccines- Shingrix (1 of 2) 05/22/2025 (Originally [birth year]1977)   Mammogram  05/20/2025   Medicare Annual Wellness  (AWV)  05/25/2025   DTaP/Tdap/Td (3 - Td or Tdap) 06/13/2028   Colonoscopy  11/29/2030   Influenza Vaccine  Completed   HPV VACCINES  Aged Out   Meningococcal B Vaccine  Aged Out   Hepatitis C Screening  Discontinued    Health Maintenance Items Addressed: See Nurse Notes at the end of this note  Additional Screening:  Vision Screening: Recommended annual ophthalmology exams for early detection of glaucoma and other disorders of the eye. Is the patient up to date with their annual eye exam?  Yes  Who is the provider or what is the name of the office in which the patient attends annual eye exams? [facility] [address]   Dental Screening: Recommended annual dental exams for proper oral hygiene  Community Resource Referral / Chronic Care Management: CRR required this visit?  No   CCM required this visit?  No   Plan:    I have personally reviewed and noted the following in the patient's chart:   Medical and social history Use of alcohol, tobacco or illicit drugs  Current medications and supplements including opioid prescriptions. Patient is not currently taking opioid prescriptions. Functional ability and status Nutritional status Physical activity Advanced directives List of other physicians Hospitalizations, surgeries, and ER visits in previous 12 months Vitals Screenings to include cognitive, depression, and falls Referrals and appointments  In addition, I have reviewed and discussed with patient certain preventive protocols, quality metrics, and best practice recommendations. A written personalized care plan for preventive services as well as general preventive health recommendations were provided to patient.   Ellouise VEAR Haws, LPN   [id number]   After Visit Summary: (MyChart) Due to this being a telephonic visit, the after visit summary with patients personalized plan was offered to patient via MyChart   Notes: Nothing significant to report at this time.

## 2024-05-25 NOTE — Patient Instructions (Signed)
 Ms. Slee,  Thank you for taking the time for your Medicare Wellness Visit. I appreciate your continued commitment to your health goals. Please review the care plan we discussed, and feel free to reach out if I can assist you further.  Medicare recommends these wellness visits once per year to help you and your care team stay ahead of potential health issues. These visits are designed to focus on prevention, allowing your provider to concentrate on managing your acute and chronic conditions during your regular appointments.  Please note that Annual Wellness Visits do not include a physical exam. Some assessments may be limited, especially if the visit was conducted virtually. If needed, we may recommend a separate in-person follow-up with your provider.  Ongoing Care Seeing your primary care provider every 3 to 6 months helps us  monitor your health and provide consistent, personalized care.   Referrals If a referral was made during today's visit and you haven't received any updates within two weeks, please contact the referred provider directly to check on the status.  Recommended Screenings:  Health Maintenance  Topic Date Due   DEXA scan (bone density measurement)  Never done   Medicare Annual Wellness Visit  05/21/2024   Pneumococcal Vaccine for age over 21 (1 of 1 - PCV) 05/22/2025*   COVID-19 Vaccine (7 - 2025-26 season) 05/22/2025*   Zoster (Shingles) Vaccine (1 of 2) 05/22/2025*   Breast Cancer Screening  05/20/2025   DTaP/Tdap/Td vaccine (3 - Td or Tdap) 06/13/2028   Colon Cancer Screening  11/29/2030   Flu Shot  Completed   HPV Vaccine  Aged Out   Meningitis B Vaccine  Aged Out   Hepatitis C Screening  Discontinued  *Topic was postponed. The date shown is not the original due date.       08/29/2023    3:37 PM  Advanced Directives  Does Patient Have a Medical Advance Directive? No  Would patient like information on creating a medical advance directive? No - Patient  declined   Advance Care Planning is important because it: Ensures you receive medical care that aligns with your values, goals, and preferences. Provides guidance to your family and loved ones, reducing the emotional burden of decision-making during critical moments.  Vision: Annual vision screenings are recommended for early detection of glaucoma, cataracts, and diabetic retinopathy. These exams can also reveal signs of chronic conditions such as diabetes and high blood pressure.  Dental: Annual dental screenings help detect early signs of oral cancer, gum disease, and other conditions linked to overall health, including heart disease and diabetes.  Please see the attached documents for additional preventive care recommendations.

## 2024-05-26 ENCOUNTER — Ambulatory Visit: Payer: Self-pay | Admitting: Physician Assistant

## 2024-05-26 ENCOUNTER — Ambulatory Visit (HOSPITAL_BASED_OUTPATIENT_CLINIC_OR_DEPARTMENT_OTHER)
Admission: RE | Admit: 2024-05-26 | Discharge: 2024-05-26 | Disposition: A | Discharge status: Home / Self Care | Payer: Self-pay | Source: Ambulatory Visit | Attending: Physician Assistant | Admitting: Physician Assistant

## 2024-05-26 DIAGNOSIS — E782 Mixed hyperlipidemia: Secondary | ICD-10-CM | POA: Insufficient documentation

## 2024-05-27 ENCOUNTER — Other Ambulatory Visit: Payer: Self-pay

## 2024-05-27 DIAGNOSIS — G5601 Carpal tunnel syndrome, right upper limb: Secondary | ICD-10-CM

## 2024-05-28 NOTE — Telephone Encounter (Signed)
 Patient has been scheduled for surgery on 06/16/24.

## 2024-06-16 ENCOUNTER — Ambulatory Visit (INDEPENDENT_AMBULATORY_CARE_PROVIDER_SITE_OTHER): Admitting: Orthopedic Surgery

## 2024-06-16 ENCOUNTER — Other Ambulatory Visit: Payer: Self-pay | Admitting: Orthopedic Surgery

## 2024-06-16 DIAGNOSIS — G5601 Carpal tunnel syndrome, right upper limb: Secondary | ICD-10-CM | POA: Diagnosis not present

## 2024-06-16 MED ORDER — ACETAMINOPHEN-CODEINE 300-30 MG PO TABS: 1.0000 | ORAL_TABLET | Freq: Four times a day (QID) | ORAL | 0 refills | Status: AC | PRN

## 2024-06-16 NOTE — Progress Notes (Signed)
 Procedure Note  Patient: Samantha Becker             Date of Birth: 07-31-1956           MRN: [id number]             Visit Date: 06/16/2024  Procedures: Visit Diagnoses:  1. Carpal tunnel syndrome, right upper limb     NAME: OKTOBER GLAZER MEDICAL RECORD NO: [id number] DATE OF BIRTH: 12-26-1955 FACILITY: [facility] LOCATION: [facility] PHYSICIAN: GILDARDO ALDERTON, MD   OPERATIVE REPORT   DATE OF PROCEDURE: 06/16/24    PREOPERATIVE DIAGNOSIS: Right carpal tunnel syndrome   POSTOPERATIVE DIAGNOSIS: Right carpal tunnel syndrome   PROCEDURE: Right open carpal tunnel release   SURGEON:  GILDARDO ALDERTON, M.D.   ASSISTANT: Almeda Rummer, PA   ANESTHESIA:  Local   INTRAVENOUS FLUIDS:  Per anesthesia flow sheet.   ESTIMATED BLOOD LOSS:  Minimal.   COMPLICATIONS:  None.   SPECIMENS:  none   TOURNIQUET TIME: 3 minutes  DISPOSITION:  Stable to PACU.   INDICATIONS: 68 year old female with history of clinical and electrodiagnostic evidence of right-sided carpal tunnel syndrome refractory to conservative care.  Patient was indicated for right open carpal tunnel release.  Patient elected to have surgery performed under local anesthesia in the office setting.  Risks and benefits of surgery were discussed including the risks of infection, bleeding, scarring, stiffness, nerve injury, vascular injury, tendon injury, need for subsequent operation, persistent numbness, recurrence.  She voiced understanding of these risks and elected to proceed.  OPERATIVE COURSE:Patient was seen and identified in the preprocedure area and marked appropriately.  Surgical consent had been signed.  Patient was transferred to the procedure room and placed in supine position with the right upper extremity on an arm board.  10 cc of 1% lidocaine  with epinephrine  was utilized around the planned incisional site.  Right upper extremity was prepped and draped in normal sterile orthopedic  fashion.  A surgical pause was performed between the surgeon and staff, all were in agreement as to the patient, procedure, and site of procedure.  Tourniquet was placed and padded appropriately to the right upper arm.  The arm was exsanguinated and the tourniquet was inflated to 250 mmHg.  Longitudinal incision was designed in the thenar crease in line with the radial border of the ring finger, from intersection of Kaplan's cardinal line down to level of the distal wrist crease. Incision was carried down utilizing 15 blade. Blunt dissection was performed, palmar fascia was identified and incised sharply utilizing a Beaver blade. Careful dissection was performed down, thenar musculature was bluntly elevated and the transcarpal ligament was identified.  A Beaver blade was then utilized to divide the transcarpal ligament in a distal to proximal fashion.  Fat surrounding the palmar arch was encountered to confirm appropriate distal release.  At the level of the wrist crease, skin flaps were elevated to allow for release of the proximal portion of transverse carpal ligament as well as the antebrachial fascia into the forearm. Appropriate decompression was noted of the median nerve, care was taken to protect the nerve in its entirety throughout. Once we were satisfied with our proximal and distal release, tourniquet was deflated and bipolar electrocautery was utilized for hemostasis.  Tourniquet time was 3 minutes.  Copious irrigation was performed followed by closure utilizing 4-0 nylon in standard fashion for the skin surface. Sterile dressings were applied followed by a loosefitting soft hand wrap. Patient was subsequently taken to the  recovery area in stable condition.  Post-operative plan: The patient will recover and then be discharged home.  The patient will be non weight bearing on the right upper extremity in a soft dressing.   I will see the patient back in the office in 2 weeks for postoperative followup.   Discharge instructions were provided for appropriate wound care, dressing maintenance and pain control.   Samantha Harewood, MD Electronically signed, 06/16/24

## 2024-06-25 ENCOUNTER — Other Ambulatory Visit (HOSPITAL_BASED_OUTPATIENT_CLINIC_OR_DEPARTMENT_OTHER): Payer: Self-pay

## 2024-06-29 ENCOUNTER — Encounter: Payer: Self-pay | Admitting: Radiology

## 2024-06-30 ENCOUNTER — Encounter: Admitting: Rehabilitative and Restorative Service Providers"

## 2024-06-30 NOTE — Progress Notes (Unsigned)
   SHAQUIRA MOROZ - 68 y.o. female MRN [id number]  Date of birth: [birth year]  Office Visit Note: Visit Date: 07/01/2024 PCP: Job Lukes, PA Referred by: Job Lukes, [place]  Subjective:  HPI: JAZLYNE GAUGER is a 68 y.o. female who presents today for follow up 2 weeks status post right open carpal tunnel release. ***  Pertinent ROS were reviewed with the patient and found to be negative unless otherwise specified above in HPI.   Assessment & Plan: Visit Diagnoses: No diagnosis found.  Plan: ***  Follow-up: No follow-ups on file.   Meds & Orders: No orders of the defined types were placed in this encounter.  No orders of the defined types were placed in this encounter.    Procedures: No procedures performed       Objective:   Vital Signs: LMP  (LMP Unknown)   Ortho Exam Right hand: - Well-healing palmar incision, sutures removed***, skin edges well-approximated without erythema or drainage - Composite fist without restriction - Sensation intact to light touch in the median nerve distribution - 4+/5 APB mild thenar atrophy    Imaging: No results found.   Anastacia Reinecke Afton Alderton, M.D. [facility], Hand Surgery

## 2024-06-30 NOTE — Therapy (Signed)
 OUTPATIENT OCCUPATIONAL THERAPY ORTHO EVALUATION AND DISCHARGE NOTE  Patient Name: Samantha Becker MRN: [id number] DOB:[birth year], 68 y.o., female Today's Date: 07/01/2024  REFERRING PROVIDER: Arlinda Buster, MD   END OF SESSION:   Past Medical History:  Diagnosis Date   Arthritis    Chicken pox    GERD (gastroesophageal reflux disease)    OTC   Hypertension    Leaking of urine    Low iron    Mumps    Past Surgical History:  Procedure Laterality Date   CESAREAN SECTION     4 cs   LAPAROSCOPY  03/04/2012   Procedure: LAPAROSCOPY OPERATIVE;  Surgeon: [name], MD;  Location: [facility];  Service: Gynecology;  Laterality: N/A;  Peritoneum Biopsy   SHOULDER ARTHROSCOPY Right 03/20/2022   Procedure: RIGHT SHOULDER ARTHROSCOPY WITH DISTAL CLAVICLE RESECTION;  Surgeon: [name], MD;  Location: [facility];  Service: Orthopedics;  Laterality: Right;   TUBAL LIGATION Bilateral    Patient Active Problem List   Diagnosis Date Noted   Carpal tunnel syndrome of right wrist 03/06/2024   Osteoarthritis of AC (acromioclavicular) joint    Endolymphatic hydrops of right ear 08/18/2020   Right-sided sensorineural hearing loss 08/18/2020   Atrophic vaginitis 03/20/2017   Endometriosis 03/18/2012   Dyspareunia 02/01/2012     ONSET DATE:  DOS 06/16/24  REFERRING DIAG: G56.01 (ICD-10-CM) - Carpal tunnel syndrome, right upper limb   THERAPY DIAG:     Localized edema  Muscle weakness (generalized)  Paresthesia of skin  Pain in right hand  Rationale for Evaluation and Treatment: Rehabilitation  SUBJECTIVE:   SUBJECTIVE STATEMENT: The patient states hx of paresthesia and pain in their hand and subsequent surgical release of the carpal tunnel. Now the patient states having some lingering paresthesia, stiffness, pain, decreased ability to perform I/ADLs.     PERTINENT HISTORY: The patient is now approx 2 weeks s/p Rt hand CTR.   PRECAUTIONS:  None relative to this evaluation and episode of care.   RED FLAGS: None   WEIGHT BEARING RESTRICTIONS: Yes: caution with weightbearing for the next 4-6 weeks, recommended less than 5lbs for next 2 weeks with affected hand  PAIN:  Are you having pain? Yes: NPRS scale: mild now at rest  Pain location:  sx area Pain description: aching and sore Aggravating factors: gripping/squeezing Relieving factors: rest  FALLS: Has patient fallen in last 6 months? No, not a fall risk  PLOF: Independent with I/ADLs  PATIENT GOALS: To improve motion, function with affected surgical hand  NEXT MD VISIT: PRN    OBJECTIVE MEASURES:   ADLs: Overall ADLs: States decreased ability to grab, hold household objects, pain and difficulty to open containers, perform FMS tasks (manipulate fasteners on clothing).     UPPER EXTREMITY ROM:     A/ROM Right eval  Wrist flexion 60  Wrist extension 68  (Blank rows = not tested)                    Hand A/ROM Right eval  Full Fist Ability (or Gap to Distal Palmar Crease) Unable due to triggering IF, but other fingers are a full fist  Thumb Opposition  (Kapandji Scale)  10/10  (Blank rows = not tested)   HAND STRENGTH & FUNCTION: Eval: Observed weakness in affected hand/arm, grossly 3-/5 MMT, but specific gripping and resistance training contraindicated today. Also at least mild observed coordination impairments with affected hand/arm due to stiffness and soreness. These deficits are expected  to improve with HEP and recommendations.    COORDINATION: Eval: Mild observed coordination impairments with surgical hand, as seen by pain,stiffness, etc. Expected to improve with HEP and recommendations.   SENSATION: Eval:  Light touch mildly diminished especially through sx area. Expected to improve with HEP and recommendations.   EDEMA:   Eval:  Mildly swollen in surgical hand today.  Expected to improve with HEP and recommendations.   COGNITION: Eval:  Overall cognitive status: WFL for evaluation today   OBSERVATIONS:   Eval: Surgical site is clean and no overt signs of infection, no drainage, signs of dehiscence, etc.  Tenderness and swelling is within normal limits for post-op timeframe.     TODAY'S TREATMENT:  Post-evaluation treatment:   The patient was given safety information for managing post-op wound, including not to soak wound, to keep clean and dry, to start with gentle scar mobilizations approx 3-5 days after stitches are removed and if the wound is closed. The patient was supplied with compressive gauze to help with swelling as needed.  The patient should contact the surgeon with any concerns immediately.   The patient should also avoid any strong gripping, push, pull, weight bearing or repetitive motion for the next month.  The patient  should not be doing painful activities.  The patient should not rest on their palm, keep the wrist bent for long time periods, or sleep on their hand. After a month, the patient can progressively return to all light, normal activities. Sports and heavy weight lifting should be withheld for a total of 3 months.   The patient was also educated (explanation and demonstration) on the following home exercise program including tolerable range of motion, gentle passive range of motion, scar care, progressive desensitization, prevention of soft tissue contractures, etc. the patient was also given management techniques for thumb arthritis and managing index finger trigger finger.  The patient states understanding all directions and feels comfortable with doing this at home, self-management, and following up with the surgeon as needed/scheduled.    Exercises - Turn [employer] Facing Up & Down  - 4 x daily - 15 reps - Bend and Pull Back Wrist SLOWLY  - 4 x daily - 15 reps - Tendon Glides  - 4 x daily - 5 reps - 3 second hold - Thumb Opposition  - 4-6 x daily - 10 reps - Median Nerve Flossing  - 3 x daily - 5  reps - Wrist Prayer Stretch  - 4 x daily - 3 reps - 15 second hold - Stretch thumb toward base of small finger (put hand in LAP)  - 2-3 x daily - 3-5 reps - 15 sec hold Patient Education - Scar Massage    PATIENT EDUCATION: Education details: See tx section above for details  Person educated: Patient Education method: Verbal Instruction, Teach back, Handouts  Education comprehension: States and demonstrates understanding   HOME EXERCISE PROGRAM: Access Code: [email or web address] URL: [email or web address] Date: 07/01/2024 Prepared by: Melvenia Ada   GOALS: Goals reviewed with patient? Yes   SHORT TERM GOALS: (STG required if POC>30 days) Target Date: 07/01/24  1.  Pt will demo/state understanding of initial HEP and therapist recommendations to improve pain levels, improve motions and ability and eventually return to normal activities.   Goal status: MET    ASSESSMENT:  CLINICAL IMPRESSION: Patient is a 68 y.o. female who was seen today for occupational therapy evaluation for swelling, pain, weakness and decreased functional ability following carpal tunnel release procedure. The patient  is appropriate for OT rehab services and benefited from treatment today. The patient got copious education/treatment today for self-care, wound management, exercises and how to transition to normal activities in the next 4-6 weeks. The patient agrees that they can manage these recommendations independently, and should not need to return for follow up visits. The patient should follow up with the surgeon with any concerns, and could possibly return to therapy, if needed, with a new order.  The patient will discharge therapy treatment after this visit.     PERFORMANCE DEFICITS: in functional skills including ADLs, IADLs, coordination, dexterity, sensation, edema, ROM, strength, pain, fascial restrictions, flexibility, Fine motor control, body mechanics, endurance, decreased knowledge of  precautions, wound, and UE functional use, cognitive skills including problem solving and safety awareness, and psychosocial skills including coping strategies, environmental adaptation, and habits.   IMPAIRMENTS: are limiting patient from ADLs, IADLs, rest and sleep, leisure, and social participation.   COMORBIDITIES: may have co-morbidities  that affects occupational performance. Patient will benefit from skilled OT to address above impairments and improve overall function.  MODIFICATION OR ASSISTANCE TO COMPLETE EVALUATION: No modification of tasks or assist necessary to complete an evaluation.  OT OCCUPATIONAL PROFILE AND HISTORY: Problem focused assessment: Including review of records relating to presenting problem.  CLINICAL DECISION MAKING: LOW - limited treatment options, no task modification necessary  REHAB POTENTIAL: Excellent  EVALUATION COMPLEXITY: Low      PLAN:  OT FREQUENCY: one time visit  OT DURATION: 1 sessions  PLANNED INTERVENTIONS: self care/ADL training, therapeutic exercise, therapeutic activity, neuromuscular re-education, manual therapy, scar mobilization, passive range of motion, splinting, ultrasound, fluidotherapy, compression bandaging, moist heat, cryotherapy, contrast bath, patient/family education, energy conservation, coping strategies training, and Re-evaluation  RECOMMENDED OTHER SERVICES: none now   CONSULTED AND AGREED WITH PLAN OF CARE: Patient  PLAN FOR NEXT SESSION:   N/A    Melvenia Ada, OTR/L, CHT 07/01/2024, 10:52 AM

## 2024-07-01 ENCOUNTER — Ambulatory Visit (INDEPENDENT_AMBULATORY_CARE_PROVIDER_SITE_OTHER): Admitting: Orthopedic Surgery

## 2024-07-01 ENCOUNTER — Ambulatory Visit (INDEPENDENT_AMBULATORY_CARE_PROVIDER_SITE_OTHER): Admitting: Rehabilitative and Restorative Service Providers"

## 2024-07-01 ENCOUNTER — Encounter: Payer: Self-pay | Admitting: Rehabilitative and Restorative Service Providers"

## 2024-07-01 DIAGNOSIS — M6281 Muscle weakness (generalized): Secondary | ICD-10-CM

## 2024-07-01 DIAGNOSIS — R202 Paresthesia of skin: Secondary | ICD-10-CM

## 2024-07-01 DIAGNOSIS — M79641 Pain in right hand: Secondary | ICD-10-CM

## 2024-07-01 DIAGNOSIS — R600 Localized edema: Secondary | ICD-10-CM

## 2024-07-01 DIAGNOSIS — Z9889 Other specified postprocedural states: Secondary | ICD-10-CM

## 2024-07-03 ENCOUNTER — Encounter: Payer: Self-pay | Admitting: Orthopedic Surgery

## 2024-07-20 ENCOUNTER — Other Ambulatory Visit: Payer: Self-pay | Admitting: Physician Assistant

## 2024-07-20 ENCOUNTER — Other Ambulatory Visit (HOSPITAL_BASED_OUTPATIENT_CLINIC_OR_DEPARTMENT_OTHER): Payer: Self-pay

## 2024-07-20 ENCOUNTER — Other Ambulatory Visit: Payer: Self-pay

## 2024-07-20 MED ORDER — VALSARTAN 80 MG PO TABS
80.0000 mg | ORAL_TABLET | Freq: Every day | ORAL | 2 refills | Status: AC
  Filled 2024-07-20: qty 30, 30d supply, fill #0
  Filled 2024-08-25: qty 30, 30d supply, fill #1
  Filled 2024-09-29: qty 30, 30d supply, fill #2

## 2024-07-28 ENCOUNTER — Ambulatory Visit (HOSPITAL_BASED_OUTPATIENT_CLINIC_OR_DEPARTMENT_OTHER)
Admission: RE | Admit: 2024-07-28 | Discharge: 2024-07-28 | Disposition: A | Source: Ambulatory Visit | Attending: Physician Assistant

## 2024-07-28 DIAGNOSIS — E2839 Other primary ovarian failure: Secondary | ICD-10-CM | POA: Diagnosis present

## 2024-07-29 ENCOUNTER — Ambulatory Visit: Payer: Self-pay | Admitting: Physician Assistant

## 2024-08-03 ENCOUNTER — Ambulatory Visit: Admitting: Orthopedic Surgery

## 2024-08-03 DIAGNOSIS — Z9889 Other specified postprocedural states: Secondary | ICD-10-CM

## 2024-08-03 NOTE — Progress Notes (Signed)
   Samantha Becker - 68 y.o. female MRN [id number]  Date of birth: 1955/12/17  Office Visit Note: Visit Date: 08/03/2024 PCP: Samantha Lukes, PA Referred by: Samantha Becker, Samantha Becker  Subjective:  HPI: Samantha Becker is a 68 y.o. female who presents today for follow up 7 weeks status post right wrist open carpal tunnel release.  She is doing very well overall, range of motion has progressed nicely.  Does have some ongoing numbness in the distal aspect of the index and long finger.  Is pleased with her progress thus far.  Pertinent ROS were reviewed with the patient and found to be negative unless otherwise specified above in HPI.   Assessment & Plan: Visit Diagnoses:  1. S/P carpal tunnel release     Plan: She is doing quite well postoperatively.  Continue with activities as tolerated moving forward.  Her range of motion is excellent.  We once again did discuss the slow nature of nerve recovery and regeneration after carpal tunnel release.  She can return to me as needed moving forward in the future.  Follow-up: No follow-ups on file.   Meds & Orders: No orders of the defined types were placed in this encounter.  No orders of the defined types were placed in this encounter.    Procedures: No procedures performed       Objective:   Vital Signs: LMP  (LMP Unknown)   Ortho Exam Right hand: - Well-healed palmar incision, skin edges well-approximated without erythema or drainage - Composite fist without restriction - Sensation intact to light touch in the median nerve distribution, remains slightly diminished at the distal aspect of the index and long finger - 5/5 APB no thenar atrophy    Imaging: No results found.   Samantha Becker Afton Alderton, M.D. [facility], Hand Surgery

## 2024-08-25 ENCOUNTER — Other Ambulatory Visit (HOSPITAL_BASED_OUTPATIENT_CLINIC_OR_DEPARTMENT_OTHER): Payer: Self-pay

## 2024-09-29 ENCOUNTER — Other Ambulatory Visit (HOSPITAL_BASED_OUTPATIENT_CLINIC_OR_DEPARTMENT_OTHER): Payer: Self-pay

## 2025-05-31 ENCOUNTER — Ambulatory Visit
